# Patient Record
Sex: Female | Born: 1955
Health system: Southern US, Community
[De-identification: ages and names within clinical notes are randomized; demographics above are authoritative.]

## PROBLEM LIST (undated history)

## (undated) DIAGNOSIS — M549 Dorsalgia, unspecified: Secondary | ICD-10-CM

## (undated) DIAGNOSIS — F419 Anxiety disorder, unspecified: Secondary | ICD-10-CM

## (undated) DIAGNOSIS — Z98891 History of uterine scar from previous surgery: Secondary | ICD-10-CM

## (undated) DIAGNOSIS — R51 Headache: Secondary | ICD-10-CM

## (undated) DIAGNOSIS — E7800 Pure hypercholesterolemia, unspecified: Secondary | ICD-10-CM

## (undated) DIAGNOSIS — K219 Gastro-esophageal reflux disease without esophagitis: Secondary | ICD-10-CM

## (undated) DIAGNOSIS — G629 Polyneuropathy, unspecified: Secondary | ICD-10-CM

## (undated) DIAGNOSIS — M797 Fibromyalgia: Secondary | ICD-10-CM

## (undated) DIAGNOSIS — G4733 Obstructive sleep apnea (adult) (pediatric): Secondary | ICD-10-CM

## (undated) DIAGNOSIS — G4730 Sleep apnea, unspecified: Secondary | ICD-10-CM

## (undated) DIAGNOSIS — I10 Essential (primary) hypertension: Secondary | ICD-10-CM

## (undated) DIAGNOSIS — M5430 Sciatica, unspecified side: Secondary | ICD-10-CM

## (undated) DIAGNOSIS — M2550 Pain in unspecified joint: Secondary | ICD-10-CM

## (undated) HISTORY — DX: Sleep apnea, unspecified: G47.30

## (undated) HISTORY — DX: Pain in unspecified joint: M25.50

## (undated) HISTORY — PX: CHOLECYSTECTOMY: SHX55

## (undated) HISTORY — PX: OTHER SURGICAL HISTORY: SHX169

## (undated) HISTORY — DX: Sciatica, unspecified side: M54.30

## (undated) HISTORY — DX: Pure hypercholesterolemia, unspecified: E78.00

## (undated) HISTORY — DX: Dorsalgia, unspecified: M54.9

## (undated) HISTORY — DX: Polyneuropathy, unspecified: G62.9

---

## 1998-10-05 ENCOUNTER — Ambulatory Visit (HOSPITAL_COMMUNITY): Admission: RE | Admit: 1998-10-05 | Discharge: 1998-10-05 | Payer: Self-pay | Admitting: Cardiology

## 1998-11-03 ENCOUNTER — Other Ambulatory Visit: Admission: RE | Admit: 1998-11-03 | Discharge: 1998-11-03 | Payer: Self-pay | Admitting: Obstetrics and Gynecology

## 1998-11-16 ENCOUNTER — Encounter: Payer: Self-pay | Admitting: Obstetrics and Gynecology

## 1998-11-16 ENCOUNTER — Ambulatory Visit (HOSPITAL_COMMUNITY): Admission: RE | Admit: 1998-11-16 | Discharge: 1998-11-16 | Payer: Self-pay | Admitting: Obstetrics and Gynecology

## 1998-11-21 ENCOUNTER — Ambulatory Visit (HOSPITAL_COMMUNITY): Admission: RE | Admit: 1998-11-21 | Discharge: 1998-11-21 | Payer: Self-pay | Admitting: Obstetrics and Gynecology

## 1998-11-21 ENCOUNTER — Encounter: Payer: Self-pay | Admitting: Obstetrics and Gynecology

## 1998-11-25 ENCOUNTER — Ambulatory Visit (HOSPITAL_COMMUNITY): Admission: RE | Admit: 1998-11-25 | Discharge: 1998-11-25 | Payer: Self-pay | Admitting: Obstetrics and Gynecology

## 1999-03-14 ENCOUNTER — Emergency Department (HOSPITAL_COMMUNITY): Admission: EM | Admit: 1999-03-14 | Discharge: 1999-03-14 | Payer: Self-pay | Admitting: Emergency Medicine

## 1999-03-20 ENCOUNTER — Encounter: Payer: Self-pay | Admitting: Emergency Medicine

## 1999-03-20 ENCOUNTER — Emergency Department (HOSPITAL_COMMUNITY): Admission: EM | Admit: 1999-03-20 | Discharge: 1999-03-20 | Payer: Self-pay | Admitting: Emergency Medicine

## 2000-02-13 ENCOUNTER — Encounter: Payer: Self-pay | Admitting: Cardiology

## 2000-02-13 ENCOUNTER — Encounter: Admission: RE | Admit: 2000-02-13 | Discharge: 2000-02-13 | Payer: Self-pay | Admitting: Cardiology

## 2000-06-23 ENCOUNTER — Emergency Department (HOSPITAL_COMMUNITY): Admission: EM | Admit: 2000-06-23 | Discharge: 2000-06-23 | Payer: Self-pay | Admitting: Emergency Medicine

## 2000-07-18 ENCOUNTER — Encounter: Admission: RE | Admit: 2000-07-18 | Discharge: 2000-08-15 | Payer: Self-pay | Admitting: Internal Medicine

## 2000-08-21 ENCOUNTER — Encounter: Admission: RE | Admit: 2000-08-21 | Discharge: 2000-08-21 | Payer: Self-pay | Admitting: Internal Medicine

## 2000-08-21 ENCOUNTER — Encounter: Payer: Self-pay | Admitting: Internal Medicine

## 2000-10-11 ENCOUNTER — Encounter: Admission: RE | Admit: 2000-10-11 | Discharge: 2000-10-11 | Payer: Self-pay | Admitting: Internal Medicine

## 2000-10-11 ENCOUNTER — Encounter: Payer: Self-pay | Admitting: Internal Medicine

## 2002-09-15 ENCOUNTER — Encounter: Admission: RE | Admit: 2002-09-15 | Discharge: 2002-09-15 | Payer: Self-pay | Admitting: Cardiology

## 2002-09-15 ENCOUNTER — Encounter: Payer: Self-pay | Admitting: Cardiology

## 2002-11-25 ENCOUNTER — Encounter: Payer: Self-pay | Admitting: Cardiology

## 2002-11-25 ENCOUNTER — Ambulatory Visit (HOSPITAL_COMMUNITY): Admission: RE | Admit: 2002-11-25 | Discharge: 2002-11-25 | Payer: Self-pay | Admitting: Cardiology

## 2002-12-08 ENCOUNTER — Encounter: Payer: Self-pay | Admitting: Cardiology

## 2002-12-08 ENCOUNTER — Inpatient Hospital Stay (HOSPITAL_COMMUNITY): Admission: EM | Admit: 2002-12-08 | Discharge: 2002-12-11 | Payer: Self-pay

## 2002-12-09 ENCOUNTER — Encounter: Payer: Self-pay | Admitting: Cardiology

## 2002-12-10 ENCOUNTER — Encounter (INDEPENDENT_AMBULATORY_CARE_PROVIDER_SITE_OTHER): Payer: Self-pay | Admitting: Specialist

## 2002-12-23 ENCOUNTER — Emergency Department (HOSPITAL_COMMUNITY): Admission: EM | Admit: 2002-12-23 | Discharge: 2002-12-23 | Payer: Self-pay | Admitting: Emergency Medicine

## 2002-12-25 ENCOUNTER — Encounter: Admission: RE | Admit: 2002-12-25 | Discharge: 2002-12-25 | Payer: Self-pay | Admitting: Orthopedic Surgery

## 2002-12-25 ENCOUNTER — Encounter: Payer: Self-pay | Admitting: Orthopedic Surgery

## 2003-10-28 ENCOUNTER — Encounter (HOSPITAL_COMMUNITY): Admission: RE | Admit: 2003-10-28 | Discharge: 2003-10-28 | Payer: Self-pay | Admitting: Cardiology

## 2004-01-21 ENCOUNTER — Encounter: Admission: RE | Admit: 2004-01-21 | Discharge: 2004-01-21 | Payer: Self-pay | Admitting: Cardiology

## 2004-03-10 ENCOUNTER — Encounter: Admission: RE | Admit: 2004-03-10 | Discharge: 2004-03-10 | Payer: Self-pay | Admitting: Neurosurgery

## 2004-10-09 ENCOUNTER — Ambulatory Visit (HOSPITAL_COMMUNITY): Admission: RE | Admit: 2004-10-09 | Discharge: 2004-10-09 | Payer: Self-pay | Admitting: Cardiology

## 2004-10-11 ENCOUNTER — Ambulatory Visit: Payer: Self-pay | Admitting: Internal Medicine

## 2004-10-11 ENCOUNTER — Ambulatory Visit (HOSPITAL_BASED_OUTPATIENT_CLINIC_OR_DEPARTMENT_OTHER): Admission: RE | Admit: 2004-10-11 | Discharge: 2004-10-11 | Payer: Self-pay | Admitting: Cardiology

## 2004-10-24 ENCOUNTER — Encounter: Admission: RE | Admit: 2004-10-24 | Discharge: 2004-10-24 | Payer: Self-pay | Admitting: Neurosurgery

## 2004-11-08 ENCOUNTER — Encounter: Admission: RE | Admit: 2004-11-08 | Discharge: 2004-11-08 | Payer: Self-pay | Admitting: Neurosurgery

## 2004-11-09 ENCOUNTER — Encounter: Admission: RE | Admit: 2004-11-09 | Discharge: 2004-11-09 | Payer: Self-pay | Admitting: Cardiology

## 2004-11-27 ENCOUNTER — Encounter: Admission: RE | Admit: 2004-11-27 | Discharge: 2004-11-27 | Payer: Self-pay | Admitting: Gastroenterology

## 2004-12-07 ENCOUNTER — Encounter (INDEPENDENT_AMBULATORY_CARE_PROVIDER_SITE_OTHER): Payer: Self-pay | Admitting: Specialist

## 2004-12-07 ENCOUNTER — Ambulatory Visit (HOSPITAL_COMMUNITY): Admission: RE | Admit: 2004-12-07 | Discharge: 2004-12-07 | Payer: Self-pay | Admitting: Gastroenterology

## 2005-12-17 ENCOUNTER — Ambulatory Visit (HOSPITAL_COMMUNITY): Admission: RE | Admit: 2005-12-17 | Discharge: 2005-12-17 | Payer: Self-pay | Admitting: Cardiology

## 2006-06-07 ENCOUNTER — Emergency Department (HOSPITAL_COMMUNITY): Admission: EM | Admit: 2006-06-07 | Discharge: 2006-06-07 | Payer: Self-pay | Admitting: Emergency Medicine

## 2006-12-02 ENCOUNTER — Ambulatory Visit (HOSPITAL_COMMUNITY): Admission: RE | Admit: 2006-12-02 | Discharge: 2006-12-02 | Payer: Self-pay | Admitting: Cardiology

## 2008-04-14 ENCOUNTER — Ambulatory Visit (HOSPITAL_COMMUNITY): Admission: RE | Admit: 2008-04-14 | Discharge: 2008-04-14 | Payer: Self-pay | Admitting: Cardiology

## 2008-09-14 ENCOUNTER — Encounter: Admission: RE | Admit: 2008-09-14 | Discharge: 2008-09-14 | Payer: Self-pay | Admitting: Obstetrics and Gynecology

## 2008-10-05 ENCOUNTER — Encounter: Admission: RE | Admit: 2008-10-05 | Discharge: 2008-10-05 | Payer: Self-pay | Admitting: Obstetrics and Gynecology

## 2008-10-07 ENCOUNTER — Ambulatory Visit (HOSPITAL_COMMUNITY): Admission: RE | Admit: 2008-10-07 | Discharge: 2008-10-07 | Payer: Self-pay | Admitting: Cardiology

## 2009-12-09 ENCOUNTER — Emergency Department (HOSPITAL_BASED_OUTPATIENT_CLINIC_OR_DEPARTMENT_OTHER): Admission: EM | Admit: 2009-12-09 | Discharge: 2009-12-09 | Payer: Self-pay | Admitting: Emergency Medicine

## 2010-07-14 ENCOUNTER — Inpatient Hospital Stay (HOSPITAL_BASED_OUTPATIENT_CLINIC_OR_DEPARTMENT_OTHER): Admission: RE | Admit: 2010-07-14 | Discharge: 2010-07-14 | Payer: Self-pay | Admitting: Cardiology

## 2010-10-22 ENCOUNTER — Encounter: Payer: Self-pay | Admitting: Neurosurgery

## 2010-12-13 LAB — POCT I-STAT GLUCOSE
Glucose, Bld: 344 mg/dL — ABNORMAL HIGH (ref 70–99)
Operator id: 194801

## 2011-02-16 NOTE — Discharge Summary (Signed)
   NAME:  Holly, Daniels                           ACCOUNT NO.:  [id number]   MEDICAL RECORD NO.:  [id number]                   PATIENT TYPE:  INP   LOCATION:  4705                                 FACILITY:  [facility]   PHYSICIAN:  Osvaldo Shipper. Spruill, M.D.             DATE OF BIRTH:  1956-06-09   DATE OF ADMISSION:  12/08/2002  DATE OF DISCHARGE:  12/11/2002                                 DISCHARGE SUMMARY   DISCHARGE DIAGNOSES:  1. Cholelithiasis and cholecystitis.  2. Umbilical hernia.  3. Hypertension.  4. Obesity.  5. Esophageal reflux.   CONSULTANT:  Leonie Man, M.D.   PROCEDURE:  Laparoscopic cholecystectomy, umbilical hernia repair on December 10, 2002.   HISTORY OF PRESENT ILLNESS:  This is a 55 year old patient who presented  initially to the emergency department at [facility] with a  complaint of abdominal pain.  The patient was noted to have sudden onset of  left upper quadrant pain primarily in the chest area.  The patient awoke  with this left-sided chest pain.  It was associated with nausea.  The  patient described this pain as severe.  The patient was evaluated in the  emergency department and found to have normal cardiac enzymes.  The patient  underwent a chest CT, which was negative for pulmonary embolus.  The patient  was subsequently admitted for evaluation.   HOSPITAL COURSE:  The patient was admitted to the medical service.  After  chest evaluation and cardiac enzymes were found to be negative, plans were  made to evaluate the patient from a GI standpoint, in particular, to rule  out the possibility of gallbladder stones or outlet obstruction.  The  patient was seen in GI consultation by Dr. Laural Benes.  The patient underwent a  gallbladder ultrasound, which revealed gallstones and common bile duct  stones with normal liver enzymes.  Plans were made for an ERCP and common  bile duct stone removal.   The patient underwent laparoscopic cholecystectomy  and repair of hernia on  December 10, 2002.  Postoperatively, the patient did well and, on December 11, 2002, it was the opinion of the attending physician as well as the  consulting physician that the patient could be discharged home.   DISCHARGE MEDICATIONS:  Demerol 1-2 q.4-6 h. for pain.   DISCHARGE PLAN:  1. The patient was allowed to resume a normal diet and to resume current     medications.  2. The patient is to see Dr. Lurene Shadow in one week.  3. To see Dr. Shana Chute by appointment.  4. Notify the physician immediately if any change in problems or concerns.     Kathie Dike, P.A.-C                 Osvaldo Shipper Spruill, M.D.   HMB/MEDQ  D:  02/10/2003  T:  02/11/2003  Job:  [id number]

## 2011-02-16 NOTE — Consult Note (Signed)
   NAME:  Holly Daniels, Holly Daniels NO.:  [id number]   MEDICAL RECORD NO.:  [id number]                   PATIENT TYPE:  INP   LOCATION:                                       FACILITY:  [facility]   PHYSICIAN:  Danise Edge, M.D.                DATE OF BIRTH:  10-08-1955   DATE OF CONSULTATION:  12/09/2002  DATE OF DISCHARGE:                                   CONSULTATION   HISTORY OF PRESENT ILLNESS:  The patient is a 55 year old female born  1956/03/19. The patient was awakened at 6 a.m. December 08, 2002, with very  severe, steady epigastric, left upper quadrant abdominal pain and left-sided  chest pain. The pain lasted approximately 3 hours. After receiving  morphine  her pain completely resolved to the point that she feels fine. She  has had  no similar attacks of pain. Her pain was associated with nausea and vomiting  and urinary incontinence. She reports on bowel urgency or gastrointestinal  bleeding. Her heart burn is well controlled on Nexium.   ALLERGIES:  No medication allergies.   CHRONIC MEDICATIONS:  1. Lopressor.  2. Protonix.  3. Tylenol.  4. Ativan.  5. Morphine in the hospital.  6. Phenergan.  7. Restoril.  8. Aspirin.   PAST MEDICAL HISTORY:  1. Hypertension.  2. Remote cesarean section.  3. Fibromyalgia syndrome.  4. Gastroesophageal reflux disease.   HABITS:  The patient does smoke cigarettes but does not consume alcohol.   PHYSICAL EXAMINATION:  GENERAL:  The patient feels fine. She is alert and  comfortable.  HEENT:  Sclerae anicteric. Oropharynx normal.  LUNGS:  Clear to auscultation.  CARDIAC:  Regular rhythm without murmurs.  ABDOMEN:  Soft, flat and nontender.    LABORATORY DATA:  Admission CBC was normal except for a mildly elevated  white blood cell count. Admission complete metabolic profile was normal  except for a slightly elevated random glucose 165. Amylase and lipase were  normal. Urinalysis was normal.   An  abdominal ultrasound reveals gallstones and a possible common bile duct  stone.                                                 Danise Edge, M.D.    MJ/MEDQ  D:  12/09/2002  T:  12/09/2002  Job:  [id number]

## 2011-02-16 NOTE — Op Note (Signed)
   NAME:  Holly Daniels, Holly Daniels                           ACCOUNT NO.:  [id number]   MEDICAL RECORD NO.:  [id number]                   PATIENT TYPE:  INP   LOCATION:  4705                                 FACILITY:  [facility]   PHYSICIAN:  Danise Edge, M.D.                DATE OF BIRTH:  January 24, 1956   DATE OF PROCEDURE:  DATE OF DISCHARGE:                                 OPERATIVE REPORT   PROCEDURE:  Endoscopic retrograde cholangiogram.   ENDOSCOPIST:  Danise Edge, M.D.   PREMEDICATION:  Versed 10 mg, Demerol 50 mg.   DESCRIPTION OF PROCEDURE:  After informed consent was obtained and  discussing the risks associated with ERCP with endoscopic sphincterotomy and  stone removal, the patient was placed in the prone position in the  fluoroscopy table.  I administered intravenous Demerol and intravenous  Versed to achieve conscious sedation for the procedure. The patient's blood  pressure, O2 saturation and cardiac rhythm were monitored throughout the  procedure and documented in the medical record.   The Olympus duodenoscope was passed through the posterior hypopharynx down  the esophagus and into the stomach without examining the esophagus. The  endoscope was then advanced through a normal appearing pylorus without  examining the stomach. The endoscope was advanced to the second portion of  the duodenum without examining the duodenal bulb.   Major papilla:  The major papilla was easily identified, draining straw-  colored bile. The major papilla had a normal endoscopic appearance.   Pancreatogram:  The pancreatic duct was not cannulated or injected with  contrast.   Cholangiogram:  The common bile duct was selectively cannulated with the  wire-loaded sphincterotome. The biliary system was filled with contrast.  Examination of the intrahepatic and extrahepatic bile ducts revealed no  filling defects, tumors or strictures. An endoscopic sphincterotomy was not  performed.    ASSESSMENT:   Normal endoscopic retrograde cholangiogram.   RECOMMENDATIONS:  Proceed with laparoscopic cholecystectomy.                                               Danise Edge, M.D.    MJ/MEDQ  D:  12/09/2002  T:  12/09/2002  Job:  [id number]   cc:   Osvaldo Shipper. Spruill, M.D.  [address] [id number]  [place]  [place] [address]  Fax: [phone]

## 2011-02-16 NOTE — Op Note (Signed)
NAME:  Holly Daniels, Holly Daniels                 ACCOUNT NO.:  [id number]   MEDICAL RECORD NO.:  [id number]          PATIENT TYPE:  AMB   LOCATION:  ENDO                         [facility]AC[facility]  [facility]   PHYSICIAN:  Graylin Shiver, M.D.   DATE OF BIRTH:  19-Nov-1955   DATE OF PROCEDURE:  12/07/2004  DATE OF DISCHARGE:                                 OPERATIVE REPORT   PROCEDURE:  Esophagogastroduodenoscopy with biopsy.   INDICATIONS FOR PROCEDURE:  Right-sided abdominal pain, etiology unclear.  History of reflux.   INFORMED CONSENT:  Informed consent was obtained after explanation of the  risks of bleeding, infection, and perforation.   PREMEDICATIONS:  Fentanyl 75 mcg IV, versed 6 mg IV.   PROCEDURE:  With the patient in a left lateral decubitus position, the  Olympus gastroscope was inserted into the oropharynx and passed into the  esophagus.  It was advanced down the esophagus and then into the stomach and  into the duodenum.  The second portion and bulb of the duodenum looked  normal.  The stomach showed a diffuse gastritis.  Biopsies were obtained.  The fundus and cardia looked unremarkable on retroflexion.  The esophagus  looked normal.  She tolerated the procedure well without complications.   IMPRESSION:  Diffuse gastritis.   PLAN:  The biopsies will be checked to look for any evidence of Helicobacter  pylori.  The patient will remain on Nexium.       ___________________________________________  Graylin Shiver, M.D.    SFG/MEDQ  D:  12/07/2004  T:  12/07/2004  Job:  [id number]   cc:   Osvaldo Shipper. Spruill, M.D.  [address] [address]  [place]  [place] [address]  Fax: [phone]

## 2011-02-16 NOTE — Op Note (Signed)
NAME:  Holly Daniels, Holly Daniels                           ACCOUNT NO.:  [id number]   MEDICAL RECORD NO.:  [id number]                   PATIENT TYPE:  INP   LOCATION:  4705                                 FACILITY:  [facility]   PHYSICIAN:  Leonie Man, M.D.                DATE OF BIRTH:  20-May-1956   DATE OF PROCEDURE:  12/10/2002  DATE OF DISCHARGE:                                 OPERATIVE REPORT   PREOPERATIVE DIAGNOSIS:  1. Acute cholecystitis.  2. Umbilical hernia.   POSTOPERATIVE DIAGNOSIS:  1. Acute cholecystitis.  2. Umbilical hernia.   OPERATION PERFORMED:  1. Laparoscopic cholecystectomy.  2. Repair of umbilical hernia.   SURGEON:  Leonie Man, M.D.   ASSISTANT:  Joanne Gavel, M.D.   ANESTHESIA:  General.   INDICATIONS FOR PROCEDURE:  The patient is a 55 year old woman presenting  with severe epigastric and left upper quadrant pain associated nausea and  vomiting.  No fever or leukocytosis, no jaundice or elevated function  studies.  Ultrasound showed a gallbladder with multiple stones, also showed  what was felt to be a common duct stone.  She underwent ERCP and EGD which  showed no evidence of stones within the common bile duct.  She comes to the  operating room now after the risks and potential benefits of surgery have  been fully discussed, all questions answered and consent obtained.   DESCRIPTION OF PROCEDURE:  Following induction of satisfactory general  anesthesia, with the patient positioned supinely, the abdomen was prepped  and draped to be included in a sterile operative field.  We created open  laparoscopy.  Upon opening the skin, there was a very large  umbilical/ventral hernia just below the umbilicus.  This defect was used to  insert the Hasson cannula and the peritoneum was insufflated to 14 mmHg  pressure using carbon dioxide.  The camera was inserted.  Visual exploration  of the abdomen carried out.  The gallbladder was extremely tense and  hydroped.   The liver edge was sharp and the liver surface was smooth.  A lot  of scarring around the ampulla of the gallbladder.  The anterior gastric  wall and small intestines appeared normal.  No pelvic organs were  visualized.   Under direct vision, epigastric and lateral ports were placed.  The  gallbladder was then aspirated of its contents using a laparoscopic  aspirator aspirating very thick black bile from the gallbladder.  The  gallbladder then grasped and retracted cephalad.  Dissection carried down to  the region of the ampulla with identification and isolation of the cystic  artery which was traced up to the gallbladder wall and the cystic duct was  traced up to the gallbladder cystic duct junction and down to the cystic  duct common duct junction.  The cystic duct and cystic arteries were doubly  clipped and transected.  The gallbladder was then dissected free  from the  liver bed using electrocautery and maintaining hemostasis throughout the  course of the dissection.  At the end of the dissection, the liver bed was  again treated with electrocautery for bleeding control and then packed with  a Surgicel gauze.  The gallbladder was then retrieved into an Endo pouch and  removed from the abdomen through the umbilical port.  Pneumoperitoneum was  allowed to deflate.  Sponge, instrument and sharp counts were then verified.  Attention was then turned to the umbilical hernia which was repaired  primarily with interrupted sutures of 0 Novofil, placed in inverted mattress  fashion.  The skin of the wounds was then closed with running 4-0 Monocryl  sutures.  All wounds were reinforced with Steri-Strips and a sterile  dressing was applied.                                               Leonie Man, M.D.    PB/MEDQ  D:  12/10/2002  T:  12/10/2002  Job:  [id number]

## 2011-02-16 NOTE — Procedures (Signed)
NAME:  Holly Daniels, Holly Daniels                 ACCOUNT NO.:  [id number]   MEDICAL RECORD NO.:  [id number]          PATIENT TYPE:  OUT   LOCATION:  SLEEP CENTER                 FACILITY:  [facility]   PHYSICIAN:  Clinton D. Maple Hudson, M.D. DATE OF BIRTH:  Apr 07, 1956   DATE OF STUDY:  10/11/2004                              NOCTURNAL POLYSOMNOGRAM   STUDY DATE:  October 11, 2004   REFERRING PHYSICIAN:  Dr. Kevin Fenton Spruill   INDICATIONS FOR STUDY:  Hypersomnia with sleep apnea.  Epworth sleepiness  score 13/24.  BMI 38.  Weight 223 pounds.   SLEEP ARCHITECTURE:  Total sleep time 325 minutes with sleep efficiency 73%.  Stage I 4%, stage II 56%, stages III and IV were absent, REM was 14% of  total sleep time.  Sleep latency 72 minutes,  REM latency 192 minutes, awake  after sleep onset 48 minutes, arousal index 46.  She indicated anxiety  related to sleeping, requested light be left on initially but later turned  it off.  She kept television on throughout the night.   RESPIRATORY DATA:  RDI 46.5/hr indicating severe obstructive sleep  apnea/hypopnea syndrome.  This included 5 obstructive apneas, 1 mixed apnea,  and 246 hypopneas.  Events were not positional.  REM RDI 65/hr.  Sleep was  very fragmented and she could not maintain sleep sufficiently to trigger the  CPAP titration protocol.   OXYGEN DATA:  Moderate snoring with oxygen desaturation to a nadir of 78% on  room air.  Mean oxygen saturation was 89-91% on room air through the study.   CARDIAC DATA:  Normal sinus rhythm with rare PVC.   MOVEMENT/PARASOMNIA:  Insignificant.   IMPRESSION/RECOMMENDATION:  Severe obstructive sleep apnea/hypopnea  syndrome, RDI of 46.5/hr with oxygen desaturation to 78% and moderate  snoring.  She was unable to maintain sleep sufficiently to permit use of the  continuous positive airway pressure titration protocol.  Consider return for  CPAP titration bringing a sleep medication if appropriate.                                          Clinton D. Maple Hudson, M.D.  Diplomate, [employer] Board   CDY/MEDQ  D:  10/15/2004 11:16:10  T:  10/15/2004 18:20:43  Job:  [id number]

## 2011-08-20 ENCOUNTER — Other Ambulatory Visit: Payer: Self-pay | Admitting: Cardiology

## 2011-09-05 ENCOUNTER — Other Ambulatory Visit: Payer: Self-pay | Admitting: Cardiology

## 2011-11-30 ENCOUNTER — Other Ambulatory Visit (INDEPENDENT_AMBULATORY_CARE_PROVIDER_SITE_OTHER): Payer: 59

## 2011-11-30 ENCOUNTER — Encounter: Payer: 59 | Admitting: Obstetrics and Gynecology

## 2011-11-30 DIAGNOSIS — N950 Postmenopausal bleeding: Secondary | ICD-10-CM

## 2011-11-30 DIAGNOSIS — E569 Vitamin deficiency, unspecified: Secondary | ICD-10-CM

## 2011-12-19 ENCOUNTER — Encounter (INDEPENDENT_AMBULATORY_CARE_PROVIDER_SITE_OTHER): Payer: 59 | Admitting: Obstetrics and Gynecology

## 2011-12-19 DIAGNOSIS — N950 Postmenopausal bleeding: Secondary | ICD-10-CM

## 2011-12-19 DIAGNOSIS — A599 Trichomoniasis, unspecified: Secondary | ICD-10-CM

## 2011-12-24 ENCOUNTER — Other Ambulatory Visit: Payer: Self-pay | Admitting: Cardiology

## 2012-01-02 ENCOUNTER — Encounter (HOSPITAL_COMMUNITY): Payer: Self-pay | Admitting: Pharmacist

## 2012-01-02 ENCOUNTER — Other Ambulatory Visit: Payer: Self-pay | Admitting: Obstetrics and Gynecology

## 2012-01-04 ENCOUNTER — Encounter (HOSPITAL_COMMUNITY): Payer: Self-pay

## 2012-01-04 ENCOUNTER — Encounter (HOSPITAL_COMMUNITY)
Admission: RE | Admit: 2012-01-04 | Discharge: 2012-01-04 | Disposition: A | Discharge status: Home / Self Care | Payer: 59 | Source: Ambulatory Visit | Attending: Obstetrics and Gynecology | Admitting: Obstetrics and Gynecology

## 2012-01-04 ENCOUNTER — Other Ambulatory Visit: Payer: Self-pay

## 2012-01-04 HISTORY — DX: Headache: R51

## 2012-01-04 HISTORY — DX: Anxiety disorder, unspecified: F41.9

## 2012-01-04 HISTORY — DX: Gastro-esophageal reflux disease without esophagitis: K21.9

## 2012-01-04 HISTORY — DX: History of uterine scar from previous surgery: Z98.891

## 2012-01-04 HISTORY — DX: Fibromyalgia: M79.7

## 2012-01-04 HISTORY — DX: Essential (primary) hypertension: I10

## 2012-01-04 LAB — CBC
Hemoglobin: 15.2 g/dL — ABNORMAL HIGH (ref 12.0–15.0)
MCH: 30.2 pg (ref 26.0–34.0)
MCHC: 34.5 g/dL (ref 30.0–36.0)
RDW: 13.9 % (ref 11.5–15.5)

## 2012-01-04 LAB — BASIC METABOLIC PANEL
BUN: 16 mg/dL (ref 6–23)
Calcium: 10.3 mg/dL (ref 8.4–10.5)
Creatinine, Ser: 0.73 mg/dL (ref 0.50–1.10)
GFR calc Af Amer: >90 mL/min (ref >90)
GFR calc non Af Amer: >90 mL/min (ref >90)
Glucose, Bld: 214 mg/dL — ABNORMAL HIGH (ref 70–99)

## 2012-01-04 NOTE — Pre-Procedure Instructions (Signed)
Printed history and EKG reviewed by Brayton Caves, MD. Pt instructed to see primary/cardiologist for surgical clearance for diabetic control and cardiac clearance.  Patton Salles RN

## 2012-01-04 NOTE — Patient Instructions (Addendum)
[address]  01/04/2012   Your procedure is scheduled on:  01/14/12  Enter through the Main Entrance of [facility] at 1 PM  Pick up the phone at the desk and dial [phone].   Call this number if you have problems the morning of surgery: [phone]   Remember:   Do not eat food:After Midnight.  Do not drink clear liquids: 4 Hours before arrival.  Take these medicines the morning of surgery with A SIP OF WATER: Take all blood pressure medications, may take Xanax if needed. Hold diabetic medications for 24hrs prior to surgery, take Protonix    Do not wear jewelry, make-up or nail polish.  Do not wear lotions, powders, or perfumes. You may wear deodorant.  Do not shave 48 hours prior to surgery.  Do not bring valuables to the hospital.  Contacts, dentures or bridgework may not be worn into surgery.  Leave suitcase in the car. After surgery it may be brought to your room.  For patients admitted to the hospital, checkout time is 11:00 AM the day of discharge.   Patients discharged the day of surgery will not be allowed to drive home.  Name and phone number of your driver: NA  Special Instructions: CHG Shower Use Special Wash: 1/2 bottle night before surgery and 1/2 bottle morning of surgery.   Please read over the following fact sheets that you were given: Surgical Site Infection Prevention

## 2012-01-08 ENCOUNTER — Encounter: Payer: Self-pay | Admitting: Obstetrics and Gynecology

## 2012-01-08 ENCOUNTER — Ambulatory Visit (INDEPENDENT_AMBULATORY_CARE_PROVIDER_SITE_OTHER): Payer: 59 | Admitting: Obstetrics and Gynecology

## 2012-01-08 VITALS — BP 106/70 | HR 76 | Temp 98.6°F | Resp 16 | Ht 63.5 in | Wt 195.0 lb

## 2012-01-08 DIAGNOSIS — IMO0001 Reserved for inherently not codable concepts without codable children: Secondary | ICD-10-CM

## 2012-01-08 DIAGNOSIS — F419 Anxiety disorder, unspecified: Secondary | ICD-10-CM | POA: Insufficient documentation

## 2012-01-08 DIAGNOSIS — N950 Postmenopausal bleeding: Secondary | ICD-10-CM

## 2012-01-08 DIAGNOSIS — K589 Irritable bowel syndrome without diarrhea: Secondary | ICD-10-CM | POA: Insufficient documentation

## 2012-01-08 DIAGNOSIS — K219 Gastro-esophageal reflux disease without esophagitis: Secondary | ICD-10-CM

## 2012-01-08 DIAGNOSIS — Z780 Asymptomatic menopausal state: Secondary | ICD-10-CM | POA: Insufficient documentation

## 2012-01-08 DIAGNOSIS — I10 Essential (primary) hypertension: Secondary | ICD-10-CM

## 2012-01-08 DIAGNOSIS — M797 Fibromyalgia: Secondary | ICD-10-CM

## 2012-01-08 DIAGNOSIS — F411 Generalized anxiety disorder: Secondary | ICD-10-CM

## 2012-01-08 DIAGNOSIS — G4730 Sleep apnea, unspecified: Secondary | ICD-10-CM

## 2012-01-08 DIAGNOSIS — E119 Type 2 diabetes mellitus without complications: Secondary | ICD-10-CM | POA: Insufficient documentation

## 2012-01-08 NOTE — Progress Notes (Signed)
Subjective:    Holly Daniels is a 56 y.o. female para 2-0-0-2 scheduled for a  hysterscopy D& C for post menopausal bleeding. Patient has been menopausal since her  mid-forties but began bleeding 1 month ago, daily with mild cramping. During that time she changed a pad 3 times a day but finally her bleeding stopped two week ago.  She admits to lower back pain but thinks this may be due to her fibromyalgia. She denies vaginitis symptoms, urinary tract symptoms or changes in her bowel movements.  A pelvic ultrasound 11/2011 showed a uterus measuring 6.01 x 3.42 x 2.70 cm, endometrium 0.477cm with normal appearing ovaries.  An endometrial biopsy returned:sparse inflamed fragments of benign endocervical mucosa in a background of mucus. No diagnostic endometrial tissue was identified.  A review of medical and surgical management options were given, including observation but patient wishes to proceed with hysteroscopy with D & C. .   Last Pap: March  2013- normal except for trichomoniasis   Menstrual History: OB History    Grav Para Term Preterm Abortions TAB SAB Ect Mult Living   2 0 0    0   2     C-sections 1975 & 64 Menarche age: 56 yo No LMP recorded. Patient is postmenopausal. no h/o abnormal PAP; h/o trichomoniasis  Medical Hx. 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   Objective:     BP 106/70  Pulse 76  Temp(Src) 98.6 F (37 C) (Oral)  Resp 16  Ht 5' 3.5" (1.613 m)  Wt 195 lb (88.451 kg)  BMI 34.00 kg/m2 Weight:  Wt Readings from Last 1 Encounters:  01/08/12 195 lb (88.451 kg)   BMI: Body mass index is 34.00 kg/(m^2). General Appearance: Alert, appropriate appearance for age. No acute distress  Neck / Thyroid: Supple, no masses, nodes or enlargement Lungs: clear to auscultation bilaterally Back: No CVA tenderness Cardiovascular: Regular rate and rhythm. S1, S2, no murmur Gastrointestinal: Soft, non-tender, no masses or organomegaly Pelvic Exam: Vulva and vagina appear normal. Bimanual exam reveals normal uterus and adnexa.    Assessment:    Post-menopausal vaginal bleeding    Plan:    Hysteroscopy D & C  [facility], 01/14/2012 @ 2:30 pm with Dr. Su Hilt.  Reviewed risks of surgery to include, but not limited to reaction to anesthesia, damage to adjacent organs, infection and excessive bleeding.  Follow-up:  3 weeks   Dictation # [id number]

## 2012-01-09 NOTE — H&P (Signed)
Holly Daniels, Holly Daniels NO.:  [id number]  MEDICAL RECORD NO.:  [id number]  LOCATION:  [facility]                           FACILITY:  [facility]  PHYSICIAN:  Osborn Coho, M.D.   DATE OF BIRTH:  07-14-1956  DATE OF ADMISSION:  01/04/2012 DATE OF DISCHARGE:  01/04/2012                             HISTORY & PHYSICAL   HISTORY OF PRESENT ILLNESS:  Holly Daniels is a 56 year old married African-American female, para 2-0-0-2, presenting for hysteroscopy and D and C because of postmenopausal bleeding.  The patient has been menopausal since her mid- 69s but began bleeding approximately 7 weeks ago.  The patient reports that this bleeding occurred daily, with mild cramping and required her to change a pad 3 times daily.  Finally, 2 weeks ago, patient's bleeding stopped.  She admits to lower back pain, but thinks this may be due to her fibromyalgia.  She denies, any vaginitis symptoms, urinary tract symptoms or changes in her bowel movements.  A pelvic ultrasound in March, 2013 showed a uterus measuring 6.01 x 3.42 x 2.70 cm with endometrium measuring 0.477 cm and normal-appearing ovaries.  An endometrial biopsy done at that same time returned sparse inflamed fragments of benign endocervical mucosa in a background of mucous.  There was no diagnostic endometrial tissue identified.  A review of both medical and surgical management options were given to the patient to include observation, but she wishes to proceed with hysteroscopy and D and C.  OB HISTORY: Gravida 2, para 2-0-0-2.  The patient underwent a cesarean section in 1975 and 1981.  GYN HISTORY:  Menarche 56 years old.  The patient is post menopausal. She denies a history of abnormal Pap smears.  She does have a history of trichomoniasis. Her most recent Pap smear was March, 2013, was normal with the exception of trichomoniasis being found on that smear.  PAST MEDICAL HISTORY:  Diabetes mellitus, hypertension, sleep  apnea (patient has A C-Pap machine), fibromyalgia, anxiety, vitamin D deficiency, gastroesophageal reflux disease, and irritable bowel syndrome.  SOCIAL HISTORY:  In 2005 cholecystectomy, 2000 vulvar excision of a cyst, 2011 vulvar excision of a polyp, 2011 cardiac catheterization with no coronary artery disease found. She denies any history of blood transfusions or problems with anesthesia.  FAMILY HISTORY:  Colon cancer-father, breast cancer-2 sisters, brain Cancer-sister, and hypertension.  SOCIAL HISTORY:  The patient is employed as a Theatre manager. She is married and caring for a seriously ill husband.  HABITS:  She smokes 1 pack of cigarettes per week.  Denies any alcohol or illicit drug use.  CURRENT MEDICATIONS:  Xanax 1 mg daily, vitamin D 3000 units daily, Norvasc 5 mg daily, aspirin 81 mg daily, Vytorin 10/40 daily, Amaryl 4 mg daily, ibuprofen 200 mg as needed daily,  Zestoretic 20/12.5 daily, metformin 1000 mg daily, Prilosec 20 mg daily, Afrin nasal spray as needed.  ALLERGIES:  The patient has no known drug allergies.  however, she does have a sensitivity to CODEINE that causes nausea and vomiting.  She is also LACTOSE intolerant.  She denies any sensitivities to SHELLFISH, SOY, PEANUTS or LATEX.  REVIEW OF SYSTEMS:  The patient wears glasses, has  removable partial dental plates of her upper and lower jaws, has a history of chest pain With shortness of breath whenever she smokes a lot, has chronic loose bowel movements since her gallbladder surgery,and has myalgias attributed to her fibromyalgia; she denies any headache, vision changes, joint swelling, skin rashes, fever or night sweats.  PHYSICAL EXAMINATION:  VITAL SIGNS:  Blood pressure is 106/70, pulse is 76, respirations 16, temperature was 98.6 degrees Fahrenheit orally, weight 195 pounds, height 5 feet, 3-1/2 inches tall, body mass index 34. NECK:  Supple without masses.  There is no  thyromegaly or cervical adenopathy. HEART:  Regular rate and rhythm. LUNGS:  Clear. BACK: No CVA tenderness. ABDOMEN:  There is no tenderness, guarding, rebound, palpable masses, or organomegaly. PELVIC: EGBUS is normal.  Vagina is normal.  Cervix is nontender without lesions.  Uterus appears normal size, shape and consistency.  There is no adnexal tenderness or masses.  IMPRESSION:  Postmenopausal bleeding.  DISPOSITION:  A discussion was held with the patient regarding indications for her procedures along with their risks, which include, but are not limited to, reaction to anesthesia, damage to adjacent organs, infection, and excessive bleeding.  The patient verbalized understanding of these risks and has consented to proceed with hysteroscopy and D and C at [facility] on January 14, 2012 at 2:30 p.m.     Holly Daniels, P.A.-C  ______________________________ Osborn Coho, M.D.    EJP/MEDQ  D:  01/08/2012  T:  01/09/2012  Job:  [id number]  No change in plan of care. H&P Reviewed.

## 2012-01-13 ENCOUNTER — Encounter: Payer: Self-pay | Admitting: Obstetrics and Gynecology

## 2012-01-14 ENCOUNTER — Ambulatory Visit (HOSPITAL_COMMUNITY)
Admission: RE | Admit: 2012-01-14 | Discharge: 2012-01-14 | Disposition: A | Discharge status: Home / Self Care | Payer: 59 | Source: Ambulatory Visit | Attending: Obstetrics and Gynecology | Admitting: Obstetrics and Gynecology

## 2012-01-14 ENCOUNTER — Encounter (HOSPITAL_COMMUNITY)
Admission: RE | Disposition: A | Discharge status: Home / Self Care | Payer: Self-pay | Source: Ambulatory Visit | Attending: Obstetrics and Gynecology

## 2012-01-14 ENCOUNTER — Ambulatory Visit (HOSPITAL_COMMUNITY): Payer: 59 | Admitting: Anesthesiology

## 2012-01-14 ENCOUNTER — Encounter (HOSPITAL_COMMUNITY): Payer: Self-pay | Admitting: Anesthesiology

## 2012-01-14 ENCOUNTER — Encounter (HOSPITAL_COMMUNITY): Payer: Self-pay | Admitting: *Deleted

## 2012-01-14 DIAGNOSIS — N950 Postmenopausal bleeding: Secondary | ICD-10-CM | POA: Insufficient documentation

## 2012-01-14 DIAGNOSIS — Z01812 Encounter for preprocedural laboratory examination: Secondary | ICD-10-CM | POA: Insufficient documentation

## 2012-01-14 DIAGNOSIS — D250 Submucous leiomyoma of uterus: Secondary | ICD-10-CM | POA: Insufficient documentation

## 2012-01-14 DIAGNOSIS — Z01818 Encounter for other preprocedural examination: Secondary | ICD-10-CM | POA: Insufficient documentation

## 2012-01-14 HISTORY — PX: HYSTEROSCOPY WITH D & C: SHX1775

## 2012-01-14 LAB — GLUCOSE, CAPILLARY: Glucose-Capillary: 152 mg/dL — ABNORMAL HIGH (ref 70–99)

## 2012-01-14 SURGERY — DILATATION AND CURETTAGE /HYSTEROSCOPY
Anesthesia: Monitor Anesthesia Care | Site: Vagina | Wound class: Clean Contaminated

## 2012-01-14 MED ORDER — MIDAZOLAM HCL 5 MG/5ML IJ SOLN
INTRAMUSCULAR | Status: DC | PRN
  Administered 2012-01-14: 2 mg via INTRAVENOUS

## 2012-01-14 MED ORDER — LIDOCAINE HCL 1 % IJ SOLN
INTRAMUSCULAR | Status: DC | PRN
  Administered 2012-01-14: 20 mL

## 2012-01-14 MED ORDER — MIDAZOLAM HCL 2 MG/2ML IJ SOLN
INTRAMUSCULAR | Status: AC
  Filled 2012-01-14: qty 2

## 2012-01-14 MED ORDER — PANTOPRAZOLE SODIUM 40 MG PO TBEC
40.0000 mg | DELAYED_RELEASE_TABLET | Freq: Once | ORAL | Status: AC
  Administered 2012-01-14: 40 mg via ORAL

## 2012-01-14 MED ORDER — KETOROLAC TROMETHAMINE 30 MG/ML IJ SOLN
INTRAMUSCULAR | Status: AC
  Filled 2012-01-14: qty 1

## 2012-01-14 MED ORDER — LACTATED RINGERS IV SOLN
INTRAVENOUS | Status: DC
  Administered 2012-01-14 (×2): via INTRAVENOUS

## 2012-01-14 MED ORDER — PROPOFOL 10 MG/ML IV EMUL
INTRAVENOUS | Status: AC
  Filled 2012-01-14: qty 20

## 2012-01-14 MED ORDER — PROPOFOL 10 MG/ML IV EMUL
INTRAVENOUS | Status: DC | PRN
  Administered 2012-01-14 (×2): 10 mg via INTRAVENOUS
  Administered 2012-01-14: 20 mg via INTRAVENOUS
  Administered 2012-01-14: 30 mg via INTRAVENOUS

## 2012-01-14 MED ORDER — FENTANYL CITRATE 0.05 MG/ML IJ SOLN
INTRAMUSCULAR | Status: AC
  Filled 2012-01-14: qty 2

## 2012-01-14 MED ORDER — IBUPROFEN 600 MG PO TABS
600.0000 mg | ORAL_TABLET | Freq: Four times a day (QID) | ORAL | Status: AC | PRN
Start: 2012-01-14 — End: 2012-01-24

## 2012-01-14 MED ORDER — PANTOPRAZOLE SODIUM 40 MG PO TBEC
DELAYED_RELEASE_TABLET | ORAL | Status: AC
  Administered 2012-01-14: 40 mg via ORAL
  Filled 2012-01-14: qty 1

## 2012-01-14 MED ORDER — HYDROMORPHONE HCL 2 MG PO TABS: 2.0000 mg | ORAL_TABLET | Freq: Four times a day (QID) | ORAL | Status: AC | PRN

## 2012-01-14 MED ORDER — FENTANYL CITRATE 0.05 MG/ML IJ SOLN
INTRAMUSCULAR | Status: AC
  Administered 2012-01-14: 50 ug via INTRAVENOUS
  Filled 2012-01-14: qty 2

## 2012-01-14 MED ORDER — FENTANYL CITRATE 0.05 MG/ML IJ SOLN
25.0000 ug | INTRAMUSCULAR | Status: DC | PRN
  Administered 2012-01-14: 50 ug via INTRAVENOUS

## 2012-01-14 MED ORDER — KETOROLAC TROMETHAMINE 30 MG/ML IJ SOLN
INTRAMUSCULAR | Status: DC | PRN
  Administered 2012-01-14: 15 mg via INTRAVENOUS

## 2012-01-14 MED ORDER — KETOROLAC TROMETHAMINE 30 MG/ML IJ SOLN: 15.0000 mg | Freq: Once | INTRAMUSCULAR | Status: DC | PRN

## 2012-01-14 MED ORDER — GLYCINE 1.5 % IR SOLN
Status: DC | PRN
  Administered 2012-01-14: 3000 mL

## 2012-01-14 MED ORDER — FENTANYL CITRATE 0.05 MG/ML IJ SOLN
INTRAMUSCULAR | Status: DC | PRN
  Administered 2012-01-14: 50 ug via INTRAVENOUS

## 2012-01-14 SURGICAL SUPPLY — 15 items
CANISTER SUCTION 2500CC (MISCELLANEOUS) ×2 IMPLANT
CATH ROBINSON RED A/P 16FR (CATHETERS) ×2 IMPLANT
CLOTH BEACON ORANGE TIMEOUT ST (SAFETY) ×2 IMPLANT
CONTAINER PREFILL 10% NBF 60ML (FORM) ×4 IMPLANT
ELECT REM PT RETURN 9FT ADLT (ELECTROSURGICAL)
ELECTRODE REM PT RTRN 9FT ADLT (ELECTROSURGICAL) IMPLANT
GLOVE BIO SURGEON STRL SZ7.5 (GLOVE) ×4 IMPLANT
GLOVE BIOGEL PI IND STRL 7.5 (GLOVE) ×1 IMPLANT
GLOVE BIOGEL PI INDICATOR 7.5 (GLOVE) ×1
GOWN PREVENTION PLUS LG XLONG (DISPOSABLE) ×2 IMPLANT
GOWN STRL REIN XL XLG (GOWN DISPOSABLE) ×2 IMPLANT
LOOP ANGLED CUTTING 22FR (CUTTING LOOP) IMPLANT
PACK HYSTEROSCOPY LF (CUSTOM PROCEDURE TRAY) ×2 IMPLANT
TOWEL OR 17X24 6PK STRL BLUE (TOWEL DISPOSABLE) ×4 IMPLANT
WATER STERILE IRR 1000ML POUR (IV SOLUTION) ×2 IMPLANT

## 2012-01-14 NOTE — Op Note (Signed)
Preop Diagnosis: Post Menopausal Bleeding   Postop Diagnosis: post menopausal bleeding   Procedure: DILATATION AND CURETTAGE /HYSTEROSCOPY   Anesthesia: MAC   Anesthesiologist: Dana Allan, MD   Attending: Purcell Nails, MD   Assistant: n/a  Findings: Questionable submucosal fibroid on posterior wall of uterus.  Pathology: Endometrial Curettings  Fluids: 800 cc  UOP: QS via straight cath prior to procedure  EBL: Minimal  Complications: None  Procedure:The patient was taken to the operating room after the risks, benefits and alternatives were discussed with the patient. The patient verbalized understanding and consent signed and witnessed. The patient was placed under general anesthesia with an LMA per anesthesiologist and prepped and draped in the normal sterile fashion.  Time Out was performed per protocol.  A bivalve speculum was placed in the patient's vagina and the anterior lip of the cervix was grasped with a single tooth tenaculum. A paracervical block was administered using a total of 20 cc of 1% lidocaine. The uterus sounded to 9 cm. The cervix was dilated for passage of the hysteroscope.  The hysteroscope was introduced into the uterine cavity and findings as noted above. Sharp curettage was performed until a gritty texture was noted and curettings were sent to pathology. The hysteroscope was reintroduced and no obvious remaining intracavitary lesions were noted.  All instruments were removed. Sponge lap and needle count was correct. The patient tolerated the procedure well and was returned to the recovery room in good condition.

## 2012-01-14 NOTE — Anesthesia Preprocedure Evaluation (Signed)
Anesthesia Evaluation  Patient identified by MRN, date of birth, ID band Patient awake    Reviewed: Allergy & Precautions, H&P , NPO status , Patient's Chart, lab work & pertinent test results, reviewed documented beta blocker date and time   History of Anesthesia Complications Negative for: history of anesthetic complications  Airway Mallampati: II TM Distance: >3 FB Neck ROM: full    Dental  (+) Edentulous Upper   Pulmonary sleep apnea (doesn't use her CPAP machine) , Current Smoker,  breath sounds clear to auscultation  Pulmonary exam normal       Cardiovascular hypertension, + CAD (mild, cath from 2011 showed EF 55-60%, mild diffuse disease) Rhythm:regular Rate:Normal  Note from cardiologist 01/07/12 says EKG is consistent with prior   Neuro/Psych PSYCHIATRIC DISORDERS (anxiety and fibromyalgia)  Neuromuscular disease (fibromyalgia)    GI/Hepatic negative GI ROS, Neg liver ROS, GERD-  ,  Endo/Other  Diabetes mellitus-, Type 2, Oral Hypoglycemic AgentsMorbid obesity  Renal/GU      Musculoskeletal   Abdominal   Peds  Hematology negative hematology ROS (+)   Anesthesia Other Findings   Reproductive/Obstetrics negative OB ROS                           Anesthesia Physical Anesthesia Plan  ASA: III  Anesthesia Plan: MAC   Post-op Pain Management:    Induction:   Airway Management Planned:   Additional Equipment:   Intra-op Plan:   Post-operative Plan:   Informed Consent: I have reviewed the patients History and Physical, chart, labs and discussed the procedure including the risks, benefits and alternatives for the proposed anesthesia with the patient or authorized representative who has indicated his/her understanding and acceptance.   Dental Advisory Given  Plan Discussed with: CRNA and Surgeon  Anesthesia Plan Comments: (Patient requests local with minimal sedation)         Anesthesia Quick Evaluation

## 2012-01-14 NOTE — Discharge Instructions (Signed)
DISCHARGE INSTRUCTIONS: HYSTEROSCOPY  The following instructions have been prepared to help you care for yourself upon your return home.  NO IBUPROFEN containing medications until after 9:00 pm tonight  Personal hygiene: Marland Kitchen Use sanitary pads for vaginal drainage, not tampons. . Shower the day after your procedure. . NO tub baths, pools or Jacuzzis for 2-3 weeks. . Wipe front to back after using the bathroom.  Activity and limitations: . Do NOT drive or operate any equipment for 24 hours. The effects of anesthesia are still present and drowsiness may result. . Do NOT rest in bed all day. . Walking is encouraged. . Walk up and down stairs slowly. . You may resume your normal activity in one to two days or as indicated by your physician. Sexual activity: NO intercourse for at least 2 weeks after the procedure, or as indicated by your Doctor.  Diet: Eat a light meal as desired this evening. You may resume your usual diet tomorrow.  Return to Work: You may resume your work activities in one to two days or as indicated by Therapist, sports.  What to expect after your surgery: Expect to have vaginal bleeding/discharge for 2-3 days and spotting for up to 10 days. It is not unusual to have soreness for up to 1-2 weeks. You may have a slight burning sensation when you urinate for the first day. Mild cramps may continue for a couple of days. You may have a regular period in 2-6 weeks.  Call your doctor for any of the following: . Excessive vaginal bleeding or clotting, saturating and changing one pad every hour. . Inability to urinate 6 hours after discharge from hospital. . Pain not relieved by pain medication. . Fever of 100.4 F or greater. . Unusual vaginal discharge or odor.  Return to office _________________Call for an appointment ___________________ Patient's signature: ______________________ Nurse's signature ________________________

## 2012-01-14 NOTE — Anesthesia Postprocedure Evaluation (Signed)
Anesthesia Post Note  Patient: Holly Daniels  Procedure(s) Performed: Procedure(s) (LRB): DILATATION AND CURETTAGE /HYSTEROSCOPY (N/A)  Anesthesia type: MAC  Patient location: PACU  Post pain: Pain level controlled  Post assessment: Post-op Vital signs reviewed  Last Vitals:  Filed Vitals:   01/14/12 1545  BP:   Pulse: 86  Temp:   Resp: 23    Post vital signs: Reviewed  Level of consciousness: sedated  Complications: No apparent anesthesia complications

## 2012-01-14 NOTE — Transfer of Care (Signed)
Immediate Anesthesia Transfer of Care Note  Patient: Holly Daniels  Procedure(s) Performed: Procedure(s) (LRB): DILATATION AND CURETTAGE /HYSTEROSCOPY (N/A)  Patient Location: PACU  Anesthesia Type: MAC  Level of Consciousness: awake  Airway & Oxygen Therapy: Patient Spontanous Breathing  Post-op Assessment: Report given to PACU RN and Post -op Vital signs reviewed and stable  Post vital signs: Reviewed and stable  Complications: No apparent anesthesia complications

## 2012-01-15 ENCOUNTER — Encounter (HOSPITAL_COMMUNITY): Payer: Self-pay | Admitting: Obstetrics and Gynecology

## 2012-01-15 ENCOUNTER — Telehealth: Payer: Self-pay | Admitting: Obstetrics and Gynecology

## 2012-01-15 NOTE — Telephone Encounter (Signed)
Spoke to pt after talking to pharmacist. Pt was prescribed Dilaudid 2 mg q 6 hr prn for pain #20 with 0 RF's. Pt is allergic to CDN and pharmacist is refusing to fill. I spoke to Dr. Su Hilt who says the pt does not have a true allergy to CDN, it is just N/V. After verifying this w pt I called the pharmacy to tell them her allergy is not really an allergy, it's an intolerance. Dr. Su Hilt says go ahead and fill it. The pharmacy is out of this med, so I checked with another [facility]) and found that they do have it in stock, but pt will need to go pick up original pharmacy [facility]) and present it to the store who will actually fill it. I notified pt and she is agreeable.  JO, CMA

## 2012-01-15 NOTE — Telephone Encounter (Signed)
Routed to jackie  

## 2012-01-29 ENCOUNTER — Ambulatory Visit (INDEPENDENT_AMBULATORY_CARE_PROVIDER_SITE_OTHER): Payer: 59 | Admitting: Obstetrics and Gynecology

## 2012-01-29 ENCOUNTER — Encounter: Payer: Self-pay | Admitting: Obstetrics and Gynecology

## 2012-01-29 VITALS — BP 122/72 | Temp 98.9°F | Resp 16 | Ht 65.0 in | Wt 192.0 lb

## 2012-01-29 DIAGNOSIS — Z09 Encounter for follow-up examination after completed treatment for conditions other than malignant neoplasm: Secondary | ICD-10-CM

## 2012-01-29 NOTE — Progress Notes (Signed)
DATE OR SURGERY: 01/14/2012 PAIN:No VAG BLEEDING: no VAG DISCHARGE: no NORMAL GI FUNCTN: yes NORMAL GU FUNCTN: yes  No complaints  Filed Vitals:   01/29/12 1626  BP: 122/72  Temp: 98.9 F (37.2 C)  Resp: 16   Pelvic exam: normal external genitalia, vulva, vagina, cervix, uterus and adnexa.  Doing well s/p Hyst/D&C Benign Path RTO for AEX due 11/2012

## 2012-03-01 ENCOUNTER — Other Ambulatory Visit: Payer: Self-pay | Admitting: Cardiology

## 2012-03-03 ENCOUNTER — Other Ambulatory Visit: Payer: Self-pay | Admitting: Cardiology

## 2012-05-30 ENCOUNTER — Other Ambulatory Visit: Payer: Self-pay | Admitting: Cardiology

## 2013-07-01 ENCOUNTER — Other Ambulatory Visit: Payer: Self-pay | Admitting: Cardiology

## 2013-07-01 ENCOUNTER — Ambulatory Visit
Admission: RE | Admit: 2013-07-01 | Discharge: 2013-07-01 | Disposition: A | Discharge status: Home / Self Care | Payer: 59 | Source: Ambulatory Visit | Attending: Cardiology | Admitting: Cardiology

## 2013-07-01 DIAGNOSIS — R52 Pain, unspecified: Secondary | ICD-10-CM

## 2013-07-14 ENCOUNTER — Ambulatory Visit (INDEPENDENT_AMBULATORY_CARE_PROVIDER_SITE_OTHER): Payer: 59 | Admitting: General Surgery

## 2013-07-20 ENCOUNTER — Encounter (INDEPENDENT_AMBULATORY_CARE_PROVIDER_SITE_OTHER): Payer: Self-pay | Admitting: General Surgery

## 2013-07-20 ENCOUNTER — Ambulatory Visit (INDEPENDENT_AMBULATORY_CARE_PROVIDER_SITE_OTHER): Payer: 59 | Admitting: General Surgery

## 2013-07-20 ENCOUNTER — Telehealth (INDEPENDENT_AMBULATORY_CARE_PROVIDER_SITE_OTHER): Payer: Self-pay | Admitting: *Deleted

## 2013-07-20 VITALS — BP 118/78 | HR 84 | Temp 97.0°F | Resp 16 | Ht 63.5 in | Wt 206.6 lb

## 2013-07-20 DIAGNOSIS — K429 Umbilical hernia without obstruction or gangrene: Secondary | ICD-10-CM

## 2013-07-20 NOTE — Telephone Encounter (Signed)
LMOM for pt to return my call.  I was calling to inform pt of her appt at [facility] for CT scan on 07/22/13 with an arrival time of 10:15.  No special instructions needed.

## 2013-07-20 NOTE — Progress Notes (Signed)
Subjective:     Patient ID: Holly Daniels, female   DOB: 1956/02/20, 57 y.o.   MRN: [id number]  HPI The patient is a 57 year old female who is referred by Dr Shana Chute for evaluation of a recurrent umbilical hernia. The patient states that she's had a previous hernia repair at the time of that gallbladder surgery. She is unaware if any mesh was placed versus primary repair. She states that the surgery took place approximately 15-20 years ago.  She also had a previous C-section with a lower midline incision.  Her chief complaint is of periumbilical pain upon direct pressure. She states the pain is on and off. She has not noticed a bulge in the area.  Of note the patient is a current smoker smokes approximately 1 pack every 2 weeks and her last hemoglobin A1c was 12.5.  Review of Systems  Constitutional: Negative.   HENT: Negative.   Respiratory: Negative.   Cardiovascular: Negative.   Gastrointestinal: Negative.   Neurological: Negative.        Objective:   Physical Exam  Constitutional: She is oriented to person, place, and time. She appears well-developed and well-nourished.  HENT:  Head: Normocephalic and atraumatic.  Eyes: Conjunctivae and EOM are normal. Pupils are equal, round, and reactive to light.  Neck: Normal range of motion.  Cardiovascular: Normal rate, regular rhythm and normal heart sounds.   Pulmonary/Chest: Effort normal and breath sounds normal.  Abdominal: Soft. Bowel sounds are normal. There is no tenderness. There is no rebound.    Musculoskeletal: Normal range of motion.  Neurological: She is alert and oriented to person, place, and time.       Assessment:     57 year old female with questionable recurrent umbilical hernia.     Plan:     1. I will order CT scan of her abdomen and pelvis to evaluate for possible recurrent umbilical hernia, as  one cannot be felt on physical exam. 2. Long discussion in regards to her smoking and diabetes and likelihood of  recurrence of the hernia after repair secondary to his comorbidities. She understood and states that she would stop smoking and is working on getting her blood sugars down.  3. After these results reviewed I will call the patient and discuss the results.

## 2013-07-22 ENCOUNTER — Ambulatory Visit
Admission: RE | Admit: 2013-07-22 | Discharge: 2013-07-22 | Disposition: A | Discharge status: Home / Self Care | Payer: 59 | Source: Ambulatory Visit | Attending: General Surgery | Admitting: General Surgery

## 2013-07-22 DIAGNOSIS — K429 Umbilical hernia without obstruction or gangrene: Secondary | ICD-10-CM

## 2013-07-23 ENCOUNTER — Telehealth (INDEPENDENT_AMBULATORY_CARE_PROVIDER_SITE_OTHER): Payer: Self-pay

## 2013-07-23 NOTE — Telephone Encounter (Signed)
Called patient with CT results (No Hernia) made patient aware that the pain she's having could likely be related to her previous operations or just scar tissue.  Patient verbalized understanding.

## 2013-07-24 ENCOUNTER — Encounter (INDEPENDENT_AMBULATORY_CARE_PROVIDER_SITE_OTHER): Payer: Self-pay

## 2013-09-08 ENCOUNTER — Emergency Department (HOSPITAL_BASED_OUTPATIENT_CLINIC_OR_DEPARTMENT_OTHER)
Admission: EM | Admit: 2013-09-08 | Discharge: 2013-09-08 | Disposition: A | Discharge status: Home / Self Care | Payer: 59 | Attending: Emergency Medicine | Admitting: Emergency Medicine

## 2013-09-08 ENCOUNTER — Encounter (HOSPITAL_BASED_OUTPATIENT_CLINIC_OR_DEPARTMENT_OTHER): Payer: Self-pay | Admitting: Emergency Medicine

## 2013-09-08 DIAGNOSIS — E119 Type 2 diabetes mellitus without complications: Secondary | ICD-10-CM | POA: Insufficient documentation

## 2013-09-08 DIAGNOSIS — Z7982 Long term (current) use of aspirin: Secondary | ICD-10-CM | POA: Insufficient documentation

## 2013-09-08 DIAGNOSIS — Z87891 Personal history of nicotine dependence: Secondary | ICD-10-CM | POA: Insufficient documentation

## 2013-09-08 DIAGNOSIS — R42 Dizziness and giddiness: Secondary | ICD-10-CM

## 2013-09-08 DIAGNOSIS — Z79899 Other long term (current) drug therapy: Secondary | ICD-10-CM | POA: Insufficient documentation

## 2013-09-08 DIAGNOSIS — Z9889 Other specified postprocedural states: Secondary | ICD-10-CM | POA: Insufficient documentation

## 2013-09-08 DIAGNOSIS — IMO0001 Reserved for inherently not codable concepts without codable children: Secondary | ICD-10-CM | POA: Insufficient documentation

## 2013-09-08 DIAGNOSIS — F411 Generalized anxiety disorder: Secondary | ICD-10-CM | POA: Insufficient documentation

## 2013-09-08 DIAGNOSIS — R110 Nausea: Secondary | ICD-10-CM | POA: Insufficient documentation

## 2013-09-08 DIAGNOSIS — K219 Gastro-esophageal reflux disease without esophagitis: Secondary | ICD-10-CM | POA: Insufficient documentation

## 2013-09-08 DIAGNOSIS — I10 Essential (primary) hypertension: Secondary | ICD-10-CM | POA: Insufficient documentation

## 2013-09-08 MED ORDER — MECLIZINE HCL 25 MG PO TABS: 25.0000 mg | ORAL_TABLET | Freq: Three times a day (TID) | ORAL | Status: DC | PRN

## 2013-09-08 MED ORDER — DIAZEPAM 5 MG PO TABS: 5.0000 mg | ORAL_TABLET | Freq: Three times a day (TID) | ORAL | Status: DC | PRN

## 2013-09-08 MED ORDER — ONDANSETRON 4 MG PO TBDP
4.0000 mg | ORAL_TABLET | Freq: Once | ORAL | Status: AC
  Administered 2013-09-08: 4 mg via ORAL
  Filled 2013-09-08: qty 1

## 2013-09-08 MED ORDER — MECLIZINE HCL 25 MG PO TABS
25.0000 mg | ORAL_TABLET | Freq: Once | ORAL | Status: AC
  Administered 2013-09-08: 25 mg via ORAL
  Filled 2013-09-08: qty 1

## 2013-09-08 MED ORDER — DIAZEPAM 5 MG PO TABS
5.0000 mg | ORAL_TABLET | Freq: Once | ORAL | Status: AC
  Administered 2013-09-08: 5 mg via ORAL
  Filled 2013-09-08: qty 1

## 2013-09-08 NOTE — ED Notes (Signed)
MD at bedside. 

## 2013-09-08 NOTE — ED Notes (Signed)
Pt reports onset of dizziness last night that is worse today.  Recent sinus symptoms. Dizziness worsens with positional changes

## 2013-09-08 NOTE — ED Notes (Signed)
Dizziness since last night. Sinus problems x 2-3 weeks. She has been using nasal spray. Nausea.

## 2013-09-08 NOTE — ED Provider Notes (Signed)
CSN: [id number]     Arrival date & time 09/08/13  1343 History   First MD Initiated Contact with Patient 09/08/13 1350     Chief Complaint  Patient presents with  . Dizziness   (Consider location/radiation/quality/duration/timing/severity/associated sxs/prior Treatment) HPI Comments: Dizziness described as spinning sensation since last night.  Patient is a 57 y.o. female presenting with neurologic complaint. The history is provided by the patient.  Neurologic Problem This is a new problem. The current episode started 12 to 24 hours ago. The problem occurs constantly. The problem has not changed since onset.Pertinent negatives include no chest pain, no abdominal pain and no shortness of breath. Exacerbated by: changing positions. Nothing relieves the symptoms. She has tried nothing for the symptoms.    Past Medical History  Diagnosis Date  . History of C-section     x2  . Diabetes mellitus     oral meds  . Hypertension   . Anxiety   . GERD (gastroesophageal reflux disease)   . Headache(784.0)     sinus  . Fibromyalgia    Past Surgical History  Procedure Laterality Date  . Cesarean section      x2  . Cholecystectomy    . Vulvar cyst    . Hysteroscopy w/d&c  01/14/2012    Procedure: DILATATION AND CURETTAGE /HYSTEROSCOPY;  Surgeon: Purcell Nails, MD;  Location: [facility];  Service: Gynecology;  Laterality: N/A;   Family History  Problem Relation Age of Onset  . Cancer Father     colon cancer  . Colon cancer Father   . Cancer Sister     brain and breast cancer  . Breast cancer Sister   . Cancer Sister     breast cancer  . Breast cancer Sister    History  Substance Use Topics  . Smoking status: Former Smoker -- 1.00 packs/day for 15 years  . Smokeless tobacco: Not on file  . Alcohol Use: No   OB History   Grav Para Term Preterm Abortions TAB SAB Ect Mult Living   2 0 0    0   2     Review of Systems  Constitutional: Negative for fever.  Respiratory: Negative  for shortness of breath.   Cardiovascular: Negative for chest pain.  Gastrointestinal: Positive for nausea. Negative for vomiting and abdominal pain.  All other systems reviewed and are negative.    Allergies  Codeine and Lactose intolerance (gi)  Home Medications   Current Outpatient Rx  Name  Route  Sig  Dispense  Refill  . ALPRAZolam (XANAX) 1 MG tablet   Oral   Take 1 mg by mouth at bedtime as needed. For anxiety         . amLODipine (NORVASC) 5 MG tablet   Oral   Take 5 mg by mouth every morning.         Marland Kitchen aspirin 81 MG chewable tablet   Oral   Chew 81 mg by mouth every morning.         . Cholecalciferol (VITAMIN D3) 3000 UNITS TABS   Oral   Take 1 tablet by mouth every morning.         . ezetimibe-simvastatin (VYTORIN) 10-40 MG per tablet   Oral   Take 1 tablet by mouth at bedtime.         Marland Kitchen FARXIGA 10 MG TABS               . glimepiride (AMARYL) 4 MG tablet  Oral   Take 4 mg by mouth daily before breakfast.         . hydrochlorothiazide (HYDRODIURIL) 25 MG tablet               . ibuprofen (ADVIL,MOTRIN) 200 MG tablet   Oral   Take 800 mg by mouth every 6 (six) hours as needed. For headache         . lisinopril (PRINIVIL,ZESTRIL) 40 MG tablet               . lisinopril-hydrochlorothiazide (PRINZIDE,ZESTORETIC) 20-12.5 MG per tablet   Oral   Take 1 tablet by mouth every morning.         . metFORMIN (GLUCOPHAGE) 1000 MG tablet   Oral   Take 1,000 mg by mouth 2 (two) times daily with a meal.         . nitroGLYCERIN (NITROSTAT) 0.4 MG SL tablet   Sublingual   Place 0.4 mg under the tongue every 5 (five) minutes as needed. For chest pains, never needed but has for emergency purpose         . omeprazole (PRILOSEC) 20 MG capsule   Oral   Take 40 mg by mouth every morning.         Marland Kitchen oxymetazoline (AFRIN) 0.05 % nasal spray   Nasal   Place 2 sprays into the nose 2 (two) times daily as needed. For congestion          . pioglitazone (ACTOS) 30 MG tablet                BP 137/69  Pulse 66  Temp(Src) 98.5 F (36.9 C) (Oral)  Resp 16  Ht 5\' 3"  (1.6 m)  Wt 216 lb (97.977 kg)  BMI 38.27 kg/m2  SpO2 97% Physical Exam  Nursing note and vitals reviewed. Constitutional: She is oriented to person, place, and time. She appears well-developed and well-nourished. No distress.  HENT:  Head: Normocephalic and atraumatic.  Eyes: EOM are normal. Pupils are equal, round, and reactive to light.  EOM testing evokes dizziness  Neck: Normal range of motion. Neck supple.  Cardiovascular: Normal rate and regular rhythm.  Exam reveals no friction rub.   No murmur heard. Pulmonary/Chest: Effort normal and breath sounds normal. No respiratory distress. She has no wheezes. She has no rales.  Abdominal: Soft. She exhibits no distension. There is no tenderness. There is no rebound.  Musculoskeletal: Normal range of motion. She exhibits no edema.  Neurological: She is alert and oriented to person, place, and time. No cranial nerve deficit. She exhibits normal muscle tone. Coordination normal.  Skin: No rash noted. She is not diaphoretic.    ED Course  Procedures (including critical care time) Labs Review Labs Reviewed - No data to display Imaging Review No results found.  EKG Interpretation   None       MDM   1. Vertigo    3F presents with dizziness. Began last night, described as spinning. Intermittent, worse with movements. Associated nausea. Associated URI symptoms of nasal congestion, stuffy ears, sore throat recently. On exam, EOM testing evokes dizziness. CN intact otherwise with some mild nystagmus on EOMs. Strength and coordination intact. Clinical picture c/w dizziness secondary to congestion from URI. Meclizine, zofran, valium given.  Improving after meds. Stable for discharge.  Dagmar Hait, MD 09/08/13 [id number]

## 2014-08-02 ENCOUNTER — Encounter (HOSPITAL_BASED_OUTPATIENT_CLINIC_OR_DEPARTMENT_OTHER): Payer: Self-pay | Admitting: Emergency Medicine

## 2015-07-30 ENCOUNTER — Emergency Department (HOSPITAL_BASED_OUTPATIENT_CLINIC_OR_DEPARTMENT_OTHER)
Admission: EM | Admit: 2015-07-30 | Discharge: 2015-07-30 | Disposition: A | Discharge status: Home / Self Care | Payer: Self-pay | Attending: Emergency Medicine | Admitting: Emergency Medicine

## 2015-07-30 ENCOUNTER — Encounter (HOSPITAL_BASED_OUTPATIENT_CLINIC_OR_DEPARTMENT_OTHER): Payer: Self-pay

## 2015-07-30 ENCOUNTER — Emergency Department (HOSPITAL_BASED_OUTPATIENT_CLINIC_OR_DEPARTMENT_OTHER): Payer: Self-pay

## 2015-07-30 DIAGNOSIS — Z98891 History of uterine scar from previous surgery: Secondary | ICD-10-CM | POA: Insufficient documentation

## 2015-07-30 DIAGNOSIS — S79912A Unspecified injury of left hip, initial encounter: Secondary | ICD-10-CM | POA: Insufficient documentation

## 2015-07-30 DIAGNOSIS — Y9389 Activity, other specified: Secondary | ICD-10-CM | POA: Insufficient documentation

## 2015-07-30 DIAGNOSIS — S3992XA Unspecified injury of lower back, initial encounter: Secondary | ICD-10-CM | POA: Insufficient documentation

## 2015-07-30 DIAGNOSIS — Z7982 Long term (current) use of aspirin: Secondary | ICD-10-CM | POA: Insufficient documentation

## 2015-07-30 DIAGNOSIS — I10 Essential (primary) hypertension: Secondary | ICD-10-CM | POA: Insufficient documentation

## 2015-07-30 DIAGNOSIS — E119 Type 2 diabetes mellitus without complications: Secondary | ICD-10-CM | POA: Insufficient documentation

## 2015-07-30 DIAGNOSIS — M797 Fibromyalgia: Secondary | ICD-10-CM | POA: Insufficient documentation

## 2015-07-30 DIAGNOSIS — W1840XA Slipping, tripping and stumbling without falling, unspecified, initial encounter: Secondary | ICD-10-CM | POA: Insufficient documentation

## 2015-07-30 DIAGNOSIS — S86912A Strain of unspecified muscle(s) and tendon(s) at lower leg level, left leg, initial encounter: Secondary | ICD-10-CM | POA: Insufficient documentation

## 2015-07-30 DIAGNOSIS — Y92481 Parking lot as the place of occurrence of the external cause: Secondary | ICD-10-CM | POA: Insufficient documentation

## 2015-07-30 DIAGNOSIS — Z87891 Personal history of nicotine dependence: Secondary | ICD-10-CM | POA: Insufficient documentation

## 2015-07-30 DIAGNOSIS — Y998 Other external cause status: Secondary | ICD-10-CM | POA: Insufficient documentation

## 2015-07-30 DIAGNOSIS — K219 Gastro-esophageal reflux disease without esophagitis: Secondary | ICD-10-CM | POA: Insufficient documentation

## 2015-07-30 DIAGNOSIS — S99912A Unspecified injury of left ankle, initial encounter: Secondary | ICD-10-CM | POA: Insufficient documentation

## 2015-07-30 DIAGNOSIS — F419 Anxiety disorder, unspecified: Secondary | ICD-10-CM | POA: Insufficient documentation

## 2015-07-30 DIAGNOSIS — Z79899 Other long term (current) drug therapy: Secondary | ICD-10-CM | POA: Insufficient documentation

## 2015-07-30 DIAGNOSIS — M25572 Pain in left ankle and joints of left foot: Secondary | ICD-10-CM

## 2015-07-30 NOTE — ED Provider Notes (Signed)
CSN: [id number]     Arrival date & time 07/30/15  1818 History  By signing my name below, I, Helane Gunther, attest that this documentation has been prepared under the direction and in the presence of Pattricia Boss, MD. Electronically Signed: Helane Gunther, ED Scribe. 07/30/2015. 7:29 PM.    Chief Complaint  Patient presents with  . Ankle Injury   The history is provided by the patient. No language interpreter was used.   HPI Comments: Holly Daniels is a 59 y.o. female with a PMHx of HTN, Dm, and fibromyalgia who presents to the Emergency Department complaining of an injury to the left ankle sustained 2 hours ago. Pt states she twisted the left ankle and left knee when she got out of the car and stepped into a dip in the pavement at a [employer] parking lot. She notes she did not fall, as she was able to grab a hold of the car to prevent herself from falling. She reports associated left ankle pain, left knee pain, and left hip pain. She denies taking anything for pain.    Past Medical History  Diagnosis Date  . History of C-section     x2  . Diabetes mellitus     oral meds  . Hypertension   . Anxiety   . GERD (gastroesophageal reflux disease)   . Headache(784.0)     sinus  . Fibromyalgia    Past Surgical History  Procedure Laterality Date  . Cesarean section      x2  . Cholecystectomy    . Vulvar cyst    . Hysteroscopy w/d&c  01/14/2012    Procedure: DILATATION AND CURETTAGE /HYSTEROSCOPY;  Surgeon: Delice Lesch, MD;  Location: [facility];  Service: Gynecology;  Laterality: N/A;   Family History  Problem Relation Age of Onset  . Cancer Father     colon cancer  . Colon cancer Father   . Cancer Sister     brain and breast cancer  . Breast cancer Sister   . Cancer Sister     breast cancer  . Breast cancer Sister    Social History  Substance Use Topics  . Smoking status: Former Smoker -- 1.00 packs/day for 15 years  . Smokeless tobacco: None  . Alcohol Use: No   OB History     Gravida Para Term Preterm AB TAB SAB Ectopic Multiple Living   2 0 0    0   2     Review of Systems  Musculoskeletal: Positive for back pain, joint swelling and arthralgias.  All other systems reviewed and are negative.   Allergies  Codeine and Lactose intolerance (gi)  Home Medications   Prior to Admission medications   Medication Sig Start Date End Date Taking? Authorizing Provider  ALPRAZolam Duanne Moron) 1 MG tablet Take 1 mg by mouth at bedtime as needed. For anxiety   Yes Historical Provider, MD  aspirin 81 MG chewable tablet Chew 81 mg by mouth every morning.   Yes Historical Provider, MD  Cholecalciferol (VITAMIN D3) 3000 UNITS TABS Take 1 tablet by mouth every morning.   Yes Historical Provider, MD  ezetimibe-simvastatin (VYTORIN) 10-40 MG per tablet Take 1 tablet by mouth at bedtime.   Yes Historical Provider, MD  hydrochlorothiazide (HYDRODIURIL) 25 MG tablet  05/11/13  Yes Historical Provider, MD  ibuprofen (ADVIL,MOTRIN) 200 MG tablet Take 800 mg by mouth every 6 (six) hours as needed. For headache   Yes Historical Provider, MD  lisinopril (PRINIVIL,ZESTRIL) 40 MG  tablet  05/11/13  Yes Historical Provider, MD  meclizine (ANTIVERT) 25 MG tablet Take 1 tablet (25 mg total) by mouth 3 (three) times daily as needed for dizziness. 09/08/13  Yes Evelina Bucy, MD  nitroGLYCERIN (NITROSTAT) 0.4 MG SL tablet Place 0.4 mg under the tongue every 5 (five) minutes as needed. For chest pains, never needed but has for emergency purpose   Yes Historical Provider, MD  omeprazole (PRILOSEC) 20 MG capsule Take 40 mg by mouth every morning.   Yes Historical Provider, MD  amLODipine (NORVASC) 5 MG tablet Take 5 mg by mouth every morning.    Historical Provider, MD  diazepam (VALIUM) 5 MG tablet Take 1 tablet (5 mg total) by mouth every 8 (eight) hours as needed for anxiety. 09/08/13   Evelina Bucy, MD  FARXIGA 10 MG TABS  07/02/13   Historical Provider, MD  glimepiride (AMARYL) 4 MG tablet Take 4 mg  by mouth daily before breakfast.    Historical Provider, MD  lisinopril-hydrochlorothiazide (PRINZIDE,ZESTORETIC) 20-12.5 MG per tablet Take 1 tablet by mouth every morning.    Historical Provider, MD  metFORMIN (GLUCOPHAGE) 1000 MG tablet Take 1,000 mg by mouth 2 (two) times daily with a meal.    Historical Provider, MD  oxymetazoline (AFRIN) 0.05 % nasal spray Place 2 sprays into the nose 2 (two) times daily as needed. For congestion    Historical Provider, MD  pioglitazone (ACTOS) 30 MG tablet  05/13/13   Historical Provider, MD   BP 128/74 mmHg  Pulse 74  Temp(Src) 98.3 F (36.8 C) (Oral)  Resp 20  Ht 5' 3.5" (1.613 m)  Wt 201 lb (91.173 kg)  BMI 35.04 kg/m2  SpO2 97% Physical Exam  Constitutional: She is oriented to person, place, and time. She appears well-developed and well-nourished.  HENT:  Head: Normocephalic and atraumatic.  Right Ear: External ear normal.  Left Ear: External ear normal.  Nose: Nose normal.  Mouth/Throat: Oropharynx is clear and moist.  Eyes: Conjunctivae and EOM are normal. Pupils are equal, round, and reactive to light.  Neck: Normal range of motion. Neck supple.  Cardiovascular: Normal rate, regular rhythm, normal heart sounds and intact distal pulses.   Pulmonary/Chest: Effort normal and breath sounds normal.  Abdominal: Soft. Bowel sounds are normal.  Musculoskeletal: Normal range of motion.       Left knee: She exhibits effusion and MCL laxity. She exhibits normal range of motion, no swelling, no ecchymosis, no deformity, no LCL laxity and normal patellar mobility. Tenderness found. Lateral joint line tenderness noted.       Legs: Full arom left knee and ankle without ligamentous laxity noted.  ? Small effusion of left knee, no external signs of trauma noted.  Neurological: She is alert and oriented to person, place, and time. She has normal reflexes.  Skin: Skin is warm and dry.  Psychiatric: She has a normal mood and affect. Her behavior is normal.  Judgment and thought content normal.  Nursing note and vitals reviewed.   ED Course  Procedures  DIAGNOSTIC STUDIES: Oxygen Saturation is 97% on RA, normal by my interpretation.    COORDINATION OF CARE: 7:18 PM - Discussed normal XR results. Discussed plans to order a pair of crutches. Advised to take tylenol or ibuprofen for pain, and to f/u with PCP next week. Pt advised of plan for treatment and pt agrees.  Labs Review Labs Reviewed - No data to display  Imaging Review Dg Ankle Complete Left  07/30/2015  CLINICAL DATA:  Left ankle injury, left ankle and left foot pain. EXAM: LEFT ANKLE COMPLETE - 3+ VIEW COMPARISON:  None. FINDINGS: Osseous alignment is normal. Ankle mortise is grossly symmetric. No fracture line or displaced fracture fragment seen. No acute - appearing cortical irregularity or osseous lesion. Perhaps mild soft tissue swelling but not convincing. Incidental note made of mild enthesopathy along the plantar margin of the calcaneus. IMPRESSION: No osseous fracture or dislocation. Probable mild soft tissue swelling. Electronically Signed   By: Franki Cabot M.D.   On: 07/30/2015 18:59   Dg Knee Complete 4 Views Left  07/30/2015  CLINICAL DATA:  Left knee injury, left knee pain. EXAM: LEFT KNEE - COMPLETE 4+ VIEW COMPARISON:  None. FINDINGS: There is mild degenerative narrowing of the medial compartment, with associated mild osseous spurring. Lateral and patellofemoral compartments appear relatively well preserved. Osseous alignment otherwise normal. No fracture line or displaced fracture fragment identified. No convincing joint effusion, possible small effusion within the suprapatellar bursa. Superficial soft tissues are unremarkable. IMPRESSION: 1. Mild degenerative narrowing of the medial knee compartment, suggesting underlying cartilage loss and/or chronic internal derangement. 2. Questionable joint effusion within the suprapatellar bursa, but not convincing. 3. No osseous  fracture or dislocation. Electronically Signed   By: Franki Cabot M.D.   On: 07/30/2015 19:01   Dg Foot Complete Left  07/30/2015  CLINICAL DATA:  59 year old female with a history of ankle injury EXAM: LEFT FOOT - COMPLETE 3+ VIEW COMPARISON:  None. FINDINGS: There is no evidence of fracture or dislocation. No significant soft tissue swelling. No radiopaque foreign body. Degenerative changes of the midfoot and hindfoot. IMPRESSION: Negative for acute bony abnormality. Signed, Dulcy Fanny. Earleen Newport, DO [facility] Electronically Signed   By: Corrie Mckusick D.O.   On: 07/30/2015 18:59   I have personally reviewed and evaluated these images and lab results as part of my medical decision-making.   EKG Interpretation None      MDM   Final diagnoses:  Ankle pain, left  Knee strain, left, initial encounter    I personally performed the services described in this documentation, which was scribed in my presence. The recorded information has been reviewed and considered.   Pattricia Boss, MD 08/04/15 [phone]

## 2015-07-30 NOTE — ED Notes (Signed)
Pt reports she was at a local [employer] when she stepped down wrong and twisted her left ankle and left knee. Complaining of left lower back pain, left hip pain, left knee pain, left ankle and left foot pain. Pt reports there was a dip in the pavement and caused her to step down wrong and twist her LLE.

## 2015-07-30 NOTE — Discharge Instructions (Signed)
Please use crutches as needed.  Recheck with your doctor this week.

## 2015-08-29 ENCOUNTER — Other Ambulatory Visit: Payer: Self-pay | Admitting: Family Medicine

## 2015-08-29 DIAGNOSIS — M25562 Pain in left knee: Secondary | ICD-10-CM

## 2015-09-04 ENCOUNTER — Ambulatory Visit
Admission: RE | Admit: 2015-09-04 | Discharge: 2015-09-04 | Disposition: A | Discharge status: Home / Self Care | Payer: 59 | Source: Ambulatory Visit | Attending: Family Medicine | Admitting: Family Medicine

## 2015-09-04 DIAGNOSIS — M25562 Pain in left knee: Secondary | ICD-10-CM

## 2015-09-19 ENCOUNTER — Encounter (HOSPITAL_BASED_OUTPATIENT_CLINIC_OR_DEPARTMENT_OTHER): Payer: Self-pay | Admitting: *Deleted

## 2015-09-19 ENCOUNTER — Other Ambulatory Visit: Payer: Self-pay | Admitting: Orthopedic Surgery

## 2015-09-27 ENCOUNTER — Other Ambulatory Visit: Payer: Self-pay

## 2015-09-27 ENCOUNTER — Encounter (HOSPITAL_BASED_OUTPATIENT_CLINIC_OR_DEPARTMENT_OTHER)
Admission: RE | Admit: 2015-09-27 | Discharge: 2015-09-27 | Disposition: A | Discharge status: Home / Self Care | Payer: 59 | Source: Ambulatory Visit | Attending: Orthopedic Surgery | Admitting: Orthopedic Surgery

## 2015-09-27 DIAGNOSIS — E739 Lactose intolerance, unspecified: Secondary | ICD-10-CM | POA: Diagnosis not present

## 2015-09-27 DIAGNOSIS — K219 Gastro-esophageal reflux disease without esophagitis: Secondary | ICD-10-CM | POA: Diagnosis not present

## 2015-09-27 DIAGNOSIS — Y929 Unspecified place or not applicable: Secondary | ICD-10-CM | POA: Diagnosis not present

## 2015-09-27 DIAGNOSIS — Z79899 Other long term (current) drug therapy: Secondary | ICD-10-CM | POA: Diagnosis not present

## 2015-09-27 DIAGNOSIS — M797 Fibromyalgia: Secondary | ICD-10-CM | POA: Diagnosis not present

## 2015-09-27 DIAGNOSIS — G4733 Obstructive sleep apnea (adult) (pediatric): Secondary | ICD-10-CM | POA: Diagnosis not present

## 2015-09-27 DIAGNOSIS — Z794 Long term (current) use of insulin: Secondary | ICD-10-CM | POA: Diagnosis not present

## 2015-09-27 DIAGNOSIS — F419 Anxiety disorder, unspecified: Secondary | ICD-10-CM | POA: Diagnosis not present

## 2015-09-27 DIAGNOSIS — S83232A Complex tear of medial meniscus, current injury, left knee, initial encounter: Secondary | ICD-10-CM | POA: Diagnosis present

## 2015-09-27 DIAGNOSIS — I10 Essential (primary) hypertension: Secondary | ICD-10-CM | POA: Diagnosis not present

## 2015-09-27 DIAGNOSIS — Z7982 Long term (current) use of aspirin: Secondary | ICD-10-CM | POA: Diagnosis not present

## 2015-09-27 DIAGNOSIS — M94262 Chondromalacia, left knee: Secondary | ICD-10-CM | POA: Diagnosis not present

## 2015-09-27 DIAGNOSIS — X501XXA Overexertion from prolonged static or awkward postures, initial encounter: Secondary | ICD-10-CM | POA: Diagnosis not present

## 2015-09-27 DIAGNOSIS — E119 Type 2 diabetes mellitus without complications: Secondary | ICD-10-CM | POA: Diagnosis not present

## 2015-09-27 DIAGNOSIS — Z87891 Personal history of nicotine dependence: Secondary | ICD-10-CM | POA: Diagnosis not present

## 2015-09-27 LAB — BASIC METABOLIC PANEL
ANION GAP: 8 (ref 5–15)
BUN: 22 mg/dL — ABNORMAL HIGH (ref 6–20)
CALCIUM: 9.2 mg/dL (ref 8.9–10.3)
CHLORIDE: 104 mmol/L (ref 101–111)
CO2: 29 mmol/L (ref 22–32)
Creatinine, Ser: 1.19 mg/dL — ABNORMAL HIGH (ref 0.44–1.00)
GFR calc non Af Amer: 49 mL/min — ABNORMAL LOW (ref >60)
GFR, EST AFRICAN AMERICAN: 56 mL/min — AB (ref >60)
Glucose, Bld: 222 mg/dL — ABNORMAL HIGH (ref 65–99)
POTASSIUM: 4.2 mmol/L (ref 3.5–5.1)
Sodium: 141 mmol/L (ref 135–145)

## 2015-09-28 NOTE — H&P (Signed)
Holly Daniels is an 59 y.o. female.    Chief Complaint:  Left Knee Pain  HPI: Holly Daniels is seen in consultation from Dr. Rip Harbour for a complex large medial meniscal tear that she sustained stepping out of a car 6 weeks ago.  She also hurt her ankle and is in a [facility] being treated conservatively by Dr. Rip Harbour.  An MRI scan has been accomplished showing a large complex tear. The pain is constant, wakes her up at night and interferes with activities.  She has to use a cane for ambulation.  She is a type II diabetic and reports that she thinks her most recent A1c was 14 a few months ago, but she was started on insulin at that time and it is probably better.  She thinks her sugars are normally running in the 180 range.  She also is treated for high blood pressure and irritable bowel syndrome.  Past Medical History  Diagnosis Date  . History of C-section     x2  . Diabetes mellitus     oral meds  . Hypertension   . Anxiety   . GERD (gastroesophageal reflux disease)   . Headache(784.0)     sinus  . Fibromyalgia   . OSA (obstructive sleep apnea)     Past Surgical History  Procedure Laterality Date  . Cesarean section      x2  . Cholecystectomy    . Vulvar cyst    . Hysteroscopy w/d&c  01/14/2012    Procedure: DILATATION AND CURETTAGE /HYSTEROSCOPY;  Surgeon: Delice Lesch, MD;  Location: [facility];  Service: Gynecology;  Laterality: N/A;    Family History  Problem Relation Age of Onset  . Cancer Father     colon cancer  . Colon cancer Father   . Cancer Sister     brain and breast cancer  . Breast cancer Sister   . Cancer Sister     breast cancer  . Breast cancer Sister    Social History:  reports that she has quit smoking. She does not have any smokeless tobacco history on file. She reports that she does not drink alcohol or use illicit drugs.  Allergies:  Allergies  Allergen Reactions  . Codeine Nausea And Vomiting  . Lactose Intolerance (Gi) Other (See Comments)     Cause GI upset    No prescriptions prior to admission    Results for orders placed or performed during the hospital encounter of 09/29/15 (from the past 48 hour(s))  Basic metabolic panel     Status: Abnormal   Collection Time: 09/27/15  2:45 PM  Result Value Ref Range   Sodium 141 135 - 145 mmol/L   Potassium 4.2 3.5 - 5.1 mmol/L   Chloride 104 101 - 111 mmol/L   CO2 29 22 - 32 mmol/L   Glucose, Bld 222 (H) 65 - 99 mg/dL   BUN 22 (H) 6 - 20 mg/dL   Creatinine, Ser 1.19 (H) 0.44 - 1.00 mg/dL   Calcium 9.2 8.9 - 10.3 mg/dL   GFR calc non Af Amer 49 (L) >60 mL/min   GFR calc Af Amer 56 (L) >60 mL/min    Comment: (NOTE) The eGFR has been calculated using the CKD EPI equation. This calculation has not been validated in all clinical situations. eGFR's persistently <60 mL/min signify possible Chronic Kidney Disease.    Anion gap 8 5 - 15   No results found.  Review of Systems  Constitutional: Negative.  HENT: Negative.   Eyes: Negative.   Respiratory: Negative.   Cardiovascular: Negative.   Gastrointestinal: Negative.   Genitourinary: Negative.   Musculoskeletal: Positive for myalgias and joint pain.  Skin: Negative.   Neurological: Negative.   Endo/Heme/Allergies:       Uncontrolled diabetes  Psychiatric/Behavioral: The patient is nervous/anxious.     Height 5' 3.5" (1.613 m), weight 91.173 kg (201 lb). Physical Exam  Constitutional: She is oriented to person, place, and time. She appears well-developed and well-nourished.  HENT:  Head: Normocephalic and atraumatic.  Eyes: Pupils are equal, round, and reactive to light.  Neck: Normal range of motion. Neck supple.  Cardiovascular: Intact distal pulses.   Respiratory: Effort normal.  Musculoskeletal: She exhibits tenderness.  Tender along the medial joint line of the left knee.  One plus effusion, collateral ligaments are stable she has significant pain if you attempt to flex her past 120 or go to full extension.     Neurological: She is alert and oriented to person, place, and time.  Skin: Skin is warm and dry.  Psychiatric: She has a normal mood and affect. Her behavior is normal. Judgment and thought content normal.     Assessment/Plan Assess: A [age]-year-old woman with a complex large symptomatic medial meniscal tear of the left knee, type II diabetic who thinks her A1c most recently was 14.  Plan: We will repeat her A1c. She was given a prescription for that today.  We'll try to get her set up for surgical intervention if her A1c has come down. We can probably get it done at the one of the day surgery centers.  If not, we may have to do it at the main hospital for safety issues.  The risks and benefits of surgery discussed at length and I will see her back at the time of surgical intervention.  PHILLIPS, ERIC R 09/28/2015, 8:32 AM

## 2015-09-29 ENCOUNTER — Encounter (HOSPITAL_BASED_OUTPATIENT_CLINIC_OR_DEPARTMENT_OTHER)
Admission: RE | Disposition: A | Discharge status: Home / Self Care | Payer: Self-pay | Source: Ambulatory Visit | Attending: Orthopedic Surgery

## 2015-09-29 ENCOUNTER — Ambulatory Visit (HOSPITAL_BASED_OUTPATIENT_CLINIC_OR_DEPARTMENT_OTHER): Payer: 59 | Admitting: Anesthesiology

## 2015-09-29 ENCOUNTER — Encounter (HOSPITAL_BASED_OUTPATIENT_CLINIC_OR_DEPARTMENT_OTHER): Payer: Self-pay | Admitting: Anesthesiology

## 2015-09-29 ENCOUNTER — Ambulatory Visit (HOSPITAL_BASED_OUTPATIENT_CLINIC_OR_DEPARTMENT_OTHER)
Admission: RE | Admit: 2015-09-29 | Discharge: 2015-09-29 | Disposition: A | Discharge status: Home / Self Care | Payer: 59 | Source: Ambulatory Visit | Attending: Orthopedic Surgery | Admitting: Orthopedic Surgery

## 2015-09-29 DIAGNOSIS — E119 Type 2 diabetes mellitus without complications: Secondary | ICD-10-CM | POA: Insufficient documentation

## 2015-09-29 DIAGNOSIS — Z794 Long term (current) use of insulin: Secondary | ICD-10-CM | POA: Insufficient documentation

## 2015-09-29 DIAGNOSIS — X501XXA Overexertion from prolonged static or awkward postures, initial encounter: Secondary | ICD-10-CM | POA: Insufficient documentation

## 2015-09-29 DIAGNOSIS — Y929 Unspecified place or not applicable: Secondary | ICD-10-CM | POA: Insufficient documentation

## 2015-09-29 DIAGNOSIS — M94262 Chondromalacia, left knee: Secondary | ICD-10-CM | POA: Insufficient documentation

## 2015-09-29 DIAGNOSIS — Z79899 Other long term (current) drug therapy: Secondary | ICD-10-CM | POA: Insufficient documentation

## 2015-09-29 DIAGNOSIS — M797 Fibromyalgia: Secondary | ICD-10-CM | POA: Insufficient documentation

## 2015-09-29 DIAGNOSIS — I10 Essential (primary) hypertension: Secondary | ICD-10-CM | POA: Insufficient documentation

## 2015-09-29 DIAGNOSIS — S83242A Other tear of medial meniscus, current injury, left knee, initial encounter: Secondary | ICD-10-CM

## 2015-09-29 DIAGNOSIS — K219 Gastro-esophageal reflux disease without esophagitis: Secondary | ICD-10-CM | POA: Insufficient documentation

## 2015-09-29 DIAGNOSIS — Z87891 Personal history of nicotine dependence: Secondary | ICD-10-CM | POA: Insufficient documentation

## 2015-09-29 DIAGNOSIS — S83232A Complex tear of medial meniscus, current injury, left knee, initial encounter: Secondary | ICD-10-CM | POA: Insufficient documentation

## 2015-09-29 DIAGNOSIS — G4733 Obstructive sleep apnea (adult) (pediatric): Secondary | ICD-10-CM | POA: Insufficient documentation

## 2015-09-29 DIAGNOSIS — E739 Lactose intolerance, unspecified: Secondary | ICD-10-CM | POA: Insufficient documentation

## 2015-09-29 DIAGNOSIS — Z7982 Long term (current) use of aspirin: Secondary | ICD-10-CM | POA: Insufficient documentation

## 2015-09-29 DIAGNOSIS — F419 Anxiety disorder, unspecified: Secondary | ICD-10-CM | POA: Insufficient documentation

## 2015-09-29 HISTORY — PX: KNEE ARTHROSCOPY WITH MEDIAL MENISECTOMY: SHX5651

## 2015-09-29 HISTORY — DX: Obstructive sleep apnea (adult) (pediatric): G47.33

## 2015-09-29 HISTORY — PX: CHONDROPLASTY: SHX5177

## 2015-09-29 LAB — GLUCOSE, CAPILLARY: Glucose-Capillary: 160 mg/dL — ABNORMAL HIGH (ref 65–99)

## 2015-09-29 SURGERY — ARTHROSCOPY, KNEE, WITH MEDIAL MENISCECTOMY
Anesthesia: General | Site: Knee | Laterality: Left

## 2015-09-29 MED ORDER — PROPOFOL 10 MG/ML IV BOLUS
INTRAVENOUS | Status: DC | PRN
  Administered 2015-09-29: 200 mg via INTRAVENOUS

## 2015-09-29 MED ORDER — PROPOFOL 500 MG/50ML IV EMUL
INTRAVENOUS | Status: AC
  Filled 2015-09-29: qty 50

## 2015-09-29 MED ORDER — BUPIVACAINE-EPINEPHRINE 0.5% -1:200000 IJ SOLN
INTRAMUSCULAR | Status: DC | PRN
  Administered 2015-09-29: 20 mL
  Administered 2015-09-29: 10 mL

## 2015-09-29 MED ORDER — HYDROMORPHONE HCL 2 MG PO TABS
ORAL_TABLET | ORAL | Status: AC
  Filled 2015-09-29: qty 1

## 2015-09-29 MED ORDER — EPINEPHRINE HCL 1 MG/ML IJ SOLN
INTRAMUSCULAR | Status: AC
  Filled 2015-09-29: qty 1

## 2015-09-29 MED ORDER — OXYCODONE HCL 5 MG PO TABS: 5.0000 mg | ORAL_TABLET | Freq: Once | ORAL | Status: DC | PRN

## 2015-09-29 MED ORDER — FENTANYL CITRATE (PF) 100 MCG/2ML IJ SOLN
INTRAMUSCULAR | Status: AC
  Filled 2015-09-29: qty 2

## 2015-09-29 MED ORDER — BUPIVACAINE HCL (PF) 0.5 % IJ SOLN
INTRAMUSCULAR | Status: AC
  Filled 2015-09-29: qty 90

## 2015-09-29 MED ORDER — HYDROMORPHONE HCL 1 MG/ML IJ SOLN: 0.2500 mg | INTRAMUSCULAR | Status: DC | PRN

## 2015-09-29 MED ORDER — HYDROMORPHONE HCL 2 MG PO TABS: 1.0000 mg | ORAL_TABLET | Freq: Four times a day (QID) | ORAL | Status: DC | PRN

## 2015-09-29 MED ORDER — BUPIVACAINE-EPINEPHRINE (PF) 0.5% -1:200000 IJ SOLN
INTRAMUSCULAR | Status: AC
  Filled 2015-09-29: qty 90

## 2015-09-29 MED ORDER — OXYCODONE HCL 5 MG/5ML PO SOLN: 5.0000 mg | Freq: Once | ORAL | Status: DC | PRN

## 2015-09-29 MED ORDER — ONDANSETRON HCL 4 MG/2ML IJ SOLN: 4.0000 mg | Freq: Four times a day (QID) | INTRAMUSCULAR | Status: DC | PRN

## 2015-09-29 MED ORDER — SCOPOLAMINE 1 MG/3DAYS TD PT72: 1.0000 | MEDICATED_PATCH | Freq: Once | TRANSDERMAL | Status: DC | PRN

## 2015-09-29 MED ORDER — SODIUM CHLORIDE 0.9 % IR SOLN
Status: DC | PRN
  Administered 2015-09-29: 2000 mL

## 2015-09-29 MED ORDER — DEXAMETHASONE SODIUM PHOSPHATE 10 MG/ML IJ SOLN
INTRAMUSCULAR | Status: AC
  Filled 2015-09-29: qty 1

## 2015-09-29 MED ORDER — LIDOCAINE HCL (CARDIAC) 20 MG/ML IV SOLN
INTRAVENOUS | Status: AC
  Filled 2015-09-29: qty 5

## 2015-09-29 MED ORDER — DEXTROSE-NACL 5-0.45 % IV SOLN: INTRAVENOUS | Status: DC

## 2015-09-29 MED ORDER — DEXTROSE 5 % IV SOLN
3.0000 g | INTRAVENOUS | Status: AC
  Administered 2015-09-29: 2 g via INTRAVENOUS

## 2015-09-29 MED ORDER — ONDANSETRON HCL 4 MG/2ML IJ SOLN
INTRAMUSCULAR | Status: DC | PRN
  Administered 2015-09-29: 4 mg via INTRAVENOUS

## 2015-09-29 MED ORDER — CEFAZOLIN SODIUM-DEXTROSE 2-3 GM-% IV SOLR
INTRAVENOUS | Status: AC
  Filled 2015-09-29: qty 50

## 2015-09-29 MED ORDER — MIDAZOLAM HCL 2 MG/2ML IJ SOLN
1.0000 mg | INTRAMUSCULAR | Status: DC | PRN
  Administered 2015-09-29: 2 mg via INTRAVENOUS

## 2015-09-29 MED ORDER — LACTATED RINGERS IV SOLN
INTRAVENOUS | Status: DC
  Administered 2015-09-29 (×2): via INTRAVENOUS

## 2015-09-29 MED ORDER — CHLORHEXIDINE GLUCONATE 4 % EX LIQD: 60.0000 mL | Freq: Once | CUTANEOUS | Status: DC

## 2015-09-29 MED ORDER — FENTANYL CITRATE (PF) 100 MCG/2ML IJ SOLN
50.0000 ug | INTRAMUSCULAR | Status: DC | PRN
  Administered 2015-09-29: 50 ug via INTRAVENOUS
  Administered 2015-09-29: 100 ug via INTRAVENOUS

## 2015-09-29 MED ORDER — GLYCOPYRROLATE 0.2 MG/ML IJ SOLN: 0.2000 mg | Freq: Once | INTRAMUSCULAR | Status: DC | PRN

## 2015-09-29 MED ORDER — HYDROMORPHONE HCL 2 MG PO TABS
2.0000 mg | ORAL_TABLET | Freq: Once | ORAL | Status: AC | PRN
  Administered 2015-09-29: 2 mg via ORAL

## 2015-09-29 MED ORDER — MIDAZOLAM HCL 2 MG/2ML IJ SOLN
INTRAMUSCULAR | Status: AC
  Filled 2015-09-29: qty 2

## 2015-09-29 MED ORDER — DEXAMETHASONE SODIUM PHOSPHATE 10 MG/ML IJ SOLN
INTRAMUSCULAR | Status: DC | PRN
  Administered 2015-09-29: 4 mg via INTRAVENOUS

## 2015-09-29 MED ORDER — ONDANSETRON HCL 4 MG/2ML IJ SOLN
INTRAMUSCULAR | Status: AC
  Filled 2015-09-29: qty 2

## 2015-09-29 MED ORDER — LIDOCAINE HCL (CARDIAC) 20 MG/ML IV SOLN
INTRAVENOUS | Status: DC | PRN
  Administered 2015-09-29: 50 mg via INTRAVENOUS

## 2015-09-29 SURGICAL SUPPLY — 44 items
BANDAGE ACE 6X5 VEL STRL LF (GAUZE/BANDAGES/DRESSINGS) ×4 IMPLANT
BLADE 4.2CUDA (BLADE) IMPLANT
BLADE CUTTER GATOR 3.5 (BLADE) ×3 IMPLANT
BLADE GREAT WHITE 4.2 (BLADE) IMPLANT
BLADE GREAT WHITE 4.2MM (BLADE)
BNDG COHESIVE 6X5 TAN STRL LF (GAUZE/BANDAGES/DRESSINGS) ×4 IMPLANT
DRAPE ARTHROSCOPY W/POUCH 114 (DRAPES) ×4 IMPLANT
DURAPREP 26ML APPLICATOR (WOUND CARE) ×4 IMPLANT
ELECT MENISCUS 165MM 90D (ELECTRODE) IMPLANT
ELECT REM PT RETURN 9FT ADLT (ELECTROSURGICAL)
ELECTRODE REM PT RTRN 9FT ADLT (ELECTROSURGICAL) IMPLANT
GAUZE SPONGE 4X4 12PLY STRL (GAUZE/BANDAGES/DRESSINGS) ×4 IMPLANT
GAUZE XEROFORM 1X8 LF (GAUZE/BANDAGES/DRESSINGS) ×4 IMPLANT
GLOVE BIO SURGEON STRL SZ7.5 (GLOVE) ×4 IMPLANT
GLOVE BIO SURGEON STRL SZ8.5 (GLOVE) ×4 IMPLANT
GLOVE BIOGEL PI IND STRL 7.0 (GLOVE) ×2 IMPLANT
GLOVE BIOGEL PI IND STRL 8 (GLOVE) ×2 IMPLANT
GLOVE BIOGEL PI IND STRL 9 (GLOVE) ×2 IMPLANT
GLOVE BIOGEL PI INDICATOR 7.0 (GLOVE) ×4
GLOVE BIOGEL PI INDICATOR 8 (GLOVE) ×2
GLOVE BIOGEL PI INDICATOR 9 (GLOVE) ×2
GLOVE ECLIPSE 6.5 STRL STRAW (GLOVE) ×3 IMPLANT
GOWN STRL REUS W/ TWL LRG LVL3 (GOWN DISPOSABLE) ×4 IMPLANT
GOWN STRL REUS W/TWL LRG LVL3 (GOWN DISPOSABLE) ×8
GOWN STRL REUS W/TWL XL LVL3 (GOWN DISPOSABLE) ×4 IMPLANT
IV NS IRRIG 3000ML ARTHROMATIC (IV SOLUTION) ×4 IMPLANT
KNEE WRAP E Z 3 GEL PACK (MISCELLANEOUS) ×4 IMPLANT
MANIFOLD NEPTUNE II (INSTRUMENTS) IMPLANT
NDL SAFETY ECLIPSE 18X1.5 (NEEDLE) ×2 IMPLANT
NEEDLE HYPO 18GX1.5 SHARP (NEEDLE) ×4
PACK ARTHROSCOPY DSU (CUSTOM PROCEDURE TRAY) ×4 IMPLANT
PACK BASIN DAY SURGERY FS (CUSTOM PROCEDURE TRAY) ×4 IMPLANT
PAD ALCOHOL SWAB (MISCELLANEOUS) ×4 IMPLANT
PAD CAST 4YDX4 CTTN HI CHSV (CAST SUPPLIES) ×1 IMPLANT
PADDING CAST COTTON 4X4 STRL (CAST SUPPLIES) ×4
PENCIL BUTTON HOLSTER BLD 10FT (ELECTRODE) IMPLANT
SET ARTHROSCOPY TUBING (MISCELLANEOUS) ×4
SET ARTHROSCOPY TUBING LN (MISCELLANEOUS) ×2 IMPLANT
SLEEVE SCD COMPRESS KNEE MED (MISCELLANEOUS) IMPLANT
SYR 3ML 18GX1 1/2 (SYRINGE) IMPLANT
SYR 5ML LL (SYRINGE) ×4 IMPLANT
TOWEL OR 17X24 6PK STRL BLUE (TOWEL DISPOSABLE) ×4 IMPLANT
WAND STAR VAC 90 (SURGICAL WAND) IMPLANT
WATER STERILE IRR 1000ML POUR (IV SOLUTION) ×4 IMPLANT

## 2015-09-29 NOTE — Discharge Instructions (Addendum)
Knee Arthroscopy Knee arthroscopy is a surgical procedure that is used to examine the inside of your knee joint and repair any damage. The surgeon puts a small, lighted instrument with a camera on the tip (arthroscope) through a small incision in your knee. The camera sends pictures to a monitor in the operating room. Your surgeon uses those pictures to guide the surgical instruments through other incisions to the area of damage. Knee arthroscopy can be used to treat many types of knee problems. It may be used:  To repair a torn ligament.  To repair or remove damaged tissue.  To remove a fluid-filled sac (cyst) from your knee. LET [facility] CARE PROVIDER KNOW ABOUT:  Any allergies you have.  All medicines you are taking, including vitamins, herbs, eye drops, creams, and over-the-counter medicines.  Previous problems you or members of your family have had with the use of anesthetics.  Any blood disorders you have.  Previous surgeries you have had.  Any medical conditions you may have. RISKS AND COMPLICATIONS Generally, this is a safe procedure. However, problems may occur, including:  Infection.  Bleeding.  Damage to blood vessels, nerves, or structures of your knee.  A blood clot that forms in your leg and travels to your lung.  Failure to relieve symptoms. BEFORE THE PROCEDURE  Ask your health care provider about:  Changing or stopping your regular medicines. This is especially important if you are taking diabetes medicines or blood thinners.  Taking medicines such as aspirin and ibuprofen. These medicines can thin your blood. Do not take these medicines before your procedure if your health care provider instructs you not to.  Follow your health care provider's instructions about eating or drinking restrictions.  Plan to have someone take you home after the procedure.  If you go home right after the procedure, plan to have someone with you for 24 hours.  Do not  drink alcohol unless your health care provider says that you can.  Do not use any tobacco products, including cigarettes, chewing tobacco, or electronic cigarettes unless your health care provider says that you can. If you need help quitting, ask your health care provider.  You may have a physical exam. PROCEDURE  An IV tube will be inserted into one of your veins.  You will be given one or more of the following:  A medicine that helps you relax (sedative).  A medicine that numbs the area (local anesthetic).  A medicine that makes you fall asleep (general anesthetic).  A medicine that is injected into your spine that numbs the area below and slightly above the injection site (spinal anesthetic).  A medicine that is injected into an area of your body that numbs everything below the injection site (regional anesthetic).  A cuff may be placed around your upper leg to slow bleeding during the procedure.  The surgeon will make a small number of incisions around your knee.  Your knee joint will be flushed and filled with a germ-free (sterile) solution.  The arthroscope will be passed through an incision into your knee joint.  More instruments will be passed through other incisions to repair your knee as needed.  The fluid will be removed from your knee.  The incisions will be closed with adhesive strips or stitches (sutures).  A bandage (dressing) will be placed over your knee. The procedure may vary among health care providers and hospitals. AFTER THE PROCEDURE  Your blood pressure, heart rate, breathing rate and blood oxygen level  will be monitored often until the medicines you were given have worn off. °· You may be given medicine for pain. °· You may get crutches to help you walk without using your knee to support your body weight. °· You may have to wear compression stockings. These stocking help to prevent blood clots and reduce swelling in your legs. °  °This information is  not intended to replace advice given to you by your health care provider. Make sure you discuss any questions you have with your health care provider. °  °Document Released: 09/14/2000 Document Revised: 02/01/2015 Document Reviewed: 09/13/2014 °Elsevier Interactive Patient Education ©2016 [employer] ° ° ° °Post Anesthesia Home Care Instructions ° °Activity: °Get plenty of rest for the remainder of the day. A responsible adult should stay with you for 24 hours following the procedure.  °For the next 24 hours, DO NOT: °-Drive a car °-Operate machinery °-Drink alcoholic beverages °-Take any medication unless instructed by your physician °-Make any legal decisions or sign important papers. ° °Meals: °Start with liquid foods such as gelatin or soup. Progress to regular foods as tolerated. Avoid greasy, spicy, heavy foods. If nausea and/or vomiting occur, drink only clear liquids until the nausea and/or vomiting subsides. Call your physician if vomiting continues. ° °Special Instructions/Symptoms: °Your throat may feel dry or sore from the anesthesia or the breathing tube placed in your throat during surgery. If this causes discomfort, gargle with warm salt water. The discomfort should disappear within 24 hours. ° °If you had a scopolamine patch placed behind your ear for the management of post- operative nausea and/or vomiting: ° °1. The medication in the patch is effective for 72 hours, after which it should be removed.  Wrap patch in a tissue and discard in the trash. Wash hands thoroughly with soap and water. °2. You may remove the patch earlier than 72 hours if you experience unpleasant side effects which may include dry mouth, dizziness or visual disturbances. °3. Avoid touching the patch. Wash your hands with soap and water after contact with the patch. °  ° ° °

## 2015-09-29 NOTE — Op Note (Signed)
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 Mayer Camel M.D.  Assist: Kerry Hough. Barton Dubois  (present throughout entire procedure and necessary for timely completion of the procedure) Anes: General LMA  EBL: Minimal  Fluids: 800 cc   Indications: 59 year old woman with severe diabetes A1c a few months ago was 14 she is gotten a down to 9 preoperatively. History of catching popping and pain in the left knee has failed conservative treatment with anti-inflammatory medicines cortisone injection and physical therapy MRI scan shows large medial and posterior horn complex meniscal tear as well as chondromalacia.. Pt has failed conservative treatment with anti-inflammatory medicines, physical therapy, and modified activites but did get good temporarily from an intra-articular cortisone injection. Pain has recurred and patient desires elective arthroscopic evaluation and treatment of knee. Risks and benefits of surgery have been discussed and questions answered.  Procedure: Patient identified by arm band and taken to the operating room at the day surgery Center. The appropriate anesthetic monitors were attached, and General LMA anesthesia was induced without difficulty. Lateral post was applied to the table and the lower extremity was prepped and draped in usual sterile fashion from the ankle to the midthigh. Time out procedure was performed. We began the operation by making standard inferior lateral and inferior medial peripatellar portals with a #11 blade allowing introduction of the arthroscope through the inferior lateral portal and the out flow to the inferior medial portal. Pump pressure was set at 100 mmHg and diagnostic  arthroscopy  revealed articular cartilage to the patella was normal grade 2 chondromalacia the trochlea lightly debrided moving into the medial compartment the articular cartilage of the medial tibial plateau had grade 3 chondromalacia near the meniscus medial edge over 1 cm area this is debrider back to a stable margin. The medial meniscus did have complex tearing along the tract of the medial collateral ligament going posteriorly and this was likewise debrided with a 3.5 mm Gator sucker shaver and thoroughly probed. There were some small fragments in the gutter the role likewise removed. Anterior cruciate ligament and PCL are intact. Lateral compartment was in excellent condition.. The knee was irrigated out normal saline solution. A dressing of xerofoam 4 x 4 dressing sponges, web roll and an Ace wrap was applied. The patient was awakened extubated and taken to the recovery without difficulty.    Signed: Kerin Salen, MD

## 2015-09-29 NOTE — Interval H&P Note (Signed)
History and Physical Interval Note:  09/29/2015 7:15 AM  Holly Daniels  has presented today for surgery, with the diagnosis of [place]  The various methods of treatment have been discussed with the patient and family. After consideration of risks, benefits and other options for treatment, the patient has consented to  Procedure(s): ARTHROSCOPY KNEE (Left) as a surgical intervention .  The patient's history has been reviewed, patient examined, no change in status, stable for surgery.  I have reviewed the patient's chart and labs.  Questions were answered to the patient's satisfaction.     Kerin Salen

## 2015-09-29 NOTE — Anesthesia Procedure Notes (Signed)
Procedure Name: LMA Insertion Date/Time: 09/29/2015 7:30 AM Performed by: Lieutenant Diego Pre-anesthesia Checklist: Patient identified, Emergency Drugs available, Suction available and Patient being monitored Patient Re-evaluated:Patient Re-evaluated prior to inductionOxygen Delivery Method: Circle System Utilized Preoxygenation: Pre-oxygenation with 100% oxygen Intubation Type: IV induction Ventilation: Mask ventilation without difficulty LMA: LMA inserted LMA Size: 4.0 Number of attempts: 1 Airway Equipment and Method: Bite block Placement Confirmation: positive ETCO2 and breath sounds checked- equal and bilateral Tube secured with: Tape Dental Injury: Teeth and Oropharynx as per pre-operative assessment

## 2015-09-29 NOTE — Anesthesia Postprocedure Evaluation (Signed)
Anesthesia Post Note  Patient: Holly Daniels  Procedure(s) Performed: Procedure(s) (LRB): KNEE ARTHROSCOPY WITH MEDIAL MENISECTOMY AND CHONDROPLASTY (Left) CHONDROPLASTY (Left)  Patient location during evaluation: PACU Anesthesia Type: General Level of consciousness: awake and alert and patient cooperative Pain management: pain level controlled Vital Signs Assessment: post-procedure vital signs reviewed and stable Respiratory status: spontaneous breathing and respiratory function stable Cardiovascular status: stable Anesthetic complications: no    Last Vitals:  Filed Vitals:   09/29/15 0830 09/29/15 0845  BP: 121/59 126/62  Pulse: 76 71  Temp:    Resp: 17 22    Last Pain:  Filed Vitals:   09/29/15 0849  PainSc: [place]

## 2015-09-29 NOTE — Anesthesia Preprocedure Evaluation (Signed)
Anesthesia Evaluation  Patient identified by MRN, date of birth, ID band Patient awake    Reviewed: Allergy & Precautions, NPO status , Patient's Chart, lab work & pertinent test results  Airway Mallampati: II   Neck ROM: full    Dental   Pulmonary sleep apnea , former smoker,    breath sounds clear to auscultation       Cardiovascular hypertension,  Rhythm:regular Rate:Normal     Neuro/Psych  Headaches, Anxiety  Neuromuscular disease    GI/Hepatic GERD  ,  Endo/Other  diabetes, Type 2obese  Renal/GU      Musculoskeletal  (+) Fibromyalgia -  Abdominal   Peds  Hematology   Anesthesia Other Findings   Reproductive/Obstetrics                             Anesthesia Physical Anesthesia Plan  ASA: II  Anesthesia Plan: General   Post-op Pain Management:    Induction: Intravenous  Airway Management Planned: LMA  Additional Equipment:   Intra-op Plan:   Post-operative Plan:   Informed Consent: I have reviewed the patients History and Physical, chart, labs and discussed the procedure including the risks, benefits and alternatives for the proposed anesthesia with the patient or authorized representative who has indicated his/her understanding and acceptance.     Plan Discussed with: CRNA, Anesthesiologist and Surgeon  Anesthesia Plan Comments:         Anesthesia Quick Evaluation

## 2015-09-29 NOTE — Transfer of Care (Signed)
Immediate Anesthesia Transfer of Care Note  Patient: Holly Daniels  Procedure(s) Performed: Procedure(s): KNEE ARTHROSCOPY WITH MEDIAL MENISECTOMY AND CHONDROPLASTY (Left) CHONDROPLASTY (Left)  Patient Location: PACU  Anesthesia Type:General  Level of Consciousness: awake  Airway & Oxygen Therapy: Patient Spontanous Breathing and Patient connected to face mask oxygen  Post-op Assessment: Report given to RN and Post -op Vital signs reviewed and stable  Post vital signs: Reviewed and stable  Last Vitals:  Filed Vitals:   09/29/15 0648  BP: 127/67  Pulse: 78  Temp: 36.7 C  Resp: 20    Complications: No apparent anesthesia complications

## 2015-09-30 ENCOUNTER — Encounter (HOSPITAL_BASED_OUTPATIENT_CLINIC_OR_DEPARTMENT_OTHER): Payer: Self-pay | Admitting: Orthopedic Surgery

## 2015-12-01 ENCOUNTER — Other Ambulatory Visit: Payer: Self-pay | Admitting: Cardiology

## 2015-12-01 DIAGNOSIS — R079 Chest pain, unspecified: Secondary | ICD-10-CM

## 2015-12-05 ENCOUNTER — Ambulatory Visit (HOSPITAL_COMMUNITY)
Admission: RE | Admit: 2015-12-05 | Discharge: 2015-12-05 | Disposition: A | Discharge status: Home / Self Care | Payer: 59 | Source: Ambulatory Visit | Attending: Cardiology | Admitting: Cardiology

## 2015-12-05 ENCOUNTER — Encounter (HOSPITAL_COMMUNITY)
Admission: RE | Admit: 2015-12-05 | Discharge: 2015-12-05 | Disposition: A | Discharge status: Home / Self Care | Payer: 59 | Source: Ambulatory Visit | Attending: Cardiology | Admitting: Cardiology

## 2015-12-05 DIAGNOSIS — R079 Chest pain, unspecified: Secondary | ICD-10-CM | POA: Diagnosis not present

## 2015-12-05 LAB — HEPATIC FUNCTION PANEL
ALBUMIN: 3.6 g/dL (ref 3.5–5.0)
ALT: 17 U/L (ref 14–54)
AST: 20 U/L (ref 15–41)
Alkaline Phosphatase: 69 U/L (ref 38–126)
Bilirubin, Direct: <0.1 mg/dL — ABNORMAL LOW (ref 0.1–0.5)
Total Bilirubin: 0.6 mg/dL (ref 0.3–1.2)
Total Protein: 6.6 g/dL (ref 6.5–8.1)

## 2015-12-05 LAB — BASIC METABOLIC PANEL
ANION GAP: 9 (ref 5–15)
BUN: 17 mg/dL (ref 6–20)
CALCIUM: 9.4 mg/dL (ref 8.9–10.3)
CO2: 26 mmol/L (ref 22–32)
Chloride: 105 mmol/L (ref 101–111)
Creatinine, Ser: 0.92 mg/dL (ref 0.44–1.00)
Glucose, Bld: 244 mg/dL — ABNORMAL HIGH (ref 65–99)
POTASSIUM: 3.9 mmol/L (ref 3.5–5.1)
Sodium: 140 mmol/L (ref 135–145)

## 2015-12-05 LAB — LIPID PANEL
CHOL/HDL RATIO: 5.5 ratio
Cholesterol: 252 mg/dL — ABNORMAL HIGH (ref 0–200)
HDL: 46 mg/dL (ref >40)
LDL CALC: 171 mg/dL — AB (ref 0–99)
TRIGLYCERIDES: 175 mg/dL — AB (ref <150)
VLDL: 35 mg/dL (ref 0–40)

## 2015-12-05 LAB — TSH: TSH: 0.568 u[IU]/mL (ref 0.350–4.500)

## 2015-12-05 MED ORDER — REGADENOSON 0.4 MG/5ML IV SOLN
0.4000 mg | Freq: Once | INTRAVENOUS | Status: AC
  Administered 2015-12-05: 0.4 mg via INTRAVENOUS

## 2015-12-05 MED ORDER — TECHNETIUM TC 99M SESTAMIBI GENERIC - CARDIOLITE
10.0000 | Freq: Once | INTRAVENOUS | Status: AC | PRN
  Administered 2015-12-05: 10 via INTRAVENOUS

## 2015-12-05 MED ORDER — REGADENOSON 0.4 MG/5ML IV SOLN
INTRAVENOUS | Status: AC
  Filled 2015-12-05: qty 5

## 2015-12-05 MED ORDER — TECHNETIUM TC 99M SESTAMIBI GENERIC - CARDIOLITE
30.0000 | Freq: Once | INTRAVENOUS | Status: AC | PRN
  Administered 2015-12-05: 30 via INTRAVENOUS

## 2015-12-06 LAB — HEMOGLOBIN A1C
Hgb A1c MFr Bld: 9.1 % — ABNORMAL HIGH (ref 4.8–5.6)
Mean Plasma Glucose: 214 mg/dL

## 2016-11-06 ENCOUNTER — Ambulatory Visit: Payer: 59 | Admitting: Registered"

## 2017-10-16 DIAGNOSIS — E1142 Type 2 diabetes mellitus with diabetic polyneuropathy: Secondary | ICD-10-CM | POA: Diagnosis not present

## 2017-10-16 DIAGNOSIS — E1165 Type 2 diabetes mellitus with hyperglycemia: Secondary | ICD-10-CM | POA: Diagnosis not present

## 2017-10-16 DIAGNOSIS — M545 Low back pain: Secondary | ICD-10-CM | POA: Diagnosis not present

## 2017-10-16 DIAGNOSIS — I10 Essential (primary) hypertension: Secondary | ICD-10-CM | POA: Diagnosis not present

## 2017-12-09 DIAGNOSIS — E782 Mixed hyperlipidemia: Secondary | ICD-10-CM | POA: Diagnosis not present

## 2017-12-09 DIAGNOSIS — I10 Essential (primary) hypertension: Secondary | ICD-10-CM | POA: Diagnosis not present

## 2017-12-09 DIAGNOSIS — R5383 Other fatigue: Secondary | ICD-10-CM | POA: Diagnosis not present

## 2017-12-09 DIAGNOSIS — E1165 Type 2 diabetes mellitus with hyperglycemia: Secondary | ICD-10-CM | POA: Diagnosis not present

## 2017-12-09 DIAGNOSIS — G4733 Obstructive sleep apnea (adult) (pediatric): Secondary | ICD-10-CM | POA: Diagnosis not present

## 2017-12-09 DIAGNOSIS — F17290 Nicotine dependence, other tobacco product, uncomplicated: Secondary | ICD-10-CM | POA: Diagnosis not present

## 2017-12-10 ENCOUNTER — Other Ambulatory Visit: Payer: Self-pay | Admitting: Internal Medicine

## 2017-12-10 ENCOUNTER — Ambulatory Visit
Admission: RE | Admit: 2017-12-10 | Discharge: 2017-12-10 | Disposition: A | Discharge status: Home / Self Care | Payer: BLUE CROSS/BLUE SHIELD | Source: Ambulatory Visit | Attending: Internal Medicine | Admitting: Internal Medicine

## 2017-12-10 DIAGNOSIS — M25551 Pain in right hip: Secondary | ICD-10-CM

## 2018-03-11 DIAGNOSIS — E782 Mixed hyperlipidemia: Secondary | ICD-10-CM | POA: Diagnosis not present

## 2018-03-11 DIAGNOSIS — G4733 Obstructive sleep apnea (adult) (pediatric): Secondary | ICD-10-CM | POA: Diagnosis not present

## 2018-03-11 DIAGNOSIS — M545 Low back pain: Secondary | ICD-10-CM | POA: Diagnosis not present

## 2018-03-11 DIAGNOSIS — E1165 Type 2 diabetes mellitus with hyperglycemia: Secondary | ICD-10-CM | POA: Diagnosis not present

## 2018-03-24 DIAGNOSIS — R5383 Other fatigue: Secondary | ICD-10-CM | POA: Diagnosis not present

## 2018-03-24 DIAGNOSIS — J019 Acute sinusitis, unspecified: Secondary | ICD-10-CM | POA: Diagnosis not present

## 2018-03-24 DIAGNOSIS — J00 Acute nasopharyngitis [common cold]: Secondary | ICD-10-CM | POA: Diagnosis not present

## 2018-05-08 DIAGNOSIS — J302 Other seasonal allergic rhinitis: Secondary | ICD-10-CM | POA: Diagnosis not present

## 2018-07-02 DIAGNOSIS — I10 Essential (primary) hypertension: Secondary | ICD-10-CM | POA: Diagnosis not present

## 2018-07-02 DIAGNOSIS — E1165 Type 2 diabetes mellitus with hyperglycemia: Secondary | ICD-10-CM | POA: Diagnosis not present

## 2018-07-02 DIAGNOSIS — E669 Obesity, unspecified: Secondary | ICD-10-CM | POA: Diagnosis not present

## 2018-07-02 DIAGNOSIS — E782 Mixed hyperlipidemia: Secondary | ICD-10-CM | POA: Diagnosis not present

## 2018-07-02 DIAGNOSIS — Z23 Encounter for immunization: Secondary | ICD-10-CM | POA: Diagnosis not present

## 2018-07-02 DIAGNOSIS — M545 Low back pain: Secondary | ICD-10-CM | POA: Diagnosis not present

## 2018-07-18 ENCOUNTER — Other Ambulatory Visit: Payer: Self-pay

## 2018-07-18 ENCOUNTER — Ambulatory Visit: Payer: BLUE CROSS/BLUE SHIELD | Attending: Cardiology | Admitting: Rehabilitation

## 2018-07-18 ENCOUNTER — Telehealth: Payer: Self-pay | Admitting: Rehabilitation

## 2018-07-18 ENCOUNTER — Encounter: Payer: Self-pay | Admitting: Rehabilitation

## 2018-07-18 DIAGNOSIS — R2689 Other abnormalities of gait and mobility: Secondary | ICD-10-CM

## 2018-07-18 DIAGNOSIS — G8929 Other chronic pain: Secondary | ICD-10-CM | POA: Diagnosis not present

## 2018-07-18 DIAGNOSIS — M6281 Muscle weakness (generalized): Secondary | ICD-10-CM | POA: Diagnosis not present

## 2018-07-18 DIAGNOSIS — M545 Low back pain, unspecified: Secondary | ICD-10-CM

## 2018-07-18 DIAGNOSIS — R293 Abnormal posture: Secondary | ICD-10-CM

## 2018-07-18 NOTE — Therapy (Signed)
[facility] [address] [place], [place], [address] Phone: [phone]   Fax:  [phone]  Physical Therapy Evaluation  Patient Details  Name: Holly Daniels MRN: [id number] Date of Birth: Sep 26, 1956 Referring Provider (PT): Latanya Presser, MD   Encounter Date: 07/18/2018  PT End of Session - 07/18/18 1515    Visit Number  1    Number of Visits  17    Date for PT Re-Evaluation  46/65/99   cert written for 74, plan to see for 60 days   Authorization Type  [employer]    Authorization - Visit Number  1    Authorization - Number of Visits  90    PT Start Time  1400    PT Stop Time  1455    PT Time Calculation (min)  55 min    Activity Tolerance  Patient limited by pain    Behavior During Therapy  [facility] for tasks assessed/performed       Past Medical History:  Diagnosis Date  . Anxiety   . Diabetes mellitus    oral meds  . Fibromyalgia   . GERD (gastroesophageal reflux disease)   . Headache(784.0)    sinus  . History of C-section    x2  . Hypertension   . OSA (obstructive sleep apnea)     Past Surgical History:  Procedure Laterality Date  . CESAREAN SECTION     x2  . CHOLECYSTECTOMY    . CHONDROPLASTY Left 09/29/2015   Procedure: CHONDROPLASTY;  Surgeon: Frederik Pear, MD;  Location: [facility];  Service: Orthopedics;  Laterality: Left;  . HYSTEROSCOPY W/D&C  01/14/2012   Procedure: DILATATION AND CURETTAGE /HYSTEROSCOPY;  Surgeon: Delice Lesch, MD;  Location: [facility];  Service: Gynecology;  Laterality: N/A;  . KNEE ARTHROSCOPY WITH MEDIAL MENISECTOMY Left 09/29/2015   Procedure: KNEE ARTHROSCOPY WITH MEDIAL MENISECTOMY AND CHONDROPLASTY;  Surgeon: Frederik Pear, MD;  Location: [facility];  Service: Orthopedics;  Laterality: Left;  . vulvar cyst      There were no vitals filed for this visit.   Subjective Assessment - 07/18/18 1407    Subjective  "My back hurts really bad.  It  hurts every day.  This has been going on for a long time.  Its been going on for over 20 years, gotten worse over the last 10 years."    Patient is accompained by:  Family member   [employer] activities;Walking    How long can you sit comfortably?  as long as needed    How long can you stand comfortably?  8 mins    How long can you walk comfortably?  approx 100' before having sharp pain and feels like back "locks up."     Patient Stated Goals  "Get rid of pain, get back to doing things you want to do."     Currently in Pain?  Yes    Pain Score  5    worse at night, wakes her if doesn't take meds   Pain Location  Back    Pain Orientation  Lower;Right;Left   R>L   Pain Descriptors / Indicators  Sharp;Burning;Aching;Pressure    Pain Type  Chronic pain    Pain Radiating Towards  into hips and legs down to feet    Pain Onset  More than a month ago    Pain Frequency  Constant    Aggravating Factors   standing, walking, lying  flat, driving    Pain Relieving Factors  pain medication, husband rubs back, heat          [facility] PT Assessment - 07/18/18 1415      Assessment   Medical Diagnosis  Low back pain    Referring Provider (PT)  Latanya Presser, MD    Onset Date/Surgical Date  --   20 years, worse over the last 10 years   Prior Therapy  previously for knee injury      Precautions   Precautions  Back    Required Braces or Orthoses  Other Brace/Splint    Other Brace/Splint  Wears velcro back support brace       Restrictions   Weight Bearing Restrictions  No      Balance Screen   Has the patient fallen in the past 6 months  No      [place] residence    Living Arrangements  Spouse/significant other    Available Help at Discharge  Family;Available 24 hours/day    Type of Home  House    Home Access  Stairs to enter    Entrance Stairs-Number of Steps  3    Entrance Stairs-Rails  Right;Left;Can reach both    Home Layout   Two level;Bed/bath upstairs    Alternate Level Stairs-Number of Steps  13    Alternate Level Stairs-Rails  Right    Home Equipment  Walker - 2 wheels;Shower seat   hurry cane, tub/shower      Prior Function   Level of Independence  Independent with basic ADLs;Independent   uses sock aid, doesn't sweep/mob/vacuum   Vocation  Retired    Leisure  Wants to get back to going to football games, travel      Editor, commissioning  Impaired Detail    Light Touch Impaired Details  Impaired RLE;Impaired LLE   hx of DM II w/ neuropathy    Hot/Cold  Appears Intact    Proprioception  Appears Intact      Coordination   Gross Motor Movements are Fluid and Coordinated  Yes    Fine Motor Movements are Fluid and Coordinated  Yes    Heel Shin Test  R over L more difficult due to pain and weakness      ROM / Strength   AROM / PROM / Strength  Strength      Strength   Overall Strength  Deficits    Overall Strength Comments  Seated MMT:  R hip flex 3+/5 (painful), L hip flex 4/5, B knee ext 4/5, B knee flex 5/5, B ankle DF 4/5, B ankle PF 5/5.  Pt with likely hip abd weakness based on gait pattern.       Flexibility   Soft Tissue Assessment /Muscle Length  yes    Hamstrings  B hamstrings very tight, esp LLE    ITB  Likely very tight, rubs R ITB often to relieve pain    Piriformis  L>R tightness (extreme tenderness)      Palpation   Palpation comment  In prone, Pt extremely point tender over B L2 down to sacrum as well as over SI joints B       Ambulation/Gait   Ambulation/Gait  Yes    Ambulation/Gait Assistance  6: Modified independent (Device/Increase time);5: Supervision    Ambulation/Gait Assistance Details  Did not formally assess gait, however do note Trendelenburg gait pattern along with antalgic gait and wide BOS.  Ambulation Distance (Feet)  100 Feet    Assistive device  None    Gait Pattern  Step-through pattern;Decreased stride length;Trendelenburg;Lateral hip instability;Wide  base of support    Ambulation Surface  Level;Indoor         Access Code: [email or web address]  URL: [email or web address]  Date: 07/18/2018  Prepared by: Cameron Sprang   Exercises  Single Knee to Chest Stretch - 3 reps - 1 sets - 20 hold - 3x daily - 7x weekly  Supine Piriformis Stretch with Leg Straight - 3 reps - 1 sets - 20 hold - 3x daily - 7x weekly  Seated Hamstring Stretch (BKA) - 3 reps - 1 sets - 20 hold - 3x daily - 7x weekly         Objective measurements completed on examination: See above findings.              PT Education - 07/18/18 1515    Education Details  POC, goals, evaluation findings, initial HEP for BLE flexibility    Person(s) Educated  Patient;Spouse    Methods  Explanation;Demonstration;Handout    Comprehension  Verbalized understanding;Returned demonstration       PT Short Term Goals - 07/18/18 1523      PT SHORT TERM GOAL #1   Title  Pt/husband will be independent with initial HEP in order to indicate improved flexibility and functional mobility.  (Target Date: 08/17/18)    Time  4    Period  Weeks    Status  New    Target Date  08/17/18      PT SHORT TERM GOAL #2   Title  Will assess gait speed and write appropriate STG/LTG as needed.      Time  4    Period  Weeks    Status  New      PT SHORT TERM GOAL #3   Title  Will assess 5TSS and write appropriate goal to indicate improved functional strength.     Time  4    Period  Weeks    Status  New      PT SHORT TERM GOAL #4   Title  Will assess 6MWT with rollator and improve distance by 75' from baseline in order to indicate improved functional endurance.      Time  4    Period  Weeks    Status  New      PT SHORT TERM GOAL #5   Title  Pt will demonstrate improved sleeping position in order to indicate decreased pain at night.     Time  4    Period  Weeks    Status  New      Additional Short Term Goals   Additional Short Term Goals  Yes      PT SHORT TERM GOAL #6    Title  Pt will report no more than 7/10 pain with functional mobility in order to indicate improved function and flexibility.     Time  4    Period  Weeks    Status  New        PT Long Term Goals - 07/18/18 1528      PT LONG TERM GOAL #1   Title  Pt/husband will be independent with final HEP in order to indicate improved flexibility and improved functional mobility.  (Target Date: 09/16/18)    Time  8    Period  Weeks    Status  New    Target Date  09/16/18  PT LONG TERM GOAL #2   Title  Pt will improve 6MWT distance by 150' from baseline with use of rollator in order to indicate improved functional endurance.     Time  8    Period  Weeks    Status  New      PT LONG TERM GOAL #3   Title  Pt will report no more than 5/10 pain with functional mobility in order to indicate improved flexibility and function.     Time  8    Period  Weeks    Status  New             Plan - 07/18/18 1516    Clinical Impression Statement  Pt presents with chronic back pain that has been ongoing for 20 years, but has increased over the last 10 years.  Note that she has to use a scooter for distances longer than approx 100-200' and is unable to do many household ADLs including getting socks on without sock aide.  She is unable to participate in community/leisure function due to pain and decreased mobility.  Note history of HTN, DMII with peripheral neuropathy, OSA and obesity that could impact progress in therapy.  Also note that she has to sleep sitting up propped on headboard.  Upon PT evaluation, note generalized BLE weakness, esp in RLE.  Note numbness/tingling in BLEs (feet), tightness in B piriformis, hamstrings and ITB along with marked tenderness to palpation over most of lumbar spine, B SI joints, and B piriformis.  Initaited treatment during session for LE flexibility.  Pt will benefit from skilled OP neuro PT in order to address deficits.      History and Personal Factors relevant to  plan of care:  See above    Clinical Presentation  Evolving    Clinical Presentation due to:  see above    Clinical Decision Making  Moderate    Rehab Potential  Good    Clinical Impairments Affecting Rehab Potential  chronicity of deficits    PT Frequency  2x / week    PT Duration  8 weeks    PT Treatment/Interventions  ADLs/Self Care Home Management;Aquatic Therapy;Moist Heat;Ultrasound;DME Instruction;Gait training;Stair training;Functional mobility training;Therapeutic activities;Therapeutic exercise;Balance training;Neuromuscular re-education;Patient/family education;Manual techniques;Passive range of motion;Dry needling;Energy conservation    PT Next Visit Plan  5TSS, Look at gait speed-add goal, 6MWT (she may do better with rollator), check current stretches given at eval-add as needed, hip strengthening, pelvic stability, muscle energy for pelvis, DN for low back, piriformis and ITB as able.     PT Home Exercise Plan  [email or web address]        Patient will benefit from skilled therapeutic intervention in order to improve the following deficits and impairments:  Abnormal gait, Decreased activity tolerance, Decreased balance, Decreased endurance, Decreased knowledge of use of DME, Decreased mobility, Decreased range of motion, Decreased strength, Difficulty walking, Hypomobility, Impaired perceived functional ability, Impaired flexibility, Postural dysfunction, Pain, Impaired sensation  Visit Diagnosis: Chronic bilateral low back pain, unspecified whether sciatica present  Muscle weakness (generalized)  Other abnormalities of gait and mobility  Abnormal posture     Problem List Patient Active Problem List   Diagnosis Date Noted  . Post menopausal problems 01/08/2012    Class: Temporary  . Diabetes mellitus 01/08/2012    Class: Chronic  . Hypertension 01/08/2012    Class: Chronic  . Anxiety 01/08/2012    Class: Chronic  . Sleep apnea 01/08/2012    Class: Chronic  . Fibromyalgia  syndrome 01/08/2012    Class: Chronic  . IBS (irritable bowel syndrome) 01/08/2012    Class: Chronic  . GERD (gastroesophageal reflux disease) 01/08/2012    Class: Chronic    Cameron Sprang, PT, MPT [facility] [address] [place], [place], [address] Phone: [phone]   Fax:  [phone] 07/18/18, 3:31 PM  Name: Rashmi Tallent MRN: [id number] Date of Birth: [birth year]

## 2018-07-18 NOTE — Telephone Encounter (Deleted)
Dr. Maia Petties,   I just evaluated Mrs. Holly Daniels at [facility] OP neuro for PT.  Note chronic back pain over many years, numbness and tingling into legs (which may be from DMII) however she also reports pain that increases with urination or bowel movement.  Due to chronicity of pain and nature of pain with her age, I am wondering if you would be willing to order MRI of lumbar/sacral spine.  I can see that she has not had any imaging since 2006.  Please advise.    Thanks,  Cameron Sprang, PT, MPT [facility] [address] [place], [place], [address] Phone: [phone]   Fax:  [phone] 07/18/18, 3:36 PM

## 2018-07-18 NOTE — Patient Instructions (Signed)
Access Code: [email or web address]  URL: [email or web address]  Date: 07/18/2018  Prepared by: Cameron Sprang   Exercises  Single Knee to Chest Stretch - 3 reps - 1 sets - 20 hold - 3x daily - 7x weekly  Supine Piriformis Stretch with Leg Straight - 3 reps - 1 sets - 20 hold - 3x daily - 7x weekly  Seated Hamstring Stretch (BKA) - 3 reps - 1 sets - 20 hold - 3x daily - 7x weekly

## 2018-07-18 NOTE — Telephone Encounter (Signed)
Entered in error

## 2018-07-24 ENCOUNTER — Encounter: Payer: Self-pay | Admitting: Physical Therapy

## 2018-07-24 ENCOUNTER — Ambulatory Visit: Payer: BLUE CROSS/BLUE SHIELD | Admitting: Physical Therapy

## 2018-07-24 DIAGNOSIS — R293 Abnormal posture: Secondary | ICD-10-CM | POA: Diagnosis not present

## 2018-07-24 DIAGNOSIS — M545 Low back pain, unspecified: Secondary | ICD-10-CM

## 2018-07-24 DIAGNOSIS — M6281 Muscle weakness (generalized): Secondary | ICD-10-CM

## 2018-07-24 DIAGNOSIS — R2689 Other abnormalities of gait and mobility: Secondary | ICD-10-CM | POA: Diagnosis not present

## 2018-07-24 DIAGNOSIS — G8929 Other chronic pain: Secondary | ICD-10-CM

## 2018-07-24 NOTE — Patient Instructions (Signed)
Access Code: [email or web address]  URL: [email or web address]  Date: 07/24/2018  Prepared by: Misty Stanley   Exercises  Single Knee to Chest Stretch - 3 reps - 1 sets - 20 hold - 3x daily - 7x weekly  Supine Piriformis Stretch with Leg Straight - 3 reps - 1 sets - 20 hold - 3x daily - 7x weekly  Seated Hamstring Stretch (BKA) - 3 reps - 1 sets - 20 hold - 3x daily - 7x weekly  Seated Slump Neural Tensioner - 5 reps - 2 sets - 2-3 seconds hold - 1x daily - 7x weekly

## 2018-07-24 NOTE — Therapy (Signed)
[facility] [address] [place], [place], [address] Phone: [phone]   Fax:  [phone]  Physical Therapy Treatment  Patient Details  Name: Holly Daniels MRN: [id number] Date of Birth: 12-09-1955 Referring Provider (PT): Latanya Presser, MD   Encounter Date: 07/24/2018  PT End of Session - 07/24/18 1230    Visit Number  2    Number of Visits  17    Date for PT Re-Evaluation  97/67/34   cert written for 77, plan to see for 60 days   Authorization Type  BCBS    Authorization - Visit Number  2    Authorization - Number of Visits  90    PT Start Time  1104    PT Stop Time  1145    PT Time Calculation (min)  41 min    Activity Tolerance  Patient limited by pain    Behavior During Therapy  [facility] for tasks assessed/performed       Past Medical History:  Diagnosis Date  . Anxiety   . Diabetes mellitus    oral meds  . Fibromyalgia   . GERD (gastroesophageal reflux disease)   . Headache(784.0)    sinus  . History of C-section    x2  . Hypertension   . OSA (obstructive sleep apnea)     Past Surgical History:  Procedure Laterality Date  . CESAREAN SECTION     x2  . CHOLECYSTECTOMY    . CHONDROPLASTY Left 09/29/2015   Procedure: CHONDROPLASTY;  Surgeon: Frederik Pear, MD;  Location: [facility];  Service: Orthopedics;  Laterality: Left;  . HYSTEROSCOPY W/D&C  01/14/2012   Procedure: DILATATION AND CURETTAGE /HYSTEROSCOPY;  Surgeon: Delice Lesch, MD;  Location: [facility];  Service: Gynecology;  Laterality: N/A;  . KNEE ARTHROSCOPY WITH MEDIAL MENISECTOMY Left 09/29/2015   Procedure: KNEE ARTHROSCOPY WITH MEDIAL MENISECTOMY AND CHONDROPLASTY;  Surgeon: Frederik Pear, MD;  Location: [facility];  Service: Orthopedics;  Laterality: Left;  . vulvar cyst      There were no vitals filed for this visit.  Subjective Assessment - 07/24/18 1108    Subjective  Was really sore after evaluation,  it lasted for about a day and then returned to baseline.  Has been doing her stretches.    Patient is accompained by:  Family member   [employer] activities;Walking    How long can you sit comfortably?  as long as needed    How long can you stand comfortably?  8 mins    How long can you walk comfortably?  approx 100' before having sharp pain and feels like back "locks up."     Patient Stated Goals  "Get rid of pain, get back to doing things you want to do."     Currently in Pain?  Yes    Pain Score  5     Pain Location  Back    Pain Orientation  Left;Lower    Pain Onset  More than a month ago         [facility] PT Assessment - 07/24/18 1116      ROM / Strength   AROM / PROM / Strength  PROM      PROM   Overall PROM   Deficits    PROM Assessment Site  Hip    Right/Left Hip  Right;Left      Flexibility   Hamstrings  R: 90/90 lacks 10 deg to  full knee extension - pain radiates to heel.  LLE:90/90 position pt able to reach full knee extension    ITB  RLE: not + for TFL tightness but hip extension creates pain in low back.  LLE: unable to perform Ober's test due to pain in low back.      Palpation   Spinal mobility  With tape measure.  18" neutral.  Flexion increased to 21".  Extension decreased to 17.5"    SI assessment   significant pain with compression and gapping      Standardized Balance Assessment   Standardized Balance Assessment  Five Times Sit to Stand;10 meter walk test    Five times sit to stand comments   17.75 seconds from mat, no UE support but WIDE BOS    10 Meter Walk  15.09 seconds or 2.17 ft/sec                           PT Education - 07/24/18 1229    Education Details  clinical findings, added to neural stretch to HEP, TDN    Person(s) Educated  Patient    Methods  Explanation;Demonstration;Handout    Comprehension  Verbalized understanding;Returned demonstration      Access Code: [email or web address]  URL:  [email or web address]  Date: 07/24/2018  Prepared by: Misty Stanley   Exercises  Single Knee to Chest Stretch - 3 reps - 1 sets - 20 hold - 3x daily - 7x weekly  Supine Piriformis Stretch with Leg Straight - 3 reps - 1 sets - 20 hold - 3x daily - 7x weekly  Seated Hamstring Stretch (BKA) - 3 reps - 1 sets - 20 hold - 3x daily - 7x weekly  Seated Slump Neural Tensioner - 5 reps - 2 sets - 2-3 seconds hold - 1x daily - 7x weekly    PT Short Term Goals - 07/24/18 1241      PT SHORT TERM GOAL #1   Title  Pt/husband will be independent with initial HEP in order to indicate improved flexibility and functional mobility.  (Target Date: 08/17/18)    Time  4    Period  Weeks    Status  New    Target Date  08/17/18      PT SHORT TERM GOAL #2   Title  Pt will improve functional gait velocity to >/= 2.62 ft/sec without AD    Baseline  2.17 ft/sec    Time  4    Period  Weeks    Status  Revised    Target Date  08/17/18      PT SHORT TERM GOAL #3   Title  Pt will decrease five time sit to stand score to </=15 seconds with more normal BOS    Baseline  17.75 seconds with really wide BOS    Time  4    Period  Weeks    Status  Revised    Target Date  08/17/18      PT SHORT TERM GOAL #4   Title  Will assess 6MWT with rollator and improve distance by 75' from baseline in order to indicate improved functional endurance.      Time  4    Period  Weeks    Status  On-going    Target Date  08/17/18      PT SHORT TERM GOAL #5   Title  Pt will demonstrate improved sleeping position in order to indicate decreased pain at night.  Time  4    Period  Weeks    Status  On-going    Target Date  08/17/18      PT SHORT TERM GOAL #6   Title  Pt will report no more than 7/10 pain with functional mobility in order to indicate improved function and flexibility.     Time  4    Period  Weeks    Status  New        PT Long Term Goals - 07/24/18 1244      PT LONG TERM GOAL #1   Title   Pt/husband will be independent with final HEP in order to indicate improved flexibility and improved functional mobility.  (Target Date: 09/16/18)    Time  8    Period  Weeks    Status  New    Target Date  09/16/18      PT LONG TERM GOAL #2   Title  Pt will improve 6MWT distance by 150' from baseline with use of rollator in order to indicate improved functional endurance.     Time  8    Period  Weeks    Status  New    Target Date  09/16/18      PT LONG TERM GOAL #3   Title  Pt will report no more than 5/10 pain with functional mobility in order to indicate improved flexibility and function.     Time  8    Period  Weeks    Status  New    Target Date  09/16/18      PT LONG TERM GOAL #4   Title  Pt will improve gait velocity without AD to >/= 3.0 ft/sec    Time  8    Period  Weeks    Status  New    Target Date  09/16/18      PT LONG TERM GOAL #5   Title  Pt will decrease five time sit to stand without UE use and normal BOS to </= 13 seconds    Time  8    Period  Weeks    Status  New    Target Date  09/16/18      Additional Long Term Goals   Additional Long Term Goals  Yes      PT LONG TERM GOAL #6   Title  Pt will increase trunk flexion by 3-4" and trunk extension by 1-2"    Baseline  with tape measure: flexion 18" >21"; extension 18" > 17.5"    Time  8    Period  Weeks    Status  New    Target Date  09/16/18            Plan - 07/24/18 1230    Clinical Impression Statement  Performed further assessment of lumbar and functional ROM, LE ROM and mm tension, functional LE strength, gait velocity and dysfunctional patterns of movement.   With lumbar spine stabilized pt does have full hamstring ROM but does report radicular pain; pt unable to tolerate LE position for TFL/IT band tightness and demonstrates decreased functional ROM for lumbar flexion and extension reporting pain with both.  Pt also demonstrates decreased functional LE strength and dysfunctional pattern for  sit <> stand and decreased gait velocity due to pain and decreased ROM.  Educated pt on dry needling purpose and method and provided pt with LE neural stretches in sitting.  Pt reported slight increase in pain by end of session and  antalgic gait.  Will continue to progress as pt is able to tolerate.    Rehab Potential  Good    Clinical Impairments Affecting Rehab Potential  chronicity of deficits    PT Frequency  2x / week    PT Duration  8 weeks    PT Treatment/Interventions  ADLs/Self Care Home Management;Aquatic Therapy;Moist Heat;Ultrasound;DME Instruction;Gait training;Stair training;Functional mobility training;Therapeutic activities;Therapeutic exercise;Balance training;Neuromuscular re-education;Patient/family education;Manual techniques;Passive range of motion;Dry needling;Energy conservation    PT Next Visit Plan  TDN for: low back bilat, piriformis bilat, L quad.  RE-educate use of core and pelvic stability, use of glutes for sit <> stand.  6MWT (she may do better with rollator), hip strengthening    PT Home Exercise Plan  [email or web address]        Patient will benefit from skilled therapeutic intervention in order to improve the following deficits and impairments:  Abnormal gait, Decreased activity tolerance, Decreased balance, Decreased endurance, Decreased knowledge of use of DME, Decreased mobility, Decreased range of motion, Decreased strength, Difficulty walking, Hypomobility, Impaired perceived functional ability, Impaired flexibility, Postural dysfunction, Pain, Impaired sensation  Visit Diagnosis: Chronic bilateral low back pain, unspecified whether sciatica present  Muscle weakness (generalized)  Other abnormalities of gait and mobility  Abnormal posture     Problem List Patient Active Problem List   Diagnosis Date Noted  . Post menopausal problems 01/08/2012    Class: Temporary  . Diabetes mellitus 01/08/2012    Class: Chronic  . Hypertension 01/08/2012    Class: Chronic   . Anxiety 01/08/2012    Class: Chronic  . Sleep apnea 01/08/2012    Class: Chronic  . Fibromyalgia syndrome 01/08/2012    Class: Chronic  . IBS (irritable bowel syndrome) 01/08/2012    Class: Chronic  . GERD (gastroesophageal reflux disease) 01/08/2012    Class: Chronic    Rico Junker, PT, DPT 07/24/18    12:51 PM    [facility] [address] [place], [place], [address] Phone: [phone]   Fax:  [phone]  Name: Marylouise Mallet MRN: [id number] Date of Birth: [birth year]

## 2018-07-29 ENCOUNTER — Ambulatory Visit: Payer: BLUE CROSS/BLUE SHIELD | Admitting: Physical Therapy

## 2018-07-29 ENCOUNTER — Encounter: Payer: Self-pay | Admitting: Physical Therapy

## 2018-07-29 DIAGNOSIS — G8929 Other chronic pain: Secondary | ICD-10-CM | POA: Diagnosis not present

## 2018-07-29 DIAGNOSIS — R293 Abnormal posture: Secondary | ICD-10-CM | POA: Diagnosis not present

## 2018-07-29 DIAGNOSIS — R2689 Other abnormalities of gait and mobility: Secondary | ICD-10-CM | POA: Diagnosis not present

## 2018-07-29 DIAGNOSIS — M545 Low back pain, unspecified: Secondary | ICD-10-CM

## 2018-07-29 DIAGNOSIS — M6281 Muscle weakness (generalized): Secondary | ICD-10-CM | POA: Diagnosis not present

## 2018-07-29 NOTE — Patient Instructions (Signed)
Bridge    Lie back, legs bent. Inhale, pressing hips up. Keeping ribs in, lengthen lower back. Exhale, rolling down along spine from top. Repeat _10-15___ times. Do __2__ sessions per day.  Hip Abduction / Adduction: with Knee Flexion (Supine)    With right knee bent, gently lower knee to side and return. Repeat __15__ times per set. Do __2__ sets per session.

## 2018-07-29 NOTE — Therapy (Signed)
[facility] [address] [place], [place], [address] Phone: [phone]   Fax:  [phone]  Physical Therapy Treatment  Patient Details  Name: Holly Daniels MRN: [id number] Date of Birth: [birth year] Referring Provider (PT): Latanya Presser, MD   Encounter Date: 07/29/2018  PT End of Session - 07/29/18 1622    Visit Number  3    Number of Visits  17    Date for PT Re-Evaluation  10/16/18    Authorization Type  BCBS    Authorization - Visit Number  3    Authorization - Number of Visits  3    PT Start Time  1532    PT Stop Time  1615    PT Time Calculation (min)  43 min    Activity Tolerance  Patient tolerated treatment well    Behavior During Therapy  [facility] for tasks assessed/performed       Past Medical History:  Diagnosis Date  . Anxiety   . Diabetes mellitus    oral meds  . Fibromyalgia   . GERD (gastroesophageal reflux disease)   . Headache(784.0)    sinus  . History of C-section    x2  . Hypertension   . OSA (obstructive sleep apnea)     Past Surgical History:  Procedure Laterality Date  . CESAREAN SECTION     x2  . CHOLECYSTECTOMY    . CHONDROPLASTY Left 09/29/2015   Procedure: CHONDROPLASTY;  Surgeon: Frederik Pear, MD;  Location: [facility];  Service: Orthopedics;  Laterality: Left;  . HYSTEROSCOPY W/D&C  01/14/2012   Procedure: DILATATION AND CURETTAGE /HYSTEROSCOPY;  Surgeon: Delice Lesch, MD;  Location: [facility];  Service: Gynecology;  Laterality: N/A;  . KNEE ARTHROSCOPY WITH MEDIAL MENISECTOMY Left 09/29/2015   Procedure: KNEE ARTHROSCOPY WITH MEDIAL MENISECTOMY AND CHONDROPLASTY;  Surgeon: Frederik Pear, MD;  Location: [facility];  Service: Orthopedics;  Laterality: Left;  . vulvar cyst      There were no vitals filed for this visit.  Subjective Assessment - 07/29/18 1616    Subjective  "Today is bad day" - has most pain with walking, standing, supine    Patient Stated Goals  "Get rid of pain, get back to doing things you want to do."     Currently in Pain?  Yes    Pain Score  4     Pain Location  Back    Pain Orientation  Right;Left;Lower    Pain Descriptors / Indicators  Aching;Burning;Shooting    Pain Type  Chronic pain                       [facility] Adult PT Treatment/Exercise - 07/29/18 0001      Exercises   Exercises  Knee/Hip      Knee/Hip Exercises: Supine   Bridges  Strengthening;Both;15 reps    Other Supine Knee/Hip Exercises  supine R hip fallout - x 15 reps focusing on control      Manual Therapy   Manual Therapy  Soft tissue mobilization;Myofascial release    Manual therapy comments  patient prone on elbows; education on self massage with ball against wall    Soft tissue mobilization  STM to R buttock with deep pressure to target trigger points    Myofascial Release  manual trigger point release to R piriformis and glute musculature       Trigger Point Dry Needling - 07/29/18 1605    Consent Given?  Yes    Education Handout Provided  Yes    Muscles Treated Lower Body  Piriformis    Piriformis Response  Twitch response elicited;Palpable increased muscle length   R side only          PT Education - 07/29/18 1622    Education Details  education on justification of DN treatment, HEP progression, need for increased mobility    Person(s) Educated  Patient    Methods  Explanation;Demonstration;Handout    Comprehension  Verbalized understanding       PT Short Term Goals - 07/24/18 1241      PT SHORT TERM GOAL #1   Title  Pt/husband will be independent with initial HEP in order to indicate improved flexibility and functional mobility.  (Target Date: 08/17/18)    Time  4    Period  Weeks    Status  New    Target Date  08/17/18      PT SHORT TERM GOAL #2   Title  Pt will improve functional gait velocity to >/= 2.62 ft/sec without AD    Baseline  2.17 ft/sec    Time  4    Period  Weeks     Status  Revised    Target Date  08/17/18      PT SHORT TERM GOAL #3   Title  Pt will decrease five time sit to stand score to </=15 seconds with more normal BOS    Baseline  17.75 seconds with really wide BOS    Time  4    Period  Weeks    Status  Revised    Target Date  08/17/18      PT SHORT TERM GOAL #4   Title  Will assess 6MWT with rollator and improve distance by 75' from baseline in order to indicate improved functional endurance.      Time  4    Period  Weeks    Status  On-going    Target Date  08/17/18      PT SHORT TERM GOAL #5   Title  Pt will demonstrate improved sleeping position in order to indicate decreased pain at night.     Time  4    Period  Weeks    Status  On-going    Target Date  08/17/18      PT SHORT TERM GOAL #6   Title  Pt will report no more than 7/10 pain with functional mobility in order to indicate improved function and flexibility.     Time  4    Period  Weeks    Status  New        PT Long Term Goals - 07/24/18 1244      PT LONG TERM GOAL #1   Title  Pt/husband will be independent with final HEP in order to indicate improved flexibility and improved functional mobility.  (Target Date: 09/16/18)    Time  8    Period  Weeks    Status  New    Target Date  09/16/18      PT LONG TERM GOAL #2   Title  Pt will improve 6MWT distance by 150' from baseline with use of rollator in order to indicate improved functional endurance.     Time  8    Period  Weeks    Status  New    Target Date  09/16/18      PT LONG TERM GOAL #3   Title  Pt will  report no more than 5/10 pain with functional mobility in order to indicate improved flexibility and function.     Time  8    Period  Weeks    Status  New    Target Date  09/16/18      PT LONG TERM GOAL #4   Title  Pt will improve gait velocity without AD to >/= 3.0 ft/sec    Time  8    Period  Weeks    Status  New    Target Date  09/16/18      PT LONG TERM GOAL #5   Title  Pt will decrease five  time sit to stand without UE use and normal BOS to </= 13 seconds    Time  8    Period  Weeks    Status  New    Target Date  09/16/18      Additional Long Term Goals   Additional Long Term Goals  Yes      PT LONG TERM GOAL #6   Title  Pt will increase trunk flexion by 3-4" and trunk extension by 1-2"    Baseline  with tape measure: flexion 18" >21"; extension 18" > 17.5"    Time  8    Period  Weeks    Status  New    Target Date  09/16/18            Plan - 07/29/18 1623    Clinical Impression Statement  Session largely focusing on manual therapy and patient education on TDN treatment and expectations. Patient reporitng limited mobility at home due to pain. Lengthy discussion on need to increase activity level with PT advising to start at 5 min daily walking program and gradually increasing as she tolerates. Manual to R buttock today along with education on self massage with ball against wall. PRogresison of HEP today with good tolerance and carryover.     Rehab Potential  Good    Clinical Impairments Affecting Rehab Potential  chronicity of deficits    PT Frequency  2x / week    PT Duration  8 weeks    PT Treatment/Interventions  ADLs/Self Care Home Management;Aquatic Therapy;Moist Heat;Ultrasound;DME Instruction;Gait training;Stair training;Functional mobility training;Therapeutic activities;Therapeutic exercise;Balance training;Neuromuscular re-education;Patient/family education;Manual techniques;Passive range of motion;Dry needling;Energy conservation    PT Next Visit Plan  TDN for: low back bilat, piriformis bilat, L quad.  RE-educate use of core and pelvic stability, use of glutes for sit <> stand.  6MWT (she may do better with rollator), hip strengthening    PT Home Exercise Plan  [email or web address]        Patient will benefit from skilled therapeutic intervention in order to improve the following deficits and impairments:  Abnormal gait, Decreased activity tolerance, Decreased  balance, Decreased endurance, Decreased knowledge of use of DME, Decreased mobility, Decreased range of motion, Decreased strength, Difficulty walking, Hypomobility, Impaired perceived functional ability, Impaired flexibility, Postural dysfunction, Pain, Impaired sensation  Visit Diagnosis: Chronic bilateral low back pain, unspecified whether sciatica present  Muscle weakness (generalized)  Other abnormalities of gait and mobility  Abnormal posture     Problem List Patient Active Problem List   Diagnosis Date Noted  . Post menopausal problems 01/08/2012    Class: Temporary  . Diabetes mellitus 01/08/2012    Class: Chronic  . Hypertension 01/08/2012    Class: Chronic  . Anxiety 01/08/2012    Class: Chronic  . Sleep apnea 01/08/2012    Class: Chronic  . Fibromyalgia syndrome  01/08/2012    Class: Chronic  . IBS (irritable bowel syndrome) 01/08/2012    Class: Chronic  . GERD (gastroesophageal reflux disease) 01/08/2012    Class: Chronic     Lanney Gins, PT, DPT Supplemental Physical Therapist 07/29/18 4:29 PM Pager: [phone] Office: [facility] [address] [place], [place], [address] Phone: [phone]   Fax:  [phone]  Name: Holly Daniels MRN: [id number] Date of Birth: August 06, 1956

## 2018-08-01 ENCOUNTER — Ambulatory Visit: Payer: BLUE CROSS/BLUE SHIELD | Attending: Cardiology | Admitting: Rehabilitation

## 2018-08-01 ENCOUNTER — Encounter: Payer: Self-pay | Admitting: Rehabilitation

## 2018-08-01 DIAGNOSIS — R2689 Other abnormalities of gait and mobility: Secondary | ICD-10-CM | POA: Insufficient documentation

## 2018-08-01 DIAGNOSIS — R42 Dizziness and giddiness: Secondary | ICD-10-CM | POA: Diagnosis not present

## 2018-08-01 DIAGNOSIS — R293 Abnormal posture: Secondary | ICD-10-CM | POA: Diagnosis not present

## 2018-08-01 DIAGNOSIS — M545 Low back pain: Secondary | ICD-10-CM | POA: Insufficient documentation

## 2018-08-01 DIAGNOSIS — G8929 Other chronic pain: Secondary | ICD-10-CM | POA: Diagnosis not present

## 2018-08-01 DIAGNOSIS — M6281 Muscle weakness (generalized): Secondary | ICD-10-CM

## 2018-08-01 NOTE — Therapy (Signed)
[facility] [address] [place], [place], [address] Phone: [phone]   Fax:  [phone]  Physical Therapy Treatment  Patient Details  Name: Holly Daniels MRN: [id number] Date of Birth: 1956/01/20 Referring Provider (PT): Latanya Presser, MD   Encounter Date: 08/01/2018  PT End of Session - 08/01/18 1027    Visit Number  4    Number of Visits  17    Date for PT Re-Evaluation  10/16/18    Authorization Type  BCBS    Authorization - Visit Number  4    Authorization - Number of Visits  90    PT Start Time  1017    PT Stop Time  1100    PT Time Calculation (min)  43 min    Activity Tolerance  Patient tolerated treatment well    Behavior During Therapy  [facility] for tasks assessed/performed       Past Medical History:  Diagnosis Date  . Anxiety   . Diabetes mellitus    oral meds  . Fibromyalgia   . GERD (gastroesophageal reflux disease)   . Headache(784.0)    sinus  . History of C-section    x2  . Hypertension   . OSA (obstructive sleep apnea)     Past Surgical History:  Procedure Laterality Date  . CESAREAN SECTION     x2  . CHOLECYSTECTOMY    . CHONDROPLASTY Left 09/29/2015   Procedure: CHONDROPLASTY;  Surgeon: Frederik Pear, MD;  Location: [place];  Service: Orthopedics;  Laterality: Left;  . HYSTEROSCOPY W/D&C  01/14/2012   Procedure: DILATATION AND CURETTAGE /HYSTEROSCOPY;  Surgeon: Delice Lesch, MD;  Location: [facility];  Service: Gynecology;  Laterality: N/A;  . KNEE ARTHROSCOPY WITH MEDIAL MENISECTOMY Left 09/29/2015   Procedure: KNEE ARTHROSCOPY WITH MEDIAL MENISECTOMY AND CHONDROPLASTY;  Surgeon: Frederik Pear, MD;  Location: [place];  Service: Orthopedics;  Laterality: Left;  . vulvar cyst      There were no vitals filed for this visit.  Subjective Assessment - 08/01/18 1023    Subjective  Today is a good day.  I was sore the day after the needling, but then felt  better."     Limitations  House hold activities;Walking    How long can you sit comfortably?  as long as needed    How long can you stand comfortably?  8 mins    How long can you walk comfortably?  approx 100' before having sharp pain and feels like back "locks up."     Patient Stated Goals  "Get rid of pain, get back to doing things you want to do."     Currently in Pain?  Yes    Pain Score  5     Pain Location  Back    Pain Orientation  Lower    Pain Descriptors / Indicators  Aching;Burning;Shooting    Pain Type  Chronic pain    Pain Onset  More than a month ago    Aggravating Factors   standing, walking, driving    Pain Relieving Factors  pain medication, husband rubs back, stretching         [facility] PT Assessment - 08/01/18 1033      Ambulation/Gait   Ambulation/Gait  Yes    Ambulation/Gait Assistance  6: Modified independent (Device/Increase time);5: Supervision    Ambulation/Gait Assistance Details  Initiated gait training with use of rollator during session.  Education on how to utilize brakes to  turn and sit when needed and also safety when sitting/standing.  Pt did very well and felt like this would help her at home.  PT to seek order from MD.     Ambulation Distance (Feet)  115 Feet    Assistive device  4-wheeled walker    Gait Pattern  Step-through pattern;Decreased stride length;Trendelenburg;Lateral hip instability;Wide base of support    Ambulation Surface  Level;Indoor      6 Minute Walk- Baseline   6 Minute Walk- Baseline  yes    BP (mmHg)  134/86    HR (bpm)  90    02 Sat (%RA)  96 %    Modified Borg Scale for Dyspnea  3- Moderate shortness of breath or breathing difficulty    Perceived Rate of Exertion (Borg)  9- very light      6 Minute walk- Post Test   6 Minute Walk Post Test  yes    BP (mmHg)  142/88    HR (bpm)  101    02 Sat (%RA)  98 %    Modified Borg Scale for Dyspnea  6-    Perceived Rate of Exertion (Borg)  13- Somewhat hard      6 minute walk  test results    Aerobic Endurance Distance Walked  400    Endurance additional comments  with rollator and 2 seated rest breaks (one for 45 secs, one for 1 min 15 secs)                   [facility] Adult PT Treatment/Exercise - 08/01/18 1033      Exercises   Exercises  Other Exercises    Other Exercises   SL clam exercise x 10 reps on each side with cues for proper technique and only elevating in pain free range.  Hooklying isometric hip abd with seat belt x 10 reps, hooklying posterior pelvic tilt x 10 reps.  Added to HEP, see pt instruction.              PT Education - 08/01/18 1254    Education Details  Continue to provide max education on importance of moving more at home, getting order for rollator to improve ability to ambulate at home and in community.     Person(s) Educated  Patient    Methods  Explanation;Demonstration    Comprehension  Verbalized understanding;Returned demonstration       PT Short Term Goals - 07/24/18 1241      PT SHORT TERM GOAL #1   Title  Pt/husband will be independent with initial HEP in order to indicate improved flexibility and functional mobility.  (Target Date: 08/17/18)    Time  4    Period  Weeks    Status  New    Target Date  08/17/18      PT SHORT TERM GOAL #2   Title  Pt will improve functional gait velocity to >/= 2.62 ft/sec without AD    Baseline  2.17 ft/sec    Time  4    Period  Weeks    Status  Revised    Target Date  08/17/18      PT SHORT TERM GOAL #3   Title  Pt will decrease five time sit to stand score to </=15 seconds with more normal BOS    Baseline  17.75 seconds with really wide BOS    Time  4    Period  Weeks    Status  Revised  Target Date  08/17/18      PT SHORT TERM GOAL #4   Title  Will assess 6MWT with rollator and improve distance by 75' from baseline in order to indicate improved functional endurance.      Time  4    Period  Weeks    Status  On-going    Target Date  08/17/18      PT  SHORT TERM GOAL #5   Title  Pt will demonstrate improved sleeping position in order to indicate decreased pain at night.     Time  4    Period  Weeks    Status  On-going    Target Date  08/17/18      PT SHORT TERM GOAL #6   Title  Pt will report no more than 7/10 pain with functional mobility in order to indicate improved function and flexibility.     Time  4    Period  Weeks    Status  New        PT Long Term Goals - 07/24/18 1244      PT LONG TERM GOAL #1   Title  Pt/husband will be independent with final HEP in order to indicate improved flexibility and improved functional mobility.  (Target Date: 09/16/18)    Time  8    Period  Weeks    Status  New    Target Date  09/16/18      PT LONG TERM GOAL #2   Title  Pt will improve 6MWT distance by 150' from baseline with use of rollator in order to indicate improved functional endurance.     Time  8    Period  Weeks    Status  New    Target Date  09/16/18      PT LONG TERM GOAL #3   Title  Pt will report no more than 5/10 pain with functional mobility in order to indicate improved flexibility and function.     Time  8    Period  Weeks    Status  New    Target Date  09/16/18      PT LONG TERM GOAL #4   Title  Pt will improve gait velocity without AD to >/= 3.0 ft/sec    Time  8    Period  Weeks    Status  New    Target Date  09/16/18      PT LONG TERM GOAL #5   Title  Pt will decrease five time sit to stand without UE use and normal BOS to </= 13 seconds    Time  8    Period  Weeks    Status  New    Target Date  09/16/18      Additional Long Term Goals   Additional Long Term Goals  Yes      PT LONG TERM GOAL #6   Title  Pt will increase trunk flexion by 3-4" and trunk extension by 1-2"    Baseline  with tape measure: flexion 18" >21"; extension 18" > 17.5"    Time  8    Period  Weeks    Status  New    Target Date  09/16/18            Plan - 08/01/18 1254    Clinical Impression Statement  Skilled  session focused on formal assessment of functional endurance with performance of 6MWT in which pt completed 400' with two seated rest breaks  for total of about 2 mins.  Note that PT had her use rollator as I feel that this will help her work towards improving endurance and independence with household and community mobility.  Will work to get order from MD.     Rehab Potential  Good    Clinical Impairments Affecting Rehab Potential  chronicity of deficits    PT Frequency  2x / week    PT Duration  8 weeks    PT Treatment/Interventions  ADLs/Self Care Home Management;Aquatic Therapy;Moist Heat;Ultrasound;DME Instruction;Gait training;Stair training;Functional mobility training;Therapeutic activities;Therapeutic exercise;Balance training;Neuromuscular re-education;Patient/family education;Manual techniques;Passive range of motion;Dry needling;Energy conservation    PT Next Visit Plan  TDN for: low back bilat, piriformis bilat, L quad.  RE-educate use of core and pelvic stability, use of glutes for sit <> stand.  hip strengthening    PT Home Exercise Plan  [email or web address]        Patient will benefit from skilled therapeutic intervention in order to improve the following deficits and impairments:  Abnormal gait, Decreased activity tolerance, Decreased balance, Decreased endurance, Decreased knowledge of use of DME, Decreased mobility, Decreased range of motion, Decreased strength, Difficulty walking, Hypomobility, Impaired perceived functional ability, Impaired flexibility, Postural dysfunction, Pain, Impaired sensation  Visit Diagnosis: Chronic bilateral low back pain, unspecified whether sciatica present  Muscle weakness (generalized)  Other abnormalities of gait and mobility  Abnormal posture     Problem List Patient Active Problem List   Diagnosis Date Noted  . Post menopausal problems 01/08/2012    Class: Temporary  . Diabetes mellitus 01/08/2012    Class: Chronic  . Hypertension 01/08/2012     Class: Chronic  . Anxiety 01/08/2012    Class: Chronic  . Sleep apnea 01/08/2012    Class: Chronic  . Fibromyalgia syndrome 01/08/2012    Class: Chronic  . IBS (irritable bowel syndrome) 01/08/2012    Class: Chronic  . GERD (gastroesophageal reflux disease) 01/08/2012    Class: Chronic    Cameron Sprang, PT, MPT [facility] [address] [place], [place], [address] Phone: [phone]   Fax:  [phone] 08/01/18, 1:03 PM  Name: Holly Daniels MRN: [id number] Date of Birth: June 15, 1956

## 2018-08-01 NOTE — Patient Instructions (Signed)
Access Code: [email or web address]  URL: [email or web address]  Date: 08/01/2018  Prepared by: Cameron Sprang   Exercises  Single Knee to Chest Stretch - 3 reps - 1 sets - 20 hold - 3x daily - 7x weekly  Supine Piriformis Stretch with Leg Straight - 3 reps - 1 sets - 20 hold - 3x daily - 7x weekly  Seated Hamstring Stretch (BKA) - 3 reps - 1 sets - 20 hold - 3x daily - 7x weekly  Seated Slump Neural Tensioner - 5 reps - 2 sets - 2-3 seconds hold - 1x daily - 7x weekly  Hooklying Isometric Hip Abduction with Belt - 10 reps - 1 sets - 5 hold - 1x daily - 7x weekly  Clamshell - 10 reps - 1 sets - 1x daily - 7x weekly

## 2018-08-05 ENCOUNTER — Ambulatory Visit: Payer: BLUE CROSS/BLUE SHIELD | Admitting: Rehabilitation

## 2018-08-05 ENCOUNTER — Encounter: Payer: Self-pay | Admitting: Rehabilitation

## 2018-08-05 DIAGNOSIS — K219 Gastro-esophageal reflux disease without esophagitis: Secondary | ICD-10-CM | POA: Diagnosis not present

## 2018-08-05 DIAGNOSIS — R2689 Other abnormalities of gait and mobility: Secondary | ICD-10-CM | POA: Diagnosis not present

## 2018-08-05 DIAGNOSIS — M545 Low back pain: Secondary | ICD-10-CM | POA: Diagnosis not present

## 2018-08-05 DIAGNOSIS — J00 Acute nasopharyngitis [common cold]: Secondary | ICD-10-CM | POA: Diagnosis not present

## 2018-08-05 DIAGNOSIS — E11649 Type 2 diabetes mellitus with hypoglycemia without coma: Secondary | ICD-10-CM | POA: Diagnosis not present

## 2018-08-05 DIAGNOSIS — R42 Dizziness and giddiness: Secondary | ICD-10-CM | POA: Diagnosis not present

## 2018-08-05 DIAGNOSIS — M6281 Muscle weakness (generalized): Secondary | ICD-10-CM

## 2018-08-05 DIAGNOSIS — R293 Abnormal posture: Secondary | ICD-10-CM

## 2018-08-05 DIAGNOSIS — G8929 Other chronic pain: Secondary | ICD-10-CM

## 2018-08-05 DIAGNOSIS — E1165 Type 2 diabetes mellitus with hyperglycemia: Secondary | ICD-10-CM | POA: Diagnosis not present

## 2018-08-05 NOTE — Therapy (Signed)
[facility] [address] [place], [place], [address] Phone: [phone]   Fax:  [phone]  Physical Therapy Treatment  Patient Details  Name: Holly Daniels MRN: [id number] Date of Birth: [birth year] Referring Provider (PT): Latanya Presser, MD   Encounter Date: 08/05/2018  PT End of Session - 08/05/18 1500    Visit Number  5    Number of Visits  17    Date for PT Re-Evaluation  10/16/18    Authorization Type  BCBS    Authorization - Visit Number  5    Authorization - Number of Visits  90    PT Start Time  0174    PT Stop Time  1245    PT Time Calculation (min)  50 min    Activity Tolerance  Patient tolerated treatment well    Behavior During Therapy  [facility] for tasks assessed/performed       Past Medical History:  Diagnosis Date  . Anxiety   . Diabetes mellitus    oral meds  . Fibromyalgia   . GERD (gastroesophageal reflux disease)   . Headache(784.0)    sinus  . History of C-section    x2  . Hypertension   . OSA (obstructive sleep apnea)     Past Surgical History:  Procedure Laterality Date  . CESAREAN SECTION     x2  . CHOLECYSTECTOMY    . CHONDROPLASTY Left 09/29/2015   Procedure: CHONDROPLASTY;  Surgeon: Frederik Pear, MD;  Location: [place];  Service: Orthopedics;  Laterality: Left;  . HYSTEROSCOPY W/D&C  01/14/2012   Procedure: DILATATION AND CURETTAGE /HYSTEROSCOPY;  Surgeon: Delice Lesch, MD;  Location: [facility];  Service: Gynecology;  Laterality: N/A;  . KNEE ARTHROSCOPY WITH MEDIAL MENISECTOMY Left 09/29/2015   Procedure: KNEE ARTHROSCOPY WITH MEDIAL MENISECTOMY AND CHONDROPLASTY;  Surgeon: Frederik Pear, MD;  Location: [facility];  Service: Orthopedics;  Laterality: Left;  . vulvar cyst      There were no vitals filed for this visit.  Subjective Assessment - 08/05/18 1158    Subjective  Pt reports today is NOT a good day and is in tons of pain.     Limitations  House hold activities;Walking    How long can you sit comfortably?  as long as needed    How long can you stand comfortably?  8 mins    How long can you walk comfortably?  approx 100' before having sharp pain and feels like back "locks up."     Patient Stated Goals  "Get rid of pain, get back to doing things you want to do."     Currently in Pain?  Yes    Pain Score  10-Worst pain ever    Pain Location  Back    Pain Orientation  Lower;Right;Left    Pain Descriptors / Indicators  Sore    Pain Type  Chronic pain    Pain Radiating Towards  down into backs of legs    Pain Onset  More than a month ago    Pain Frequency  Constant    Aggravating Factors   standing, walking, driving     Pain Relieving Factors  pain medication              Vestibular Assessment - 08/05/18 0001      Positional Testing   Dix-Hallpike  Dix-Hallpike Left    Horizontal Canal Testing  Horizontal Canal Right;Horizontal Canal Left      Dix-Hallpike  Left   Dix-Hallpike Left Duration  15 secs     Dix-Hallpike Left Symptoms  Upbeat, left rotatory nystagmus      Horizontal Canal Right   Horizontal Canal Right Duration  15 secs    Horizontal Canal Right Symptoms  Nystagmus   L upbeat rotary nystagmus              [facility] Adult PT Treatment/Exercise - 08/05/18 1155      Ambulation/Gait   Ambulation/Gait  Yes    Ambulation/Gait Assistance  6: Modified independent (Device/Increase time);5: Supervision    Ambulation/Gait Assistance Details  Pt ambulatory into clinic without device and very guarded and with antalgic gait pattern.  Pt reporting today is a bad day and has increased pain.     Ambulation Distance (Feet)  100 Feet    Assistive device  None    Gait Pattern  Step-through pattern;Decreased stride length;Trendelenburg;Lateral hip instability;Wide base of support    Ambulation Surface  Level;Indoor      Exercises   Other Exercises   Performed supine BLE stretching (on 3 pillows and  wedge under knees) to decrease pain.  Performed knee to chest x 1 rep on each side x 1 min.  Supine hamstring stretch x 1 rep on each side x 1 min.  Pt VERY guarded and has a VERY difficult time not assisting with stretching.  Also performed hooklying lower pelvic rotation x 10 reps with 5 sec hold in each direction.        Vestibular Treatment/Exercise - 08/05/18 0001      Vestibular Treatment/Exercise   Vestibular Treatment Provided  Canalith Repositioning    Canalith Repositioning  Epley Manuever Left       EPLEY MANUEVER LEFT   Number of Reps   3    Overall Response   Improved Symptoms     RESPONSE DETAILS LEFT  Pts reported symptoms decreased with each rep, however still had symptoms with first position (L dix hallpike) each time.             PT Education - 08/05/18 1202    Education Details  Education on getting order for rollator signed today at MD office so that she can get today if able.  Education on BPPV and Epley manuever.     Person(s) Educated  Patient    Methods  Explanation;Handout    Comprehension  Verbalized understanding       PT Short Term Goals - 07/24/18 1241      PT SHORT TERM GOAL #1   Title  Pt/husband will be independent with initial HEP in order to indicate improved flexibility and functional mobility.  (Target Date: 08/17/18)    Time  4    Period  Weeks    Status  New    Target Date  08/17/18      PT SHORT TERM GOAL #2   Title  Pt will improve functional gait velocity to >/= 2.62 ft/sec without AD    Baseline  2.17 ft/sec    Time  4    Period  Weeks    Status  Revised    Target Date  08/17/18      PT SHORT TERM GOAL #3   Title  Pt will decrease five time sit to stand score to </=15 seconds with more normal BOS    Baseline  17.75 seconds with really wide BOS    Time  4    Period  Weeks    Status  Revised    Target Date  08/17/18      PT SHORT TERM GOAL #4   Title  Will assess 6MWT with rollator and improve distance by 75' from baseline  in order to indicate improved functional endurance.      Time  4    Period  Weeks    Status  On-going    Target Date  08/17/18      PT SHORT TERM GOAL #5   Title  Pt will demonstrate improved sleeping position in order to indicate decreased pain at night.     Time  4    Period  Weeks    Status  On-going    Target Date  08/17/18      PT SHORT TERM GOAL #6   Title  Pt will report no more than 7/10 pain with functional mobility in order to indicate improved function and flexibility.     Time  4    Period  Weeks    Status  New        PT Long Term Goals - 07/24/18 1244      PT LONG TERM GOAL #1   Title  Pt/husband will be independent with final HEP in order to indicate improved flexibility and improved functional mobility.  (Target Date: 09/16/18)    Time  8    Period  Weeks    Status  New    Target Date  09/16/18      PT LONG TERM GOAL #2   Title  Pt will improve 6MWT distance by 150' from baseline with use of rollator in order to indicate improved functional endurance.     Time  8    Period  Weeks    Status  New    Target Date  09/16/18      PT LONG TERM GOAL #3   Title  Pt will report no more than 5/10 pain with functional mobility in order to indicate improved flexibility and function.     Time  8    Period  Weeks    Status  New    Target Date  09/16/18      PT LONG TERM GOAL #4   Title  Pt will improve gait velocity without AD to >/= 3.0 ft/sec    Time  8    Period  Weeks    Status  New    Target Date  09/16/18      PT LONG TERM GOAL #5   Title  Pt will decrease five time sit to stand without UE use and normal BOS to </= 13 seconds    Time  8    Period  Weeks    Status  New    Target Date  09/16/18      Additional Long Term Goals   Additional Long Term Goals  Yes      PT LONG TERM GOAL #6   Title  Pt will increase trunk flexion by 3-4" and trunk extension by 1-2"    Baseline  with tape measure: flexion 18" >21"; extension 18" > 17.5"    Time  8     Period  Weeks    Status  New    Target Date  09/16/18            Plan - 08/05/18 1501    Clinical Impression Statement  Skilled session focused on BLE flexibility to reduce pain in low back.  Upon elevating to sitting position, note  marked dizziness.  Pt reports she has dealt with this for 4 years but does have spinning sensation and also gets when she looks to the L when driving.  PT addressed during session with L dix hallpike with positive test for L posterior canalithiasis therefore performed L Epley maneuver x 3 reps with symptoms improved.  Will continue to address.     Rehab Potential  Good    Clinical Impairments Affecting Rehab Potential  chronicity of deficits    PT Frequency  2x / week    PT Duration  8 weeks    PT Treatment/Interventions  ADLs/Self Care Home Management;Aquatic Therapy;Moist Heat;Ultrasound;DME Instruction;Gait training;Stair training;Functional mobility training;Therapeutic activities;Therapeutic exercise;Balance training;Neuromuscular re-education;Patient/family education;Manual techniques;Passive range of motion;Dry needling;Energy conservation    PT Next Visit Plan  re-check for L dix hallpike, TDN for: low back bilat, piriformis bilat, L quad.  RE-educate use of core and pelvic stability, use of glutes for sit <> stand.  hip strengthening    PT Home Exercise Plan  [email or web address]        Patient will benefit from skilled therapeutic intervention in order to improve the following deficits and impairments:  Abnormal gait, Decreased activity tolerance, Decreased balance, Decreased endurance, Decreased knowledge of use of DME, Decreased mobility, Decreased range of motion, Decreased strength, Difficulty walking, Hypomobility, Impaired perceived functional ability, Impaired flexibility, Postural dysfunction, Pain, Impaired sensation  Visit Diagnosis: Chronic bilateral low back pain, unspecified whether sciatica present  Muscle weakness (generalized)  Other  abnormalities of gait and mobility  Abnormal posture  Dizziness and giddiness     Problem List Patient Active Problem List   Diagnosis Date Noted  . Post menopausal problems 01/08/2012    Class: Temporary  . Diabetes mellitus 01/08/2012    Class: Chronic  . Hypertension 01/08/2012    Class: Chronic  . Anxiety 01/08/2012    Class: Chronic  . Sleep apnea 01/08/2012    Class: Chronic  . Fibromyalgia syndrome 01/08/2012    Class: Chronic  . IBS (irritable bowel syndrome) 01/08/2012    Class: Chronic  . GERD (gastroesophageal reflux disease) 01/08/2012    Class: Chronic    Cameron Sprang, PT, MPT [facility] [address] [place], [place], [address] Phone: [phone]   Fax:  [phone] 08/05/18, 3:11 PM  Name: Holly Daniels MRN: [id number] Date of Birth: Nov 28, 1955

## 2018-08-06 DIAGNOSIS — R269 Unspecified abnormalities of gait and mobility: Secondary | ICD-10-CM | POA: Diagnosis not present

## 2018-08-12 ENCOUNTER — Other Ambulatory Visit: Payer: Self-pay | Admitting: Internal Medicine

## 2018-08-12 ENCOUNTER — Ambulatory Visit: Payer: BLUE CROSS/BLUE SHIELD | Admitting: Rehabilitation

## 2018-08-12 ENCOUNTER — Encounter: Payer: Self-pay | Admitting: Rehabilitation

## 2018-08-12 DIAGNOSIS — R293 Abnormal posture: Secondary | ICD-10-CM

## 2018-08-12 DIAGNOSIS — M545 Low back pain, unspecified: Secondary | ICD-10-CM

## 2018-08-12 DIAGNOSIS — R2689 Other abnormalities of gait and mobility: Secondary | ICD-10-CM

## 2018-08-12 DIAGNOSIS — M6281 Muscle weakness (generalized): Secondary | ICD-10-CM

## 2018-08-12 DIAGNOSIS — G8929 Other chronic pain: Secondary | ICD-10-CM | POA: Diagnosis not present

## 2018-08-12 DIAGNOSIS — R42 Dizziness and giddiness: Secondary | ICD-10-CM | POA: Diagnosis not present

## 2018-08-12 NOTE — Therapy (Signed)
[facility] [address] [place], [place], [address] Phone: [phone]   Fax:  [phone]  Physical Therapy Treatment  Patient Details  Name: Holly Daniels MRN: [id number] Date of Birth: [birth year] Referring Provider (PT): Latanya Presser, MD   Encounter Date: 08/12/2018  PT End of Session - 08/12/18 1204    Visit Number  6    Number of Visits  17    Date for PT Re-Evaluation  10/16/18    Authorization Type  BCBS    Authorization - Visit Number  6    Authorization - Number of Visits  90    PT Start Time  1200    PT Stop Time  1230    PT Time Calculation (min)  30 min    Activity Tolerance  Patient tolerated treatment well    Behavior During Therapy  [name] for tasks assessed/performed       Past Medical History:  Diagnosis Date  . Anxiety   . Diabetes mellitus    oral meds  . Fibromyalgia   . GERD (gastroesophageal reflux disease)   . Headache(784.0)    sinus  . History of C-section    x2  . Hypertension   . OSA (obstructive sleep apnea)     Past Surgical History:  Procedure Laterality Date  . CESAREAN SECTION     x2  . CHOLECYSTECTOMY    . CHONDROPLASTY Left 09/29/2015   Procedure: CHONDROPLASTY;  Surgeon: Frederik Pear, MD;  Location: [facility];  Service: Orthopedics;  Laterality: Left;  . HYSTEROSCOPY W/D&C  01/14/2012   Procedure: DILATATION AND CURETTAGE /HYSTEROSCOPY;  Surgeon: Delice Lesch, MD;  Location: [facility];  Service: Gynecology;  Laterality: N/A;  . KNEE ARTHROSCOPY WITH MEDIAL MENISECTOMY Left 09/29/2015   Procedure: KNEE ARTHROSCOPY WITH MEDIAL MENISECTOMY AND CHONDROPLASTY;  Surgeon: Frederik Pear, MD;  Location: [facility];  Service: Orthopedics;  Laterality: Left;  . vulvar cyst      There were no vitals filed for this visit.  Subjective Assessment - 08/12/18 1202    Subjective  Pt reports pain is terrible today, but has had no more vertigo.      Limitations  House hold activities;Walking    How long can you sit comfortably?  as long as needed    How long can you stand comfortably?  8 mins    How long can you walk comfortably?  approx 100' before having sharp pain and feels like back "locks up."     Patient Stated Goals  "Get rid of pain, get back to doing things you want to do."     Currently in Pain?  Yes    Pain Score  10-Worst pain ever    Pain Location  Back    Pain Orientation  Right;Left;Lower    Pain Type  Chronic pain    Pain Onset  More than a month ago    Pain Frequency  Constant    Aggravating Factors   standing, walking, driving, weather    Pain Relieving Factors  pain medication                        [facility] Adult PT Treatment/Exercise - 08/12/18 1200      Ambulation/Gait   Ambulation/Gait  Yes    Ambulation/Gait Assistance  6: Modified independent (Device/Increase time);5: Supervision    Ambulation/Gait Assistance Details  S to mod I with use of rollator during session.  Adjusted arm height to appropriate height and note marked improvement in gait speed and endurance.  Min cues for closeness to RW and posture with better engagement of core musculature.      Ambulation Distance (Feet)  345 Feet    Assistive device  4-wheeled walker    Gait Pattern  Step-through pattern;Decreased stride length;Trendelenburg;Lateral hip instability;Wide base of support    Ambulation Surface  Level;Indoor      Exercises   Other Exercises   Tall kneeling moving red weighted ball in diagonal patterns x 10 directions with rest break needed at 5 rep point.  Cues for decreasing anterior pelvic tilt and to maintain arms in extended position to further engage core.  Progressed to half kneeling with arms extended holding green theraband handles with trunk rotation x 10 reps each direction (R half kneel with L rotation and vice versa), quadruped alternating LE extension x 5 reps (2 sets) progressing to quadruped alternating UE/LE  extension x sets of 5 reps.  Pt moves in/out of positions very well today.  Also performed child's pose position for low back stretch x 45 secs.               PT Education - 08/12/18 1251    Education Details  education on initiating pool PT in Dec for improved functional mobility.     Person(s) Educated  Patient    Methods  Explanation    Comprehension  Verbalized understanding       PT Short Term Goals - 07/24/18 1241      PT SHORT TERM GOAL #1   Title  Pt/husband will be independent with initial HEP in order to indicate improved flexibility and functional mobility.  (Target Date: 08/17/18)    Time  4    Period  Weeks    Status  New    Target Date  08/17/18      PT SHORT TERM GOAL #2   Title  Pt will improve functional gait velocity to >/= 2.62 ft/sec without AD    Baseline  2.17 ft/sec    Time  4    Period  Weeks    Status  Revised    Target Date  08/17/18      PT SHORT TERM GOAL #3   Title  Pt will decrease five time sit to stand score to </=15 seconds with more normal BOS    Baseline  17.75 seconds with really wide BOS    Time  4    Period  Weeks    Status  Revised    Target Date  08/17/18      PT SHORT TERM GOAL #4   Title  Will assess 6MWT with rollator and improve distance by 75' from baseline in order to indicate improved functional endurance.      Time  4    Period  Weeks    Status  On-going    Target Date  08/17/18      PT SHORT TERM GOAL #5   Title  Pt will demonstrate improved sleeping position in order to indicate decreased pain at night.     Time  4    Period  Weeks    Status  On-going    Target Date  08/17/18      PT SHORT TERM GOAL #6   Title  Pt will report no more than 7/10 pain with functional mobility in order to indicate improved function and flexibility.     Time  4  Period  Weeks    Status  New        PT Long Term Goals - 07/24/18 1244      PT LONG TERM GOAL #1   Title  Pt/husband will be independent with final HEP in  order to indicate improved flexibility and improved functional mobility.  (Target Date: 09/16/18)    Time  8    Period  Weeks    Status  New    Target Date  09/16/18      PT LONG TERM GOAL #2   Title  Pt will improve 6MWT distance by 150' from baseline with use of rollator in order to indicate improved functional endurance.     Time  8    Period  Weeks    Status  New    Target Date  09/16/18      PT LONG TERM GOAL #3   Title  Pt will report no more than 5/10 pain with functional mobility in order to indicate improved flexibility and function.     Time  8    Period  Weeks    Status  New    Target Date  09/16/18      PT LONG TERM GOAL #4   Title  Pt will improve gait velocity without AD to >/= 3.0 ft/sec    Time  8    Period  Weeks    Status  New    Target Date  09/16/18      PT LONG TERM GOAL #5   Title  Pt will decrease five time sit to stand without UE use and normal BOS to </= 13 seconds    Time  8    Period  Weeks    Status  New    Target Date  09/16/18      Additional Long Term Goals   Additional Long Term Goals  Yes      PT LONG TERM GOAL #6   Title  Pt will increase trunk flexion by 3-4" and trunk extension by 1-2"    Baseline  with tape measure: flexion 18" >21"; extension 18" > 17.5"    Time  8    Period  Weeks    Status  New    Target Date  09/16/18            Plan - 08/12/18 1236    Clinical Impression Statement  Session limited by pt being late due to weather/traffic, but remaining session focused on core strength and gait/endurance with rollator.  Pt tolerated all very well and she now has her rollator which was adjusted during session and her walking speed is already greatly improved from previous sessions.     Rehab Potential  Good    Clinical Impairments Affecting Rehab Potential  chronicity of deficits    PT Frequency  2x / week    PT Duration  8 weeks    PT Treatment/Interventions  ADLs/Self Care Home Management;Aquatic Therapy;Moist  Heat;Ultrasound;DME Instruction;Gait training;Stair training;Functional mobility training;Therapeutic activities;Therapeutic exercise;Balance training;Neuromuscular re-education;Patient/family education;Manual techniques;Passive range of motion;Dry needling;Energy conservation;Canalith Repostioning    PT Next Visit Plan  get her on [facility]'s pool PT schedule for Dec, STGs due to 08/17/18, Core strength on physioball,  TDN for: low back bilat, piriformis bilat, L quad.  RE-educate use of core and pelvic stability, use of glutes for sit <> stand.  hip strengthening    PT Home Exercise Plan  [id number]        Patient  will benefit from skilled therapeutic intervention in order to improve the following deficits and impairments:  Abnormal gait, Decreased activity tolerance, Decreased balance, Decreased endurance, Decreased knowledge of use of DME, Decreased mobility, Decreased range of motion, Decreased strength, Difficulty walking, Hypomobility, Impaired perceived functional ability, Impaired flexibility, Postural dysfunction, Pain, Impaired sensation  Visit Diagnosis: Chronic bilateral low back pain, unspecified whether sciatica present  Muscle weakness (generalized)  Other abnormalities of gait and mobility  Abnormal posture     Problem List Patient Active Problem List   Diagnosis Date Noted  . Post menopausal problems 01/08/2012    Class: Temporary  . Diabetes mellitus 01/08/2012    Class: Chronic  . Hypertension 01/08/2012    Class: Chronic  . Anxiety 01/08/2012    Class: Chronic  . Sleep apnea 01/08/2012    Class: Chronic  . Fibromyalgia syndrome 01/08/2012    Class: Chronic  . IBS (irritable bowel syndrome) 01/08/2012    Class: Chronic  . GERD (gastroesophageal reflux disease) 01/08/2012    Class: Chronic    Cameron Sprang, PT, MPT [facility] [address] [place], [place], [address] Phone: [phone]   Fax:   [phone] 08/12/18, 12:54 PM  Name: Holly Daniels MRN: [id number] Date of Birth: November 02, 1955

## 2018-08-15 ENCOUNTER — Ambulatory Visit: Payer: BLUE CROSS/BLUE SHIELD | Admitting: Rehabilitation

## 2018-08-19 ENCOUNTER — Ambulatory Visit: Payer: BLUE CROSS/BLUE SHIELD | Admitting: Rehabilitation

## 2018-08-22 ENCOUNTER — Ambulatory Visit: Payer: BLUE CROSS/BLUE SHIELD | Admitting: Rehabilitation

## 2018-08-22 ENCOUNTER — Encounter: Payer: Self-pay | Admitting: Rehabilitation

## 2018-08-22 DIAGNOSIS — M545 Low back pain: Principal | ICD-10-CM

## 2018-08-22 DIAGNOSIS — R42 Dizziness and giddiness: Secondary | ICD-10-CM | POA: Diagnosis not present

## 2018-08-22 DIAGNOSIS — R293 Abnormal posture: Secondary | ICD-10-CM | POA: Diagnosis not present

## 2018-08-22 DIAGNOSIS — G8929 Other chronic pain: Secondary | ICD-10-CM

## 2018-08-22 DIAGNOSIS — R2689 Other abnormalities of gait and mobility: Secondary | ICD-10-CM

## 2018-08-22 DIAGNOSIS — M6281 Muscle weakness (generalized): Secondary | ICD-10-CM | POA: Diagnosis not present

## 2018-08-22 NOTE — Therapy (Signed)
[facility] [address] [place], [place], [address] Phone: [phone]   Fax:  [phone]  Physical Therapy Treatment  Patient Details  Name: Holly Daniels MRN: [id number] Date of Birth: 07/07/1956 Referring Provider (PT): Latanya Presser, MD   Encounter Date: 08/22/2018  PT End of Session - 08/22/18 1322    Visit Number  7    Number of Visits  17    Date for PT Re-Evaluation  10/16/18    Authorization Type  BCBS    Authorization - Visit Number  7    Authorization - Number of Visits  90    PT Start Time  3875    PT Stop Time  1403    PT Time Calculation (min)  45 min    Activity Tolerance  Patient tolerated treatment well    Behavior During Therapy  [facility] for tasks assessed/performed       Past Medical History:  Diagnosis Date  . Anxiety   . Diabetes mellitus    oral meds  . Fibromyalgia   . GERD (gastroesophageal reflux disease)   . Headache(784.0)    sinus  . History of C-section    x2  . Hypertension   . OSA (obstructive sleep apnea)     Past Surgical History:  Procedure Laterality Date  . CESAREAN SECTION     x2  . CHOLECYSTECTOMY    . CHONDROPLASTY Left 09/29/2015   Procedure: CHONDROPLASTY;  Surgeon: Frederik Pear, MD;  Location: [facility];  Service: Orthopedics;  Laterality: Left;  . HYSTEROSCOPY W/D&C  01/14/2012   Procedure: DILATATION AND CURETTAGE /HYSTEROSCOPY;  Surgeon: Delice Lesch, MD;  Location: [facility];  Service: Gynecology;  Laterality: N/A;  . KNEE ARTHROSCOPY WITH MEDIAL MENISECTOMY Left 09/29/2015   Procedure: KNEE ARTHROSCOPY WITH MEDIAL MENISECTOMY AND CHONDROPLASTY;  Surgeon: Frederik Pear, MD;  Location: [facility];  Service: Orthopedics;  Laterality: Left;  . vulvar cyst      There were no vitals filed for this visit.  Subjective Assessment - 08/22/18 1321    Subjective  Was sick earlier in the week, but better now.  Got put on Z pack.     Limitations  House hold activities;Walking    How long can you sit comfortably?  as long as needed    How long can you stand comfortably?  8 mins    How long can you walk comfortably?  approx 100' before having sharp pain and feels like back "locks up."     Patient Stated Goals  "Get rid of pain, get back to doing things you want to do."     Currently in Pain?  Yes    Pain Score  7     Pain Location  Back    Pain Orientation  Lower;Right;Left    Pain Descriptors / Indicators  Sore;Sharp    Pain Type  Chronic pain    Pain Onset  More than a month ago    Pain Frequency  Constant    Aggravating Factors   standing, walking, driving, weather    Pain Relieving Factors  pain medication          [facility] PT Assessment - 08/22/18 1338      Ambulation/Gait   Gait velocity  3.30 ft/sec w/ rollator     Stairs  Yes    Stairs Assistance  5: Supervision    Stairs Assistance Details (indicate cue type and reason)  Pt able to ascend/descend  stairs using tripod cane and L rail in step to fashion at S level.  Cues for sequencing and technique.     Stair Management Technique  One rail Left;Step to pattern;Forwards;With cane    Number of Stairs  4    Height of Stairs  6    Ramp  5: Supervision    Ramp Details (indicate cue type and reason)  with cane    Curb  5: Supervision   min/guard   Curb Details (indicate cue type and reason)  Performed curb x 4 reps with cane only in order to prepare for being able to get into football stadium without rail.  Pt able to perform at S to min/guard level with cues for sequencing and technique.        6 Minute Walk- Baseline   6 Minute Walk- Baseline  yes    BP (mmHg)  133/78    HR (bpm)  92    02 Sat (%RA)  95 %    Modified Borg Scale for Dyspnea  3- Moderate shortness of breath or breathing difficulty    Perceived Rate of Exertion (Borg)  9- very light      6 Minute walk- Post Test   6 Minute Walk Post Test  yes    BP (mmHg)  127/76    HR (bpm)  94    02  Sat (%RA)  98 %    Modified Borg Scale for Dyspnea  3- Moderate shortness of breath or breathing difficulty    Perceived Rate of Exertion (Borg)  13- Somewhat hard      6 minute walk test results    Aerobic Endurance Distance Walked  913    Endurance additional comments  with rollator with only one single rest break for approx 45 secs             Self Care:  Reviewed improved sleeping positions during session.  Pt also reports she has acid reflux so needs to be elevated.  Got blue wedge plus 2-3 pillows and pt able to lie on her back with pillow under knees somewhat comfortably however would prefer to be on her side.  Pt able to lie on her side on wedge with pillow between legs and pillow under stomach for support very comfortably during session.  Showed pt how she could order wedge pillow and body pillow (only if wanting to reduce amount of pillows) to allow for improved and more restful sleep.  Pt verbalized understanding.               PT Education - 08/22/18 1518    Education Details  progress towards all goals.     Person(s) Educated  Patient    Methods  Explanation    Comprehension  Verbalized understanding       PT Short Term Goals - 08/22/18 1323      PT SHORT TERM GOAL #1   Title  Pt/husband will be independent with initial HEP in order to indicate improved flexibility and functional mobility.  (Target Date: 08/17/18)    Baseline  per verbal report 08/22/18    Time  4    Period  Weeks    Status  Achieved      PT SHORT TERM GOAL #2   Title  Pt will improve functional gait velocity to >/= 2.62 ft/sec without AD    Baseline  2.17 ft/sec to 3.30 ft/sec with rollator on 08/22/18    Time  4  Period  Weeks    Status  Achieved      PT SHORT TERM GOAL #3   Title  Pt will decrease five time sit to stand score to </=15 seconds with more normal BOS    Baseline  17.75 seconds with really wide BOS, improved to 10.97 secs without UE support 08/22/18    Time  4     Period  Weeks    Status  Achieved      PT SHORT TERM GOAL #4   Title  Will assess 6MWT with rollator and improve distance by 75' from baseline in order to indicate improved functional endurance.      Baseline  400' at baseline to 49' with rollator on 08/22/18    Time  4    Period  Weeks    Status  Achieved      PT SHORT TERM GOAL #5   Title  Pt will demonstrate improved sleeping position in order to indicate decreased pain at night.     Baseline  plan made 08/22/18    Time  4    Period  Weeks    Status  Partially Met      PT SHORT TERM GOAL #6   Title  Pt will report no more than 7/10 pain with functional mobility in order to indicate improved function and flexibility.    pt reports 7-9 most consistently (better from 15/10)   Time  4    Period  Weeks    Status  Partially Met        PT Long Term Goals - 08/22/18 1520      PT LONG TERM GOAL #1   Title  Pt/husband will be independent with final HEP in order to indicate improved flexibility and improved functional mobility.  (Target Date: 09/16/18)    Time  8    Period  Weeks    Status  New      PT LONG TERM GOAL #2   Title  Pt will improve 6MWT distance by 150' from STG distance with use of rollator in order to indicate improved functional endurance.     Baseline  913' at STG     Time  8    Period  Weeks    Status  Revised      PT LONG TERM GOAL #3   Title  Pt will report no more than 5/10 pain with functional mobility in order to indicate improved flexibility and function.     Time  8    Period  Weeks    Status  New      PT LONG TERM GOAL #4   Title  Pt will improve gait velocity without AD to >/= 3.0 ft/sec    Time  8    Period  Weeks      PT LONG TERM GOAL #5   Title  Pt will decrease five time sit to stand without UE use and normal BOS to </= 13 seconds    Baseline  met 08/22/18    Time  8    Period  Weeks    Status  Achieved      Additional Long Term Goals   Additional Long Term Goals  Yes      PT  LONG TERM GOAL #6   Title  Pt will increase trunk flexion by 3-4" and trunk extension by 1-2"    Baseline  with tape measure: flexion 18" >21"; extension 18" > 17.5"  Time  8    Period  Weeks    Status  New      PT LONG TERM GOAL #7   Title  Pt will ascend/descend 8 steps without rail with cane only in step to pattern at mod I level in order to be able to attend football games safely.     Time  8    Period  Weeks    Status  New    Target Date  09/16/18            Plan - 08/22/18 1518    Clinical Impression Statement  Skilled session focused on assessment of STGs.  Pt has met 4/6 STGs, partially meeting remaining two goals for sleeping position (we went over during session) and pain goal.  Pt making excellent progress towards goal as she has also met her LTG for 6MWT.  Will update as needed.     Rehab Potential  Good    Clinical Impairments Affecting Rehab Potential  chronicity of deficits    PT Frequency  2x / week    PT Duration  8 weeks    PT Treatment/Interventions  ADLs/Self Care Home Management;Aquatic Therapy;Moist Heat;Ultrasound;DME Instruction;Gait training;Stair training;Functional mobility training;Therapeutic activities;Therapeutic exercise;Balance training;Neuromuscular re-education;Patient/family education;Manual techniques;Passive range of motion;Dry needling;Energy conservation;Canalith Repostioning    PT Next Visit Plan  get her on [facility] pool PT schedule for Dec (make sure she can do 12/11 at 345 and 12/17 at 130), Core strength on physioball,  TDN for: low back bilat, piriformis bilat, L quad.  RE-educate use of core and pelvic stability, use of glutes for sit <> stand.  hip strengthening    PT Home Exercise Plan  [id number]        Patient will benefit from skilled therapeutic intervention in order to improve the following deficits and impairments:  Abnormal gait, Decreased activity tolerance, Decreased balance, Decreased endurance, Decreased knowledge of use of  DME, Decreased mobility, Decreased range of motion, Decreased strength, Difficulty walking, Hypomobility, Impaired perceived functional ability, Impaired flexibility, Postural dysfunction, Pain, Impaired sensation  Visit Diagnosis: Chronic bilateral low back pain, unspecified whether sciatica present  Muscle weakness (generalized)  Other abnormalities of gait and mobility  Abnormal posture     Problem List Patient Active Problem List   Diagnosis Date Noted  . Post menopausal problems 01/08/2012    Class: Temporary  . Diabetes mellitus 01/08/2012    Class: Chronic  . Hypertension 01/08/2012    Class: Chronic  . Anxiety 01/08/2012    Class: Chronic  . Sleep apnea 01/08/2012    Class: Chronic  . Fibromyalgia syndrome 01/08/2012    Class: Chronic  . IBS (irritable bowel syndrome) 01/08/2012    Class: Chronic  . GERD (gastroesophageal reflux disease) 01/08/2012    Class: Chronic    Cameron Sprang, PT, MPT [facility] [address] [place], [place], [address] Phone: [phone]   Fax:  [phone] 08/22/18, 3:27 PM  Name: Holly Daniels MRN: [id number] Date of Birth: 12/30/1955

## 2018-08-23 ENCOUNTER — Ambulatory Visit
Admission: RE | Admit: 2018-08-23 | Discharge: 2018-08-23 | Disposition: A | Discharge status: Home / Self Care | Payer: BLUE CROSS/BLUE SHIELD | Source: Ambulatory Visit | Attending: Internal Medicine | Admitting: Internal Medicine

## 2018-08-23 DIAGNOSIS — M545 Low back pain, unspecified: Secondary | ICD-10-CM

## 2018-09-01 ENCOUNTER — Ambulatory Visit: Payer: BLUE CROSS/BLUE SHIELD | Attending: Cardiology | Admitting: Rehabilitation

## 2018-09-01 ENCOUNTER — Encounter: Payer: Self-pay | Admitting: Rehabilitation

## 2018-09-01 DIAGNOSIS — M545 Low back pain, unspecified: Secondary | ICD-10-CM

## 2018-09-01 DIAGNOSIS — G8929 Other chronic pain: Secondary | ICD-10-CM | POA: Diagnosis not present

## 2018-09-01 DIAGNOSIS — R293 Abnormal posture: Secondary | ICD-10-CM | POA: Insufficient documentation

## 2018-09-01 DIAGNOSIS — M6281 Muscle weakness (generalized): Secondary | ICD-10-CM | POA: Insufficient documentation

## 2018-09-01 DIAGNOSIS — R2689 Other abnormalities of gait and mobility: Secondary | ICD-10-CM | POA: Insufficient documentation

## 2018-09-01 NOTE — Therapy (Signed)
[facility] [address] [place], [place], [address] Phone: [phone]   Fax:  [phone]  Physical Therapy Treatment  Patient Details  Name: Holly Daniels MRN: [id number] Date of Birth: Jan 29, 1956 Referring Provider (PT): Latanya Presser, MD   Encounter Date: 09/01/2018  PT End of Session - 09/01/18 1329    Visit Number  8    Number of Visits  17    Date for PT Re-Evaluation  10/16/18    Authorization Type  BCBS    Authorization - Visit Number  8    Authorization - Number of Visits  90    PT Start Time  5188    PT Stop Time  1400    PT Time Calculation (min)  43 min    Activity Tolerance  Patient tolerated treatment well    Behavior During Therapy  [facility] for tasks assessed/performed       Past Medical History:  Diagnosis Date  . Anxiety   . Diabetes mellitus    oral meds  . Fibromyalgia   . GERD (gastroesophageal reflux disease)   . Headache(784.0)    sinus  . History of C-section    x2  . Hypertension   . OSA (obstructive sleep apnea)     Past Surgical History:  Procedure Laterality Date  . CESAREAN SECTION     x2  . CHOLECYSTECTOMY    . CHONDROPLASTY Left 09/29/2015   Procedure: CHONDROPLASTY;  Surgeon: Frederik Pear, MD;  Location: [facility];  Service: Orthopedics;  Laterality: Left;  . HYSTEROSCOPY W/D&C  01/14/2012   Procedure: DILATATION AND CURETTAGE /HYSTEROSCOPY;  Surgeon: Delice Lesch, MD;  Location: [facility];  Service: Gynecology;  Laterality: N/A;  . KNEE ARTHROSCOPY WITH MEDIAL MENISECTOMY Left 09/29/2015   Procedure: KNEE ARTHROSCOPY WITH MEDIAL MENISECTOMY AND CHONDROPLASTY;  Surgeon: Frederik Pear, MD;  Location: [facility];  Service: Orthopedics;  Laterality: Left;  . vulvar cyst      There were no vitals filed for this visit.  Subjective Assessment - 09/01/18 1319    Subjective  Pt reports having a good holiday.  Tripped going up stairs over last week.  No injuries.     Limitations  House hold activities;Walking    How long can you sit comfortably?  as long as needed    How long can you stand comfortably?  8 mins    How long can you walk comfortably?  approx 100' before having sharp pain and feels like back "locks up."     Patient Stated Goals  "Get rid of pain, get back to doing things you want to do."     Currently in Pain?  Yes    Pain Score  6     Pain Location  Back    Pain Orientation  Lower;Right;Left    Pain Descriptors / Indicators  Sore;Sharp    Pain Type  Chronic pain    Pain Onset  More than a month ago    Pain Frequency  Constant    Aggravating Factors   standing walking, driving, weather    Pain Relieving Factors  pain medication                       [facility] Adult PT Treatment/Exercise - 09/01/18 1346      Ambulation/Gait   Stairs  Yes    Stairs Assistance  5: Supervision    Stairs Assistance Details (indicate cue type and reason)  Continue to work on stairs for BLE strengthening and less UE support.  Had her perform with single rail as she had over the weekend, and pt able to perform with single rail but heavy reliance on UE support. Note increased L knee pain due to arthritis.      Stair Management Technique  One rail Right;Step to pattern;Forwards    Number of Stairs  12    Height of Stairs  6      Self-Care   Self-Care  Other Self-Care Comments    Other Self-Care Comments   Discussed benefits of aquatic PT and that PT had held 2 possible slots for her on that PTs schedule.  Pt initially reluctant, however agreed when educated on benefits for strengthening and decreasing low back pain.  Pt to do 12/11 with [facility].  Handout on how to be prepared given and gone over verbally during session.       Exercises   Other Exercises   BLE strengthening: In // bars for UE support as needed-forwards/backwards step ups on aerobic step x 10 reps each direction progressing to lateral step ups x 10 reps.  Cues for  decreased UE support, upright posture and improved proximal hip control.  Core strengthenthing: sitting on physioball performing marching x 10 reps total progressing to LAQ's x 10 reps, Holding 2.2 lbs weighted ball moving in diagonal patterns x 10 reps.  with arms extended moving from side to side x 10 reps,  supine posterior pelvic tilt x 10 reps, hooklying marching x 10 reps progressing to only light touches x 10 then bicycles x 3 sets of 10 sec count. BLE bridging on physioball x 10 reps, BLE bridging with hamstring curls on physioball x 10 reps with heavy cues for proper technique.  Pt only able to lift hips from mat slightly.                 PT Education - 09/01/18 1552    Education Details  benefits of aquatic PT, handout on going to [facility] for aquatic PT    Person(s) Educated  Patient    Methods  Explanation;Handout    Comprehension  Verbalized understanding       PT Short Term Goals - 08/22/18 1323      PT SHORT TERM GOAL #1   Title  Pt/husband will be independent with initial HEP in order to indicate improved flexibility and functional mobility.  (Target Date: 08/17/18)    Baseline  per verbal report 08/22/18    Time  4    Period  Weeks    Status  Achieved      PT SHORT TERM GOAL #2   Title  Pt will improve functional gait velocity to >/= 2.62 ft/sec without AD    Baseline  2.17 ft/sec to 3.30 ft/sec with rollator on 08/22/18    Time  4    Period  Weeks    Status  Achieved      PT SHORT TERM GOAL #3   Title  Pt will decrease five time sit to stand score to </=15 seconds with more normal BOS    Baseline  17.75 seconds with really wide BOS, improved to 10.97 secs without UE support 08/22/18    Time  4    Period  Weeks    Status  Achieved      PT SHORT TERM GOAL #4   Title  Will assess 6MWT with rollator and improve distance by 75' from baseline in order  to indicate improved functional endurance.      Baseline  400' at baseline to 89' with rollator on 08/22/18    Time   4    Period  Weeks    Status  Achieved      PT SHORT TERM GOAL #5   Title  Pt will demonstrate improved sleeping position in order to indicate decreased pain at night.     Baseline  plan made 08/22/18    Time  4    Period  Weeks    Status  Partially Met      PT SHORT TERM GOAL #6   Title  Pt will report no more than 7/10 pain with functional mobility in order to indicate improved function and flexibility.    pt reports 7-9 most consistently (better from 15/10)   Time  4    Period  Weeks    Status  Partially Met        PT Long Term Goals - 08/22/18 1520      PT LONG TERM GOAL #1   Title  Pt/husband will be independent with final HEP in order to indicate improved flexibility and improved functional mobility.  (Target Date: 09/16/18)    Time  8    Period  Weeks    Status  New      PT LONG TERM GOAL #2   Title  Pt will improve 6MWT distance by 150' from STG distance with use of rollator in order to indicate improved functional endurance.     Baseline  913' at STG     Time  8    Period  Weeks    Status  Revised      PT LONG TERM GOAL #3   Title  Pt will report no more than 5/10 pain with functional mobility in order to indicate improved flexibility and function.     Time  8    Period  Weeks    Status  New      PT LONG TERM GOAL #4   Title  Pt will improve gait velocity without AD to >/= 3.0 ft/sec    Time  8    Period  Weeks      PT LONG TERM GOAL #5   Title  Pt will decrease five time sit to stand without UE use and normal BOS to </= 13 seconds    Baseline  met 08/22/18    Time  8    Period  Weeks    Status  Achieved      Additional Long Term Goals   Additional Long Term Goals  Yes      PT LONG TERM GOAL #6   Title  Pt will increase trunk flexion by 3-4" and trunk extension by 1-2"    Baseline  with tape measure: flexion 18" >21"; extension 18" > 17.5"    Time  8    Period  Weeks    Status  New      PT LONG TERM GOAL #7   Title  Pt will ascend/descend 8  steps without rail with cane only in step to pattern at mod I level in order to be able to attend football games safely.     Time  8    Period  Weeks    Status  New    Target Date  09/16/18            Plan - 09/01/18 1553    Clinical Impression Statement  Skilled session continues to focus on BLE strengthening as well as core strengthening.  Pt able to tolerate more aggressive exercises, however was limited due to B knee pain.      Rehab Potential  Good    Clinical Impairments Affecting Rehab Potential  chronicity of deficits    PT Frequency  2x / week    PT Duration  8 weeks    PT Treatment/Interventions  ADLs/Self Care Home Management;Aquatic Therapy;Moist Heat;Ultrasound;DME Instruction;Gait training;Stair training;Functional mobility training;Therapeutic activities;Therapeutic exercise;Balance training;Neuromuscular re-education;Patient/family education;Manual techniques;Passive range of motion;Dry needling;Energy conservation;Canalith Repostioning    PT Next Visit Plan  Core strength on physioball,  TDN for: low back bilat, piriformis bilat, L quad.  RE-educate use of core and pelvic stability, use of glutes for sit <> stand.  hip strengthening    PT Home Exercise Plan  [email or web address]        Patient will benefit from skilled therapeutic intervention in order to improve the following deficits and impairments:  Abnormal gait, Decreased activity tolerance, Decreased balance, Decreased endurance, Decreased knowledge of use of DME, Decreased mobility, Decreased range of motion, Decreased strength, Difficulty walking, Hypomobility, Impaired perceived functional ability, Impaired flexibility, Postural dysfunction, Pain, Impaired sensation  Visit Diagnosis: Chronic bilateral low back pain, unspecified whether sciatica present  Muscle weakness (generalized)  Other abnormalities of gait and mobility  Abnormal posture     Problem List Patient Active Problem List   Diagnosis Date Noted   . Post menopausal problems 01/08/2012    Class: Temporary  . Diabetes mellitus 01/08/2012    Class: Chronic  . Hypertension 01/08/2012    Class: Chronic  . Anxiety 01/08/2012    Class: Chronic  . Sleep apnea 01/08/2012    Class: Chronic  . Fibromyalgia syndrome 01/08/2012    Class: Chronic  . IBS (irritable bowel syndrome) 01/08/2012    Class: Chronic  . GERD (gastroesophageal reflux disease) 01/08/2012    Class: Chronic    Cameron Sprang, PT, MPT [facility] [address] [place], [place], [address] Phone: [phone]   Fax:  [phone] 09/01/18, 3:55 PM  Name: Opha Mcghee MRN: [id number] Date of Birth: Mar 03, 1956

## 2018-09-01 NOTE — Patient Instructions (Signed)
  Aquatic Therapy: What to Expect!  Where:  [facility] [address] [place], [place]  [address] [phone]  NOTE:  You will receive an automated phone message reminding you of your appointment and it will say the appointment is at the Rehab Center on [address]  We are working to fix this- just know that you will meet us at the pool!  How to Prepare: . Please make sure you drink 8 ounces of water about one hour prior to your pool session . A caregiver MUST attend the entire session with the patient.  The caregiver will be responsible for assisting with dressing as well as any toileting needs.  If the patient will be doing a home program this should likely be the person who will assist as well.  . Patients must wear either their street shoes or pool shoes until they are ready to enter the pool with the therapist.  Patients must also wear either street shoes or pool shoes once exiting the pool to walk to the locker room.  This will helps us prevent slips and falls.  . Please arrive 15 minutes early to prepare for your pool therapy session . Sign in at the front desk on the clipboard marked for [place] . You may use the locker rooms on your right and then enter directly into the recreation pool (NOT the competition pool) . Please make sure to attend to any toileting needs prior to entering the pool . Please be dressed in your swim suit and on the pool deck at least 5 minutes before your appointment . Once on the pool deck your therapist will ask you to sign the Patient  Consent and Assignment of Benefits form . Your therapist may take your blood pressure prior to, during and after your session if indicated  About the pool: 1. Entering the pool Your therapist will assist you; there are multiple ways to enter including stairs with railings, a walk in ramp, a roll in chair and a mechanical lift. Your therapist will determine the most appropriate way for you. 2. Water  temperature is usually between 86-87 degrees 3. There may be other swimmers in the pool at the same time   Contact Info:     Appointments: [facility]  All sessions are 45 minutes   [address]  [address]     Please call the [facility] if   [place], [place]   [address]    you need to cancel or reschedule an appointment.  [phone]       

## 2018-09-02 ENCOUNTER — Other Ambulatory Visit: Payer: Self-pay | Admitting: Internal Medicine

## 2018-09-02 DIAGNOSIS — N83202 Unspecified ovarian cyst, left side: Secondary | ICD-10-CM

## 2018-09-04 ENCOUNTER — Ambulatory Visit: Payer: BLUE CROSS/BLUE SHIELD | Admitting: Rehabilitation

## 2018-09-04 ENCOUNTER — Encounter: Payer: Self-pay | Admitting: Rehabilitation

## 2018-09-04 DIAGNOSIS — R293 Abnormal posture: Secondary | ICD-10-CM

## 2018-09-04 DIAGNOSIS — R2689 Other abnormalities of gait and mobility: Secondary | ICD-10-CM | POA: Diagnosis not present

## 2018-09-04 DIAGNOSIS — G8929 Other chronic pain: Secondary | ICD-10-CM

## 2018-09-04 DIAGNOSIS — M6281 Muscle weakness (generalized): Secondary | ICD-10-CM

## 2018-09-04 DIAGNOSIS — M545 Low back pain: Principal | ICD-10-CM

## 2018-09-04 NOTE — Therapy (Signed)
[facility] [address] [place], [place], [address] Phone: [phone]   Fax:  [phone]  Physical Therapy Treatment  Patient Details  Name: Holly Daniels MRN: [id number] Date of Birth: [birth year] Referring Provider (PT): Latanya Presser, MD   Encounter Date: 09/04/2018  PT End of Session - 09/04/18 1440    Visit Number  9    Number of Visits  17    Date for PT Re-Evaluation  10/16/18    Authorization Type  BCBS    Authorization - Visit Number  9    Authorization - Number of Visits  90    PT Start Time  4403    PT Stop Time  1402    PT Time Calculation (min)  45 min    Activity Tolerance  Patient tolerated treatment well    Behavior During Therapy  [facility] for tasks assessed/performed       Past Medical History:  Diagnosis Date  . Anxiety   . Diabetes mellitus    oral meds  . Fibromyalgia   . GERD (gastroesophageal reflux disease)   . Headache(784.0)    sinus  . History of C-section    x2  . Hypertension   . OSA (obstructive sleep apnea)     Past Surgical History:  Procedure Laterality Date  . CESAREAN SECTION     x2  . CHOLECYSTECTOMY    . CHONDROPLASTY Left 09/29/2015   Procedure: CHONDROPLASTY;  Surgeon: Frederik Pear, MD;  Location: [facility];  Service: Orthopedics;  Laterality: Left;  . HYSTEROSCOPY W/D&C  01/14/2012   Procedure: DILATATION AND CURETTAGE /HYSTEROSCOPY;  Surgeon: Delice Lesch, MD;  Location: [facility];  Service: Gynecology;  Laterality: N/A;  . KNEE ARTHROSCOPY WITH MEDIAL MENISECTOMY Left 09/29/2015   Procedure: KNEE ARTHROSCOPY WITH MEDIAL MENISECTOMY AND CHONDROPLASTY;  Surgeon: Frederik Pear, MD;  Location: [facility];  Service: Orthopedics;  Laterality: Left;  . vulvar cyst      There were no vitals filed for this visit.  Subjective Assessment - 09/04/18 1438    Subjective  Pt reports she "over did it" yesterday walking a lot without walker  helping dad look for lost money.      Limitations  House hold activities;Walking    How long can you sit comfortably?  as long as needed    How long can you stand comfortably?  8 mins    How long can you walk comfortably?  approx 100' before having sharp pain and feels like back "locks up."     Patient Stated Goals  "Get rid of pain, get back to doing things you want to do."     Currently in Pain?  Yes    Pain Score  8     Pain Location  Back    Pain Orientation  Lower;Right;Left    Pain Descriptors / Indicators  Aching;Sore;Sharp    Pain Type  Chronic pain    Pain Onset  More than a month ago    Pain Frequency  Constant    Aggravating Factors   standing, walking, driving, weather     Pain Relieving Factors  pain medication                        [facility] Adult PT Treatment/Exercise - 09/04/18 1320      Ambulation/Gait   Ambulation/Gait  Yes    Ambulation/Gait Assistance  6: Modified independent (Device/Increase time);5: Supervision  Ambulation/Gait Assistance Details  Performed single large lap following stretching with rollator at mod I to S level for min cues for upright posture and improved core activation.         TE:  B supine passive hamstring stretch x 30 secs, B knee to chest (one at a time) x 30 secs each, B hip add (one at a time-half butterfly stretch) x 30 secs each, B piriformis stretch x 30 secs each.  Marching in // bars with single UE support for lower abdominal activation and B hip strengthening x 20 reps, standing oblique crunch (moving UE from extended position/LE in extended position to hip and knee flexion) x 15 reps on each side, Standing alternating cone taps for balance, but also for stability in proximal hip x 10 reps progressing to tapping across and back anteriorly x 10 reps each, hooklying on blue wedge performing mini crunch x 10 reps with cues for technique, sitting upright with knees flexed (feet touching mat) moving BUEs from one side of hips  to the other for core strengthening x 10 reps on each side.  Seated stepper with BUEs/LE at level 2 resistance x 4 mins maintaining rpm's in the 40's throughout.        PT Education - 09/04/18 1440    Education Details  Education on using rollator for longer distances     Person(s) Educated  Patient    Methods  Explanation    Comprehension  Verbalized understanding       PT Short Term Goals - 08/22/18 1323      PT SHORT TERM GOAL #1   Title  Pt/husband will be independent with initial HEP in order to indicate improved flexibility and functional mobility.  (Target Date: 08/17/18)    Baseline  per verbal report 08/22/18    Time  4    Period  Weeks    Status  Achieved      PT SHORT TERM GOAL #2   Title  Pt will improve functional gait velocity to >/= 2.62 ft/sec without AD    Baseline  2.17 ft/sec to 3.30 ft/sec with rollator on 08/22/18    Time  4    Period  Weeks    Status  Achieved      PT SHORT TERM GOAL #3   Title  Pt will decrease five time sit to stand score to </=15 seconds with more normal BOS    Baseline  17.75 seconds with really wide BOS, improved to 10.97 secs without UE support 08/22/18    Time  4    Period  Weeks    Status  Achieved      PT SHORT TERM GOAL #4   Title  Will assess 6MWT with rollator and improve distance by 75' from baseline in order to indicate improved functional endurance.      Baseline  400' at baseline to 73' with rollator on 08/22/18    Time  4    Period  Weeks    Status  Achieved      PT SHORT TERM GOAL #5   Title  Pt will demonstrate improved sleeping position in order to indicate decreased pain at night.     Baseline  plan made 08/22/18    Time  4    Period  Weeks    Status  Partially Met      PT SHORT TERM GOAL #6   Title  Pt will report no more than 7/10 pain with functional mobility  in order to indicate improved function and flexibility.    pt reports 7-9 most consistently (better from 15/10)   Time  4    Period  Weeks     Status  Partially Met        PT Long Term Goals - 08/22/18 1520      PT LONG TERM GOAL #1   Title  Pt/husband will be independent with final HEP in order to indicate improved flexibility and improved functional mobility.  (Target Date: 09/16/18)    Time  8    Period  Weeks    Status  New      PT LONG TERM GOAL #2   Title  Pt will improve 6MWT distance by 150' from STG distance with use of rollator in order to indicate improved functional endurance.     Baseline  913' at STG     Time  8    Period  Weeks    Status  Revised      PT LONG TERM GOAL #3   Title  Pt will report no more than 5/10 pain with functional mobility in order to indicate improved flexibility and function.     Time  8    Period  Weeks    Status  New      PT LONG TERM GOAL #4   Title  Pt will improve gait velocity without AD to >/= 3.0 ft/sec    Time  8    Period  Weeks      PT LONG TERM GOAL #5   Title  Pt will decrease five time sit to stand without UE use and normal BOS to </= 13 seconds    Baseline  met 08/22/18    Time  8    Period  Weeks    Status  Achieved      Additional Long Term Goals   Additional Long Term Goals  Yes      PT LONG TERM GOAL #6   Title  Pt will increase trunk flexion by 3-4" and trunk extension by 1-2"    Baseline  with tape measure: flexion 18" >21"; extension 18" > 17.5"    Time  8    Period  Weeks    Status  New      PT LONG TERM GOAL #7   Title  Pt will ascend/descend 8 steps without rail with cane only in step to pattern at mod I level in order to be able to attend football games safely.     Time  8    Period  Weeks    Status  New    Target Date  09/16/18            Plan - 09/04/18 1441    Clinical Impression Statement  Skilled session focused on core strengthening, BLE strength and endurance.  Pt continues to be able to tolerate more and more activity.  PT did begin with stretching today due to increased pain and tightness reported.      Rehab Potential   Good    Clinical Impairments Affecting Rehab Potential  chronicity of deficits    PT Frequency  2x / week    PT Duration  8 weeks    PT Treatment/Interventions  ADLs/Self Care Home Management;Aquatic Therapy;Moist Heat;Ultrasound;DME Instruction;Gait training;Stair training;Functional mobility training;Therapeutic activities;Therapeutic exercise;Balance training;Neuromuscular re-education;Patient/family education;Manual techniques;Passive range of motion;Dry needling;Energy conservation;Canalith Repostioning    PT Next Visit Plan  Core strength on physioball,  TDN for: low  back bilat, piriformis bilat, L quad.  RE-educate use of core and pelvic stability, use of glutes for sit <> stand.  hip strengthening    PT Home Exercise Plan  [email or web address]        Patient will benefit from skilled therapeutic intervention in order to improve the following deficits and impairments:  Abnormal gait, Decreased activity tolerance, Decreased balance, Decreased endurance, Decreased knowledge of use of DME, Decreased mobility, Decreased range of motion, Decreased strength, Difficulty walking, Hypomobility, Impaired perceived functional ability, Impaired flexibility, Postural dysfunction, Pain, Impaired sensation  Visit Diagnosis: Chronic bilateral low back pain, unspecified whether sciatica present  Muscle weakness (generalized)  Other abnormalities of gait and mobility  Abnormal posture     Problem List Patient Active Problem List   Diagnosis Date Noted  . Post menopausal problems 01/08/2012    Class: Temporary  . Diabetes mellitus 01/08/2012    Class: Chronic  . Hypertension 01/08/2012    Class: Chronic  . Anxiety 01/08/2012    Class: Chronic  . Sleep apnea 01/08/2012    Class: Chronic  . Fibromyalgia syndrome 01/08/2012    Class: Chronic  . IBS (irritable bowel syndrome) 01/08/2012    Class: Chronic  . GERD (gastroesophageal reflux disease) 01/08/2012    Class: Chronic    Cameron Sprang, PT,  MPT [facility] [address] [place], [place], [address] Phone: [phone]   Fax:  [phone] 09/04/18, 2:50 PM  Name: Holly Daniels MRN: [id number] Date of Birth: 04/29/1956

## 2018-09-08 ENCOUNTER — Ambulatory Visit: Payer: BLUE CROSS/BLUE SHIELD | Admitting: Rehabilitation

## 2018-09-09 ENCOUNTER — Ambulatory Visit
Admission: RE | Admit: 2018-09-09 | Discharge: 2018-09-09 | Disposition: A | Discharge status: Home / Self Care | Payer: BLUE CROSS/BLUE SHIELD | Source: Ambulatory Visit | Attending: Internal Medicine | Admitting: Internal Medicine

## 2018-09-09 DIAGNOSIS — N83202 Unspecified ovarian cyst, left side: Secondary | ICD-10-CM

## 2018-09-10 ENCOUNTER — Ambulatory Visit: Payer: BLUE CROSS/BLUE SHIELD | Admitting: Physical Therapy

## 2018-09-10 DIAGNOSIS — N83202 Unspecified ovarian cyst, left side: Secondary | ICD-10-CM | POA: Diagnosis not present

## 2018-09-10 DIAGNOSIS — M545 Low back pain: Secondary | ICD-10-CM | POA: Diagnosis not present

## 2018-09-10 DIAGNOSIS — E1165 Type 2 diabetes mellitus with hyperglycemia: Secondary | ICD-10-CM | POA: Diagnosis not present

## 2018-09-10 DIAGNOSIS — Z01419 Encounter for gynecological examination (general) (routine) without abnormal findings: Secondary | ICD-10-CM | POA: Diagnosis not present

## 2018-09-11 ENCOUNTER — Ambulatory Visit: Payer: BLUE CROSS/BLUE SHIELD | Admitting: Rehabilitation

## 2018-09-15 ENCOUNTER — Encounter: Payer: Self-pay | Admitting: Rehabilitation

## 2018-09-15 ENCOUNTER — Ambulatory Visit: Payer: BLUE CROSS/BLUE SHIELD | Admitting: Rehabilitation

## 2018-09-15 DIAGNOSIS — R2689 Other abnormalities of gait and mobility: Secondary | ICD-10-CM | POA: Diagnosis not present

## 2018-09-15 DIAGNOSIS — M545 Low back pain: Secondary | ICD-10-CM | POA: Diagnosis not present

## 2018-09-15 DIAGNOSIS — G8929 Other chronic pain: Secondary | ICD-10-CM

## 2018-09-15 DIAGNOSIS — M6281 Muscle weakness (generalized): Secondary | ICD-10-CM | POA: Diagnosis not present

## 2018-09-15 DIAGNOSIS — R293 Abnormal posture: Secondary | ICD-10-CM | POA: Diagnosis not present

## 2018-09-15 NOTE — Patient Instructions (Signed)
Access Code: [email or web address]  URL: [email or web address]  Date: 09/15/2018  Prepared by: Cameron Sprang   Exercises  Single Knee to Chest Stretch - 3 reps - 1 sets - 20 hold - 3x daily - 7x weekly  Seated Hamstring Stretch (BKA) - 3 reps - 1 sets - 20 hold - 3x daily - 7x weekly  Seated Slump Neural Tensioner - 5 reps - 2 sets - 2-3 seconds hold - 1x daily - 7x weekly  Hooklying Isometric Hip Abduction with Belt - 10 reps - 1 sets - 5 hold - 1x daily - 7x weekly  Clamshell - 10 reps - 1 sets - 1x daily - 7x weekly  Seated Table Piriformis Stretch - 10 reps - 3 sets - 20-30 hold - 1x daily - 7x weekly

## 2018-09-15 NOTE — Therapy (Signed)
[facility] [address] [place], [place], [address] Phone: [phone]   Fax:  [phone]  Physical Therapy Treatment  Patient Details  Name: Holly Daniels MRN: [id number] Date of Birth: 1956/04/13 Referring Provider (PT): Latanya Presser, MD   Encounter Date: 09/15/2018  PT End of Session - 09/15/18 1325    Visit Number  10    Number of Visits  17    Date for PT Re-Evaluation  10/16/18    Authorization Type  BCBS    Authorization - Visit Number  10    Authorization - Number of Visits  90    PT Start Time  3435    PT Stop Time  1402    PT Time Calculation (min)  45 min    Activity Tolerance  Patient tolerated treatment well    Behavior During Therapy  [facility] for tasks assessed/performed       Past Medical History:  Diagnosis Date  . Anxiety   . Diabetes mellitus    oral meds  . Fibromyalgia   . GERD (gastroesophageal reflux disease)   . Headache(784.0)    sinus  . History of C-section    x2  . Hypertension   . OSA (obstructive sleep apnea)     Past Surgical History:  Procedure Laterality Date  . CESAREAN SECTION     x2  . CHOLECYSTECTOMY    . CHONDROPLASTY Left 09/29/2015   Procedure: CHONDROPLASTY;  Surgeon: Frederik Pear, MD;  Location: [facility];  Service: Orthopedics;  Laterality: Left;  . HYSTEROSCOPY W/D&C  01/14/2012   Procedure: DILATATION AND CURETTAGE /HYSTEROSCOPY;  Surgeon: Delice Lesch, MD;  Location: [facility];  Service: Gynecology;  Laterality: N/A;  . KNEE ARTHROSCOPY WITH MEDIAL MENISECTOMY Left 09/29/2015   Procedure: KNEE ARTHROSCOPY WITH MEDIAL MENISECTOMY AND CHONDROPLASTY;  Surgeon: Frederik Pear, MD;  Location: [facility];  Service: Orthopedics;  Laterality: Left;  . vulvar cyst      There were no vitals filed for this visit.  Subjective Assessment - 09/15/18 1321    Subjective  "I'm hurting today.  I notice that I keep getting these cramps, like if  I'm reaching behind me or out."  Note husband present during session, reporting that she does better here than she does at home.     Patient is accompained by:  Family member    Limitations  House hold activities;Walking    How long can you sit comfortably?  as long as needed    How long can you stand comfortably?  8 mins    How long can you walk comfortably?  approx 100' before having sharp pain and feels like back "locks up."     Patient Stated Goals  "Get rid of pain, get back to doing things you want to do."     Currently in Pain?  Yes    Pain Score  8     Pain Location  Back    Pain Orientation  Right;Left;Lower    Pain Descriptors / Indicators  Aching;Burning;Stabbing    Pain Type  Chronic pain    Pain Radiating Towards  Back down into hips and to knees    Pain Onset  More than a month ago    Pain Frequency  Constant    Aggravating Factors   standing, walking, driving, weather     Pain Relieving Factors  pain medication  [facility] Adult PT Treatment/Exercise - 09/15/18 1330      Ambulation/Gait   Ambulation/Gait  Yes    Ambulation/Gait Assistance  6: Modified independent (Device/Increase time)    Ambulation/Gait Assistance Details  Had pt ambulate short distance x 100' without AD in order to assess quality and gait speed.  Pt does very well without AD during session.  Husband reports she does better here.  Pt reports she feels she needs to "show up" when here.  Encouraged her to be aware of posture and quality of movement at all times.      Ambulation Distance (Feet)  100 Feet    Assistive device  None    Gait Pattern  Step-through pattern;Decreased stride length;Trendelenburg;Lateral hip instability;Wide base of support    Ambulation Surface  Level;Indoor    Gait velocity  3.22 ft/sec without AD    Stairs  Yes    Stairs Assistance  6: Modified independent (Device/Increase time)    Stairs Assistance Details (indicate cue type and reason)  Pt  able to ascend/descend stairs with hurry cane only in step to pattern at mod I level during session to simulate getting to stadium seating for football games.     Stair Management Technique  No rails;Step to pattern;Forwards;With cane    Number of Stairs  8    Height of Stairs  6      Exercises   Other Exercises   Reviewed all HEP exercises to ensure technique as pt reports cramping with two exercises.  Provided cues for using step stool for hamstring stretch and switched piriformis stretch to seated single leg bent on bed/couch.  See pt instruction for details.            Access Code: [email or web address]  URL: [email or web address]  Date: 09/15/2018  Prepared by: Cameron Sprang   Exercises  Single Knee to Chest Stretch - 3 reps - 1 sets - 20 hold - 3x daily - 7x weekly  Seated Hamstring Stretch (BKA) - 3 reps - 1 sets - 20 hold - 3x daily - 7x weekly  Seated Slump Neural Tensioner - 5 reps - 2 sets - 2-3 seconds hold - 1x daily - 7x weekly  Hooklying Isometric Hip Abduction with Belt - 10 reps - 1 sets - 5 hold - 1x daily - 7x weekly  Clamshell - 10 reps - 1 sets - 1x daily - 7x weekly  Seated Table Piriformis Stretch - 10 reps - 3 sets - 20-30 hold - 1x daily - 7x weekly      PT Short Term Goals - 08/22/18 1323      PT SHORT TERM GOAL #1   Title  Pt/husband will be independent with initial HEP in order to indicate improved flexibility and functional mobility.  (Target Date: 08/17/18)    Baseline  per verbal report 08/22/18    Time  4    Period  Weeks    Status  Achieved      PT SHORT TERM GOAL #2   Title  Pt will improve functional gait velocity to >/= 2.62 ft/sec without AD    Baseline  2.17 ft/sec to 3.30 ft/sec with rollator on 08/22/18    Time  4    Period  Weeks    Status  Achieved      PT SHORT TERM GOAL #3   Title  Pt will decrease five time sit to stand score to </=15 seconds with more normal BOS    Baseline  17.75 seconds  with really wide BOS, improved to  10.97 secs without UE support 08/22/18    Time  4    Period  Weeks    Status  Achieved      PT SHORT TERM GOAL #4   Title  Will assess 6MWT with rollator and improve distance by 75' from baseline in order to indicate improved functional endurance.      Baseline  400' at baseline to 18' with rollator on 08/22/18    Time  4    Period  Weeks    Status  Achieved      PT SHORT TERM GOAL #5   Title  Pt will demonstrate improved sleeping position in order to indicate decreased pain at night.     Baseline  plan made 08/22/18    Time  4    Period  Weeks    Status  Partially Met      PT SHORT TERM GOAL #6   Title  Pt will report no more than 7/10 pain with functional mobility in order to indicate improved function and flexibility.    pt reports 7-9 most consistently (better from 15/10)   Time  4    Period  Weeks    Status  Partially Met        PT Long Term Goals - 09/15/18 1326      PT LONG TERM GOAL #1   Title  Pt/husband will be independent with final HEP in order to indicate improved flexibility and improved functional mobility.  (Target Date: 09/16/18)    Baseline  Is consistently doing exercises but needed cues for correct technqiue.     Time  8    Period  Weeks    Status  Achieved      PT LONG TERM GOAL #2   Title  Pt will improve 6MWT distance by 150' from STG distance with use of rollator in order to indicate improved functional endurance.     Baseline  913' at STG     Time  8    Period  Weeks    Status  Revised      PT LONG TERM GOAL #3   Title  Pt will report no more than 5/10 pain with functional mobility in order to indicate improved flexibility and function.     Baseline  pain still gets to 7/10 with mobility/standing     Time  8    Period  Weeks    Status  Not Met      PT LONG TERM GOAL #4   Title  Pt will improve gait velocity without AD to >/= 3.0 ft/sec    Baseline  3.22 ft/sec     Time  8    Period  Weeks    Status  Achieved      PT LONG TERM GOAL  #5   Title  Pt will decrease five time sit to stand without UE use and normal BOS to </= 13 seconds    Baseline  met 08/22/18, 11.85 secs on 12/16    Time  8    Period  Weeks    Status  Achieved      PT LONG TERM GOAL #6   Title  Pt will increase trunk flexion by 3-4" and trunk extension by 1-2"    Baseline  with tape measure: flexion 18" >21"; extension 18" > 17.5"    Time  8    Period  Weeks    Status  New  PT LONG TERM GOAL #7   Title  Pt will ascend/descend 8 steps without rail with cane only in step to pattern at mod I level in order to be able to attend football games safely.     Baseline  met with hurry cane 09/15/18    Time  8    Period  Weeks    Status  Achieved            Plan - 09/15/18 1515    Clinical Impression Statement  Session focused on beginning to assess LTGs.  Pt has met 4/7 LTGs.  Plan to address remaining goals on Thursday and re-cert for another 5 weeks to address remaining deficits.      Rehab Potential  Good    Clinical Impairments Affecting Rehab Potential  chronicity of deficits    PT Frequency  2x / week    PT Duration  8 weeks    PT Treatment/Interventions  ADLs/Self Care Home Management;Aquatic Therapy;Moist Heat;Ultrasound;DME Instruction;Gait training;Stair training;Functional mobility training;Therapeutic activities;Therapeutic exercise;Balance training;Neuromuscular re-education;Patient/family education;Manual techniques;Passive range of motion;Dry needling;Energy conservation;Canalith Repostioning    PT Next Visit Plan  check remaining LTGs and recert for 5 weeks, Core strength on physioball,  TDN for: low back bilat, piriformis bilat, L quad.  RE-educate use of core and pelvic stability, use of glutes for sit <> stand.  hip strengthening    PT Home Exercise Plan  [email or web address]        Patient will benefit from skilled therapeutic intervention in order to improve the following deficits and impairments:  Abnormal gait, Decreased activity  tolerance, Decreased balance, Decreased endurance, Decreased knowledge of use of DME, Decreased mobility, Decreased range of motion, Decreased strength, Difficulty walking, Hypomobility, Impaired perceived functional ability, Impaired flexibility, Postural dysfunction, Pain, Impaired sensation  Visit Diagnosis: Chronic bilateral low back pain, unspecified whether sciatica present  Muscle weakness (generalized)  Other abnormalities of gait and mobility  Abnormal posture     Problem List Patient Active Problem List   Diagnosis Date Noted  . Post menopausal problems 01/08/2012    Class: Temporary  . Diabetes mellitus 01/08/2012    Class: Chronic  . Hypertension 01/08/2012    Class: Chronic  . Anxiety 01/08/2012    Class: Chronic  . Sleep apnea 01/08/2012    Class: Chronic  . Fibromyalgia syndrome 01/08/2012    Class: Chronic  . IBS (irritable bowel syndrome) 01/08/2012    Class: Chronic  . GERD (gastroesophageal reflux disease) 01/08/2012    Class: Chronic    Cameron Sprang, PT, MPT [facility] [address] [place], [place], [address] Phone: [phone]   Fax:  [phone] 09/15/18, 3:19 PM  Name: Holly Daniels MRN: [id number] Date of Birth: August 22, 1956

## 2018-09-18 ENCOUNTER — Ambulatory Visit: Payer: BLUE CROSS/BLUE SHIELD | Admitting: Rehabilitation

## 2018-09-22 ENCOUNTER — Encounter: Payer: Self-pay | Admitting: Rehabilitation

## 2018-09-22 ENCOUNTER — Ambulatory Visit: Payer: BLUE CROSS/BLUE SHIELD | Admitting: Rehabilitation

## 2018-09-22 DIAGNOSIS — R293 Abnormal posture: Secondary | ICD-10-CM

## 2018-09-22 DIAGNOSIS — G8929 Other chronic pain: Secondary | ICD-10-CM | POA: Diagnosis not present

## 2018-09-22 DIAGNOSIS — R2689 Other abnormalities of gait and mobility: Secondary | ICD-10-CM | POA: Diagnosis not present

## 2018-09-22 DIAGNOSIS — M545 Low back pain, unspecified: Secondary | ICD-10-CM

## 2018-09-22 DIAGNOSIS — M6281 Muscle weakness (generalized): Secondary | ICD-10-CM | POA: Diagnosis not present

## 2018-09-22 NOTE — Therapy (Signed)
[facility] [address] [place], [place], [address] Phone: [phone]   Fax:  [phone]  Physical Therapy Treatment  Patient Details  Name: Holly Daniels MRN: [id number] Date of Birth: 09/24/1956 Referring Provider (PT): Latanya Presser, MD   Encounter Date: 09/22/2018  PT End of Session - 09/22/18 1325    Visit Number  11    Number of Visits  17    Date for PT Re-Evaluation  10/16/18    Authorization Type  BCBS    Authorization - Visit Number  11    Authorization - Number of Visits  90    PT Start Time  1779   only charging for 40 mins, went to restroom   PT Stop Time  1401    PT Time Calculation (min)  45 min    Activity Tolerance  Patient tolerated treatment well    Behavior During Therapy  [name] for tasks assessed/performed       Past Medical History:  Diagnosis Date  . Anxiety   . Diabetes mellitus    oral meds  . Fibromyalgia   . GERD (gastroesophageal reflux disease)   . Headache(784.0)    sinus  . History of C-section    x2  . Hypertension   . OSA (obstructive sleep apnea)     Past Surgical History:  Procedure Laterality Date  . CESAREAN SECTION     x2  . CHOLECYSTECTOMY    . CHONDROPLASTY Left 09/29/2015   Procedure: CHONDROPLASTY;  Surgeon: Frederik Pear, MD;  Location: [facility];  Service: Orthopedics;  Laterality: Left;  . HYSTEROSCOPY W/D&C  01/14/2012   Procedure: DILATATION AND CURETTAGE /HYSTEROSCOPY;  Surgeon: Delice Lesch, MD;  Location: [facility];  Service: Gynecology;  Laterality: N/A;  . KNEE ARTHROSCOPY WITH MEDIAL MENISECTOMY Left 09/29/2015   Procedure: KNEE ARTHROSCOPY WITH MEDIAL MENISECTOMY AND CHONDROPLASTY;  Surgeon: Frederik Pear, MD;  Location: [facility];  Service: Orthopedics;  Laterality: Left;  . vulvar cyst      There were no vitals filed for this visit.  Subjective Assessment - 09/22/18 1322    Subjective  "I'm feeling good today,   I took a muscle relaxer and two pain pills."     Limitations  House hold activities;Walking    How long can you sit comfortably?  as long as needed    How long can you stand comfortably?  8 mins    How long can you walk comfortably?  approx 100' before having sharp pain and feels like back "locks up."     Patient Stated Goals  "Get rid of pain, get back to doing things you want to do."     Currently in Pain?  Yes    Pain Score  4     Pain Location  Back    Pain Orientation  Right;Left;Lower    Pain Descriptors / Indicators  Aching;Burning    Pain Type  Chronic pain    Pain Radiating Towards  back down into hips and knees    Pain Onset  More than a month ago    Pain Frequency  Constant    Aggravating Factors   standing, walking, driving, weather     Pain Relieving Factors  pain medication          [facility] PT Assessment - 09/22/18 1328      6 Minute Walk- Baseline   6 Minute Walk- Baseline  yes    BP (mmHg)  138/70    HR (bpm)  91    02 Sat (%RA)  96 %    Modified Borg Scale for Dyspnea  0- Nothing at all    Perceived Rate of Exertion (Borg)  7- Very, very light      6 Minute walk- Post Test   6 Minute Walk Post Test  yes    BP (mmHg)  130/70    HR (bpm)  104    02 Sat (%RA)  978 %    Modified Borg Scale for Dyspnea  6-    Perceived Rate of Exertion (Borg)  13- Somewhat hard      6 minute walk test results    Aerobic Endurance Distance Walked  934    Endurance additional comments  single seated rest break x 2 mins with rollator                    [facility] Adult PT Treatment/Exercise - 09/22/18 0001      Ambulation/Gait   Ambulation/Gait  Yes    Ambulation/Gait Assistance  6: Modified independent (Device/Increase time)    Ambulation/Gait Assistance Details  Pt ambulatory with rollator during session.  Pt verbalizes that she uses rollator all the time and never tries to ambulate without, except for shorter distances in the house.  Encouraged her to ambulate more  indoors without AD as she is safe from balance standpoint and we are working to improve core activation and posture during mobility.  Rollator mainly for distance.  Pt verbalized understanding.     Ambulation Distance (Feet)  934 Feet   during 6MWT    Assistive device  4-wheeled walker    Gait Pattern  Step-through pattern;Decreased stride length;Trendelenburg;Lateral hip instability;Wide base of support    Ambulation Surface  Level;Indoor      Posture/Postural Control   Posture Comments  Lengthy discussion regarding standing posture and engaging core when standing for longer periods of time.  Pt tends to use trunk extension and "locks" out joints for stability however this causes increased pain.  Educated that she can also place single leg inside of bottom cabinet (altnerating every few mintues) to decrease pressure on spine while remaining good posture.  Also continue to educate on walking program as she is not currently performing at home.  Encouraged her to time herself and keep track of her progress.  Educated that she will likely need to start with 5-6 mins with rollator, working to not sit at all.  When this becomes easier (used RPE for reference as she got to 13/20 today and encouraged her to increase time when 5-6 mins is 10-11/20).  Also educated that due to short period of time, she should be performing 2-3 times per day.  Pt verbalized understanding.       Exercises   Exercises  Other Exercises    Other Exercises   Marching at counter top x 20 reps with cues for posture and engaging core.              PT Education - 09/22/18 1415    Education Details  see ambulation and self care     Person(s) Educated  Patient    Methods  Explanation    Comprehension  Verbalized understanding       PT Short Term Goals - 08/22/18 1323      PT SHORT TERM GOAL #1   Title  Pt/husband will be independent with initial HEP in order to indicate improved flexibility and  functional mobility.   (Target Date: 08/17/18)    Baseline  per verbal report 08/22/18    Time  4    Period  Weeks    Status  Achieved      PT SHORT TERM GOAL #2   Title  Pt will improve functional gait velocity to >/= 2.62 ft/sec without AD    Baseline  2.17 ft/sec to 3.30 ft/sec with rollator on 08/22/18    Time  4    Period  Weeks    Status  Achieved      PT SHORT TERM GOAL #3   Title  Pt will decrease five time sit to stand score to </=15 seconds with more normal BOS    Baseline  17.75 seconds with really wide BOS, improved to 10.97 secs without UE support 08/22/18    Time  4    Period  Weeks    Status  Achieved      PT SHORT TERM GOAL #4   Title  Will assess 6MWT with rollator and improve distance by 75' from baseline in order to indicate improved functional endurance.      Baseline  400' at baseline to 63' with rollator on 08/22/18    Time  4    Period  Weeks    Status  Achieved      PT SHORT TERM GOAL #5   Title  Pt will demonstrate improved sleeping position in order to indicate decreased pain at night.     Baseline  plan made 08/22/18    Time  4    Period  Weeks    Status  Partially Met      PT SHORT TERM GOAL #6   Title  Pt will report no more than 7/10 pain with functional mobility in order to indicate improved function and flexibility.    pt reports 7-9 most consistently (better from 15/10)   Time  4    Period  Weeks    Status  Partially Met        PT Long Term Goals - 09/22/18 1325      PT LONG TERM GOAL #1   Title  Pt/husband will be independent with final HEP in order to indicate improved flexibility and improved functional mobility.  (Target Date: 09/16/18)    Baseline  Is consistently doing exercises but needed cues for correct technqiue.     Time  8    Period  Weeks    Status  Achieved      PT LONG TERM GOAL #2   Title  Pt will improve 6MWT distance by 150' from STG distance with use of rollator in order to indicate improved functional endurance.     Baseline  913'  at STG and 934' on 09/22/18    Time  8    Period  Weeks    Status  Partially Met      PT LONG TERM GOAL #3   Title  Pt will report no more than 5/10 pain with functional mobility in order to indicate improved flexibility and function.     Baseline  pain still gets to 7/10 with mobility/standing     Time  8    Period  Weeks    Status  Not Met      PT LONG TERM GOAL #4   Title  Pt will improve gait velocity without AD to >/= 3.0 ft/sec    Baseline  3.22 ft/sec     Time  8  Period  Weeks    Status  Achieved      PT LONG TERM GOAL #5   Title  Pt will decrease five time sit to stand without UE use and normal BOS to </= 13 seconds    Baseline  met 08/22/18, 11.85 secs on 12/16    Time  8    Period  Weeks    Status  Achieved      PT LONG TERM GOAL #6   Title  Pt will increase trunk flexion by 3-4" and trunk extension by 1-2"    Baseline  with tape measure: flexion 18" >21"; extension 18" > 17.5", 18">21" flexion, 18">16" extension    Time  8    Period  Weeks    Status  Not Met      PT LONG TERM GOAL #7   Title  Pt will ascend/descend 8 steps without rail with cane only in step to pattern at mod I level in order to be able to attend football games safely.     Baseline  met with hurry cane 09/15/18    Time  8    Period  Weeks    Status  Achieved          PT Long Term Goals - 09/22/18 1325      PT LONG TERM GOAL #1   Title  Pt/husband will be independent with final HEP in order to indicate improved flexibility and improved functional mobility.  (Updated Target Date: 10/22/18)    Baseline  Is consistently doing exercises but needed cues for correct technqiue.     Time  8    Period  Weeks    Status  On-going    Target Date  10/22/18      PT LONG TERM GOAL #2   Title  Pt will improve 6MWT distance to 1084' with use of rollator in order to indicate improved functional endurance.     Baseline  913' at STG and 934' on 09/22/18    Time  8    Period  Weeks    Status  Revised       PT LONG TERM GOAL #3   Title  Pt will report no more than 5/10 pain with functional mobility in order to indicate improved flexibility and function.     Baseline  pain still gets to 7/10 with mobility/standing     Time  8    Period  Weeks    Status  On-going      PT LONG TERM GOAL #4   Title  Pt will increase trunk flexion by 3-4" in order to indicate improved flexibility and decreased pain.     Baseline  18">21" flexion, 18">16" extension on 09/22/18    Time  --    Period  --    Status  Revised      PT LONG TERM GOAL #5   Title  Pt will ascend/descend 4 steps without rail in reciprocal pattern without UE support in order to indicate improved functional strength.      Baseline  --    Time  4    Period  Weeks    Status  New      PT LONG TERM GOAL #6   Title  --    Baseline  --    Time  --    Period  --    Status  --      PT LONG TERM GOAL #7   Title  --  Baseline  --    Time  --    Period  --    Status  --          Plan - 09/22/18 1416    Clinical Impression Statement  Skilled session assessed remaining LTGs.  Pt met 4/7 LTGs, partially meeting 6MWT goal with only mild improvement in distance, however did not meet pain nor ROM goal.  Will plan to renew pt for another 4 weeks (pt missed appt last week) to address remaining deficits.      Rehab Potential  Good    Clinical Impairments Affecting Rehab Potential  chronicity of deficits    PT Frequency  2x / week    PT Duration  8 weeks    PT Treatment/Interventions  ADLs/Self Care Home Management;Aquatic Therapy;Moist Heat;Ultrasound;DME Instruction;Gait training;Stair training;Functional mobility training;Therapeutic activities;Therapeutic exercise;Balance training;Neuromuscular re-education;Patient/family education;Manual techniques;Passive range of motion;Dry needling;Energy conservation;Canalith Repostioning    PT Next Visit Plan  Audra-I put her on you again for TDN as she had reported she may want other side  done.  Core strength on physioball,  TDN for: low back bilat, piriformis bilat, L quad.  RE-educate use of core and pelvic stability, use of glutes for sit <> stand.  hip strengthening    PT Home Exercise Plan  [email or web address]        Patient will benefit from skilled therapeutic intervention in order to improve the following deficits and impairments:  Abnormal gait, Decreased activity tolerance, Decreased balance, Decreased endurance, Decreased knowledge of use of DME, Decreased mobility, Decreased range of motion, Decreased strength, Difficulty walking, Hypomobility, Impaired perceived functional ability, Impaired flexibility, Postural dysfunction, Pain, Impaired sensation  Visit Diagnosis: Chronic bilateral low back pain, unspecified whether sciatica present  Muscle weakness (generalized)  Other abnormalities of gait and mobility  Abnormal posture     Problem List Patient Active Problem List   Diagnosis Date Noted  . Post menopausal problems 01/08/2012    Class: Temporary  . Diabetes mellitus 01/08/2012    Class: Chronic  . Hypertension 01/08/2012    Class: Chronic  . Anxiety 01/08/2012    Class: Chronic  . Sleep apnea 01/08/2012    Class: Chronic  . Fibromyalgia syndrome 01/08/2012    Class: Chronic  . IBS (irritable bowel syndrome) 01/08/2012    Class: Chronic  . GERD (gastroesophageal reflux disease) 01/08/2012    Class: Chronic    Cameron Sprang, PT, MPT [facility] [address] [place], [place], [address] Phone: [phone]   Fax:  [phone] 09/22/18, 2:25 PM  Name: Holly Daniels MRN: [id number] Date of Birth: [birth year]

## 2018-09-26 ENCOUNTER — Ambulatory Visit: Payer: BLUE CROSS/BLUE SHIELD | Admitting: Rehabilitation

## 2018-09-26 DIAGNOSIS — H40003 Preglaucoma, unspecified, bilateral: Secondary | ICD-10-CM | POA: Diagnosis not present

## 2018-09-26 DIAGNOSIS — H25093 Other age-related incipient cataract, bilateral: Secondary | ICD-10-CM | POA: Diagnosis not present

## 2018-09-29 ENCOUNTER — Ambulatory Visit: Payer: BLUE CROSS/BLUE SHIELD | Admitting: Rehabilitation

## 2018-09-29 ENCOUNTER — Encounter: Payer: Self-pay | Admitting: Rehabilitation

## 2018-09-29 DIAGNOSIS — R2689 Other abnormalities of gait and mobility: Secondary | ICD-10-CM | POA: Diagnosis not present

## 2018-09-29 DIAGNOSIS — M6281 Muscle weakness (generalized): Secondary | ICD-10-CM

## 2018-09-29 DIAGNOSIS — M545 Low back pain, unspecified: Secondary | ICD-10-CM

## 2018-09-29 DIAGNOSIS — G8929 Other chronic pain: Secondary | ICD-10-CM | POA: Diagnosis not present

## 2018-09-29 DIAGNOSIS — R293 Abnormal posture: Secondary | ICD-10-CM | POA: Diagnosis not present

## 2018-09-29 NOTE — Therapy (Signed)
[facility] [address] [place], [place], [address] Phone: [phone]   Fax:  [phone]  Physical Therapy Treatment  Patient Details  Name: Holly Daniels MRN: [id number] Date of Birth: July 23, 1956 Referring Provider (PT): Latanya Presser, MD   Encounter Date: 09/29/2018  PT End of Session - 09/29/18 1322    Visit Number  12    Number of Visits  19    Date for PT Re-Evaluation  10/22/18    Authorization Type  [employer]    Authorization - Visit Number  12   can update in new year if her count starts over in Jan   Authorization - Number of Visits  90    PT Start Time  1317    PT Stop Time  1401    PT Time Calculation (min)  44 min    Activity Tolerance  Patient tolerated treatment well    Behavior During Therapy  [facility] for tasks assessed/performed       Past Medical History:  Diagnosis Date  . Anxiety   . Diabetes mellitus    oral meds  . Fibromyalgia   . GERD (gastroesophageal reflux disease)   . Headache(784.0)    sinus  . History of C-section    x2  . Hypertension   . OSA (obstructive sleep apnea)     Past Surgical History:  Procedure Laterality Date  . CESAREAN SECTION     x2  . CHOLECYSTECTOMY    . CHONDROPLASTY Left 09/29/2015   Procedure: CHONDROPLASTY;  Surgeon: Frederik Pear, MD;  Location: [facility];  Service: Orthopedics;  Laterality: Left;  . HYSTEROSCOPY W/D&C  01/14/2012   Procedure: DILATATION AND CURETTAGE /HYSTEROSCOPY;  Surgeon: Delice Lesch, MD;  Location: [facility];  Service: Gynecology;  Laterality: N/A;  . KNEE ARTHROSCOPY WITH MEDIAL MENISECTOMY Left 09/29/2015   Procedure: KNEE ARTHROSCOPY WITH MEDIAL MENISECTOMY AND CHONDROPLASTY;  Surgeon: Frederik Pear, MD;  Location: [facility];  Service: Orthopedics;  Laterality: Left;  . vulvar cyst      There were no vitals filed for this visit.  Subjective Assessment - 09/29/18 1320    Subjective  "I'm doing  good today."    Patient is accompained by:  Family member    Limitations  House hold activities;Walking    How long can you sit comfortably?  as long as needed    How long can you stand comfortably?  8 mins    How long can you walk comfortably?  approx 100' before having sharp pain and feels like back "locks up."     Patient Stated Goals  "Get rid of pain, get back to doing things you want to do."     Currently in Pain?  Yes    Pain Score  3     Pain Location  Back    Pain Orientation  Right    Pain Descriptors / Indicators  Aching    Pain Type  Chronic pain    Pain Onset  More than a month ago    Pain Frequency  Constant    Aggravating Factors   standing/walking     Pain Relieving Factors  pain medication                        [facility] Adult PT Treatment/Exercise - 09/29/18 1329      Ambulation/Gait   Ambulation/Gait  Yes    Ambulation/Gait Assistance  5: Supervision;4: Min guard  Ambulation/Gait Assistance Details  Had pt ambulate over unlevel outdoor paved sufaces during session with quad tip cane (hurry cane unavailable) in order to address quality, improved core activation and endurance.  Pt extremely fatigued following 500' with increased back pain. Provided cues throughout for core activation (decreased anterior pelvic tilt) as well as sequencing for cane.  Pt tends to place cane too far ahead causing her to tip forward and then lean onto cane.      Ambulation Distance (Feet)  500 Feet    Assistive device  Straight cane   with quad tip   Gait Pattern  Step-through pattern;Decreased stride length;Trendelenburg;Lateral hip instability;Wide base of support    Ambulation Surface  Level;Indoor      Exercises   Exercises  Other Exercises    Other Exercises   quadruped fire hydrant x 5 reps on each side, tall kneeling squats x 10 reps against red band, quadruped trunk rotation reaching under and upward x 5 reps on each side. Quadruped alternating UE/LE x 5 reps on  each side. Seated on green physioball performing alternating marching x 10 reps total, LAQx 10 reps on each side, shoulder extension x 10 reps with red theraband, seated scapular retraction x 10 reps with red band.  Seated hamstring stretch at end of session x 45 secs on each side.              PT Education - 09/29/18 1424    Education Details  continuing to perform stretches tomorrrow and next day especially due to likely soreness from exercise today    Person(s) Educated  Patient    Methods  Explanation    Comprehension  Verbalized understanding       PT Short Term Goals - 08/22/18 1323      PT SHORT TERM GOAL #1   Title  Pt/husband will be independent with initial HEP in order to indicate improved flexibility and functional mobility.  (Target Date: 08/17/18)    Baseline  per verbal report 08/22/18    Time  4    Period  Weeks    Status  Achieved      PT SHORT TERM GOAL #2   Title  Pt will improve functional gait velocity to >/= 2.62 ft/sec without AD    Baseline  2.17 ft/sec to 3.30 ft/sec with rollator on 08/22/18    Time  4    Period  Weeks    Status  Achieved      PT SHORT TERM GOAL #3   Title  Pt will decrease five time sit to stand score to </=15 seconds with more normal BOS    Baseline  17.75 seconds with really wide BOS, improved to 10.97 secs without UE support 08/22/18    Time  4    Period  Weeks    Status  Achieved      PT SHORT TERM GOAL #4   Title  Will assess 6MWT with rollator and improve distance by 75' from baseline in order to indicate improved functional endurance.      Baseline  400' at baseline to 80' with rollator on 08/22/18    Time  4    Period  Weeks    Status  Achieved      PT SHORT TERM GOAL #5   Title  Pt will demonstrate improved sleeping position in order to indicate decreased pain at night.     Baseline  plan made 08/22/18    Time  4  Period  Weeks    Status  Partially Met      PT SHORT TERM GOAL #6   Title  Pt will report no  more than 7/10 pain with functional mobility in order to indicate improved function and flexibility.    pt reports 7-9 most consistently (better from 15/10)   Time  4    Period  Weeks    Status  Partially Met        PT Long Term Goals - 09/22/18 1325      PT LONG TERM GOAL #1   Title  Pt/husband will be independent with final HEP in order to indicate improved flexibility and improved functional mobility.  (Updated Target Date: 10/22/18)    Baseline  Is consistently doing exercises but needed cues for correct technqiue.     Time  8    Period  Weeks    Status  On-going    Target Date  10/22/18      PT LONG TERM GOAL #2   Title  Pt will improve 6MWT distance to 1084' with use of rollator in order to indicate improved functional endurance.     Baseline  913' at STG and 934' on 09/22/18    Time  8    Period  Weeks    Status  Revised      PT LONG TERM GOAL #3   Title  Pt will report no more than 5/10 pain with functional mobility in order to indicate improved flexibility and function.     Baseline  pain still gets to 7/10 with mobility/standing     Time  8    Period  Weeks    Status  On-going      PT LONG TERM GOAL #4   Title  Pt will increase trunk flexion by 3-4" in order to indicate improved flexibility and decreased pain.     Baseline  18">21" flexion, 18">16" extension on 09/22/18    Time  --    Period  --    Status  Revised      PT LONG TERM GOAL #5   Title  Pt will ascend/descend 4 steps without rail in reciprocal pattern without UE support in order to indicate improved functional strength.      Baseline  --    Time  4    Period  Weeks    Status  New      PT LONG TERM GOAL #6   Title  --    Baseline  --    Time  --    Period  --    Status  --      PT LONG TERM GOAL #7   Title  --    Baseline  --    Time  --    Period  --    Status  --            Plan - 09/29/18 1425    Clinical Impression Statement  Skilled session continues to focus on core  strengthening/stabilization with therex and also during gait/mobility.  Pt making progress with therex however has difficulty carrying over to gait and mobility.      Rehab Potential  Good    Clinical Impairments Affecting Rehab Potential  chronicity of deficits    PT Frequency  2x / week    PT Duration  4 weeks    PT Treatment/Interventions  ADLs/Self Care Home Management;Aquatic Therapy;Moist Heat;Ultrasound;DME Instruction;Gait training;Stair training;Functional mobility training;Therapeutic activities;Therapeutic exercise;Balance training;Neuromuscular  re-education;Patient/family education;Manual techniques;Passive range of motion;Dry needling;Energy conservation;Canalith Repostioning    PT Next Visit Plan  Audra-I put her on you again for TDN as she had reported she may want other side done (or any areas that you think).  Core strength on physioball,  TDN for: low back bilat, piriformis bilat, L quad.  RE-educate use of core and pelvic stability, use of glutes for sit <> stand.  hip strengthening    PT Home Exercise Plan  [id number]        Patient will benefit from skilled therapeutic intervention in order to improve the following deficits and impairments:  Abnormal gait, Decreased activity tolerance, Decreased balance, Decreased endurance, Decreased knowledge of use of DME, Decreased mobility, Decreased range of motion, Decreased strength, Difficulty walking, Hypomobility, Impaired perceived functional ability, Impaired flexibility, Postural dysfunction, Pain, Impaired sensation  Visit Diagnosis: Chronic bilateral low back pain, unspecified whether sciatica present  Muscle weakness (generalized)  Other abnormalities of gait and mobility  Abnormal posture     Problem List Patient Active Problem List   Diagnosis Date Noted  . Post menopausal problems 01/08/2012    Class: Temporary  . Diabetes mellitus 01/08/2012    Class: Chronic  . Hypertension 01/08/2012    Class: Chronic  .  Anxiety 01/08/2012    Class: Chronic  . Sleep apnea 01/08/2012    Class: Chronic  . Fibromyalgia syndrome 01/08/2012    Class: Chronic  . IBS (irritable bowel syndrome) 01/08/2012    Class: Chronic  . GERD (gastroesophageal reflux disease) 01/08/2012    Class: Chronic    Cameron Sprang, PT, MPT [facility] [address] [place], [place], [address] Phone: [phone]   Fax:  [phone] 09/29/18, 2:28 PM  Name: Holly Daniels MRN: [id number] Date of Birth: 22-Sep-1956

## 2018-10-03 ENCOUNTER — Encounter: Payer: Self-pay | Admitting: Physical Therapy

## 2018-10-03 ENCOUNTER — Ambulatory Visit: Payer: BLUE CROSS/BLUE SHIELD | Attending: Cardiology | Admitting: Physical Therapy

## 2018-10-03 DIAGNOSIS — R2689 Other abnormalities of gait and mobility: Secondary | ICD-10-CM

## 2018-10-03 DIAGNOSIS — R293 Abnormal posture: Secondary | ICD-10-CM | POA: Diagnosis not present

## 2018-10-03 DIAGNOSIS — G8929 Other chronic pain: Secondary | ICD-10-CM

## 2018-10-03 DIAGNOSIS — M6281 Muscle weakness (generalized): Secondary | ICD-10-CM | POA: Diagnosis not present

## 2018-10-03 DIAGNOSIS — M545 Low back pain, unspecified: Secondary | ICD-10-CM

## 2018-10-03 NOTE — Therapy (Signed)
[facility] [address] [place], [place], [address] Phone: [phone]   Fax:  [phone]  Physical Therapy Treatment  Patient Details  Name: Holly Daniels MRN: [id number] Date of Birth: [birth year] Referring Provider (PT): Latanya Presser, MD   Encounter Date: 10/03/2018  PT End of Session - 10/03/18 1332    Visit Number  13    Number of Visits  19    Date for PT Re-Evaluation  10/22/18    Authorization Type  [employer]    Authorization - Visit Number  13   can update in new year if her count starts over in Jan   Authorization - Number of Visits  90    PT Start Time  1103    PT Stop Time  1148    PT Time Calculation (min)  45 min    Activity Tolerance  Patient tolerated treatment well    Behavior During Therapy  [facility] for tasks assessed/performed       Past Medical History:  Diagnosis Date  . Anxiety   . Diabetes mellitus    oral meds  . Fibromyalgia   . GERD (gastroesophageal reflux disease)   . Headache(784.0)    sinus  . History of C-section    x2  . Hypertension   . OSA (obstructive sleep apnea)     Past Surgical History:  Procedure Laterality Date  . CESAREAN SECTION     x2  . CHOLECYSTECTOMY    . CHONDROPLASTY Left 09/29/2015   Procedure: CHONDROPLASTY;  Surgeon: Frederik Pear, MD;  Location: [facility];  Service: Orthopedics;  Laterality: Left;  . HYSTEROSCOPY W/D&C  01/14/2012   Procedure: DILATATION AND CURETTAGE /HYSTEROSCOPY;  Surgeon: Delice Lesch, MD;  Location: [facility];  Service: Gynecology;  Laterality: N/A;  . KNEE ARTHROSCOPY WITH MEDIAL MENISECTOMY Left 09/29/2015   Procedure: KNEE ARTHROSCOPY WITH MEDIAL MENISECTOMY AND CHONDROPLASTY;  Surgeon: Frederik Pear, MD;  Location: [facility];  Service: Orthopedics;  Laterality: Left;  . vulvar cyst      There were no vitals filed for this visit.  Subjective Assessment - 10/03/18 1112    Subjective  Doing good  today; had a couple days of soreness after previous TDN but then it eased off.  Feels like the L side really needs it.    Patient is accompained by:  Family member    Limitations  House hold activities;Walking    How long can you sit comfortably?  as long as needed    How long can you stand comfortably?  8 mins    How long can you walk comfortably?  approx 100' before having sharp pain and feels like back "locks up."     Patient Stated Goals  "Get rid of pain, get back to doing things you want to do."     Currently in Pain?  Yes    Pain Score  5     Pain Location  Hip    Pain Orientation  Left;Posterior    Pain Descriptors / Indicators  Sore;Aching;Cramping    Pain Onset  More than a month ago                       [facility] Adult PT Treatment/Exercise - 10/03/18 1330      Knee/Hip Exercises: Stretches   Active Hamstring Stretch  Left    Active Hamstring Stretch Limitations  x 30 seconds    Piriformis Stretch  Both;2  reps;30 seconds    Piriformis Stretch Limitations  supine, figure 4 stretch      Manual Therapy   Manual Therapy  Soft tissue mobilization    Soft tissue mobilization  STM to L piriformis with deep pressure to trigger points       Trigger Point Dry Needling - 10/03/18 1114    Consent Given?  Yes    Muscles Treated Lower Body  Piriformis;Hamstring    Piriformis Response  Twitch response elicited    Hamstring Response  Twitch response elicited           PT Education - 10/03/18 1331    Education Details  continue to stretch tomorrow and next day to counteract soreness    Person(s) Educated  Patient    Methods  Explanation    Comprehension  Verbalized understanding;Returned demonstration       PT Short Term Goals - 08/22/18 1323      PT SHORT TERM GOAL #1   Title  Pt/husband will be independent with initial HEP in order to indicate improved flexibility and functional mobility.  (Target Date: 08/17/18)    Baseline  per verbal report 08/22/18     Time  4    Period  Weeks    Status  Achieved      PT SHORT TERM GOAL #2   Title  Pt will improve functional gait velocity to >/= 2.62 ft/sec without AD    Baseline  2.17 ft/sec to 3.30 ft/sec with rollator on 08/22/18    Time  4    Period  Weeks    Status  Achieved      PT SHORT TERM GOAL #3   Title  Pt will decrease five time sit to stand score to </=15 seconds with more normal BOS    Baseline  17.75 seconds with really wide BOS, improved to 10.97 secs without UE support 08/22/18    Time  4    Period  Weeks    Status  Achieved      PT SHORT TERM GOAL #4   Title  Will assess 6MWT with rollator and improve distance by 75' from baseline in order to indicate improved functional endurance.      Baseline  400' at baseline to 82' with rollator on 08/22/18    Time  4    Period  Weeks    Status  Achieved      PT SHORT TERM GOAL #5   Title  Pt will demonstrate improved sleeping position in order to indicate decreased pain at night.     Baseline  plan made 08/22/18    Time  4    Period  Weeks    Status  Partially Met      PT SHORT TERM GOAL #6   Title  Pt will report no more than 7/10 pain with functional mobility in order to indicate improved function and flexibility.    pt reports 7-9 most consistently (better from 15/10)   Time  4    Period  Weeks    Status  Partially Met        PT Long Term Goals - 09/22/18 1325      PT LONG TERM GOAL #1   Title  Pt/husband will be independent with final HEP in order to indicate improved flexibility and improved functional mobility.  (Updated Target Date: 10/22/18)    Baseline  Is consistently doing exercises but needed cues for correct technqiue.     Time  8    Period  Weeks    Status  On-going    Target Date  10/22/18      PT LONG TERM GOAL #2   Title  Pt will improve 6MWT distance to 1084' with use of rollator in order to indicate improved functional endurance.     Baseline  913' at STG and 934' on 09/22/18    Time  8    Period   Weeks    Status  Revised      PT LONG TERM GOAL #3   Title  Pt will report no more than 5/10 pain with functional mobility in order to indicate improved flexibility and function.     Baseline  pain still gets to 7/10 with mobility/standing     Time  8    Period  Weeks    Status  On-going      PT LONG TERM GOAL #4   Title  Pt will increase trunk flexion by 3-4" in order to indicate improved flexibility and decreased pain.     Baseline  18">21" flexion, 18">16" extension on 09/22/18    Time  --    Period  --    Status  Revised      PT LONG TERM GOAL #5   Title  Pt will ascend/descend 4 steps without rail in reciprocal pattern without UE support in order to indicate improved functional strength.      Baseline  --    Time  4    Period  Weeks    Status  New      PT LONG TERM GOAL #6   Title  --    Baseline  --    Time  --    Period  --    Status  --      PT LONG TERM GOAL #7   Title  --    Baseline  --    Time  --    Period  --    Status  --            Plan - 10/03/18 1332    Clinical Impression Statement  Treatment session focused on TDN for treatment of active trigger points in L piriformis and hamstring muscle groups and soft tissue mobilization to increase ROM and decrease pain.  Pt reporting decrease in pain at end of session.      Rehab Potential  Good    Clinical Impairments Affecting Rehab Potential  chronicity of deficits    PT Frequency  2x / week    PT Duration  4 weeks    PT Treatment/Interventions  ADLs/Self Care Home Management;Aquatic Therapy;Moist Heat;Ultrasound;DME Instruction;Gait training;Stair training;Functional mobility training;Therapeutic activities;Therapeutic exercise;Balance training;Neuromuscular re-education;Patient/family education;Manual techniques;Passive range of motion;Dry needling;Energy conservation;Canalith Repostioning    PT Next Visit Plan  How did she tolerate the piriformis and HS dry needling on L side?  Core strength on  physioball,  TDN for: low back bilat, piriformis bilat, L quad.  RE-educate use of core and pelvic stability, use of glutes for sit <> stand.  hip strengthening    PT Home Exercise Plan  [id number]     Consulted and Agree with Plan of Care  Patient       Patient will benefit from skilled therapeutic intervention in order to improve the following deficits and impairments:  Abnormal gait, Decreased activity tolerance, Decreased balance, Decreased endurance, Decreased knowledge of use of DME, Decreased mobility, Decreased range of motion, Decreased strength, Difficulty walking, Hypomobility,  Impaired perceived functional ability, Impaired flexibility, Postural dysfunction, Pain, Impaired sensation  Visit Diagnosis: Chronic bilateral low back pain, unspecified whether sciatica present  Muscle weakness (generalized)  Other abnormalities of gait and mobility     Problem List Patient Active Problem List   Diagnosis Date Noted  . Post menopausal problems 01/08/2012    Class: Temporary  . Diabetes mellitus 01/08/2012    Class: Chronic  . Hypertension 01/08/2012    Class: Chronic  . Anxiety 01/08/2012    Class: Chronic  . Sleep apnea 01/08/2012    Class: Chronic  . Fibromyalgia syndrome 01/08/2012    Class: Chronic  . IBS (irritable bowel syndrome) 01/08/2012    Class: Chronic  . GERD (gastroesophageal reflux disease) 01/08/2012    Class: Chronic    Rico Junker, PT, DPT 10/03/18    1:40 PM    [facility] [address] [place], [place], [address] Phone: [phone]   Fax:  [phone]  Name: Abbye Lao MRN: [id number] Date of Birth: 03-Oct-1955

## 2018-10-10 ENCOUNTER — Ambulatory Visit: Payer: BLUE CROSS/BLUE SHIELD | Admitting: Rehabilitation

## 2018-10-13 ENCOUNTER — Ambulatory Visit: Payer: BLUE CROSS/BLUE SHIELD | Admitting: Rehabilitation

## 2018-10-16 DIAGNOSIS — H40053 Ocular hypertension, bilateral: Secondary | ICD-10-CM | POA: Diagnosis not present

## 2018-10-17 ENCOUNTER — Ambulatory Visit: Payer: BLUE CROSS/BLUE SHIELD | Admitting: Rehabilitation

## 2018-10-17 ENCOUNTER — Encounter: Payer: Self-pay | Admitting: Rehabilitation

## 2018-10-17 DIAGNOSIS — G8929 Other chronic pain: Secondary | ICD-10-CM | POA: Diagnosis not present

## 2018-10-17 DIAGNOSIS — R293 Abnormal posture: Secondary | ICD-10-CM | POA: Diagnosis not present

## 2018-10-17 DIAGNOSIS — M6281 Muscle weakness (generalized): Secondary | ICD-10-CM | POA: Diagnosis not present

## 2018-10-17 DIAGNOSIS — M545 Low back pain, unspecified: Secondary | ICD-10-CM

## 2018-10-17 DIAGNOSIS — R2689 Other abnormalities of gait and mobility: Secondary | ICD-10-CM | POA: Diagnosis not present

## 2018-10-17 NOTE — Therapy (Signed)
[facility] [address] [place], [place], [address] Phone: [phone]   Fax:  [phone]  Physical Therapy Treatment  Patient Details  Name: Holly Daniels MRN: [id number] Date of Birth: [birth year] Referring Provider (PT): Latanya Presser, MD   Encounter Date: 10/17/2018  PT End of Session - 10/17/18 1322    Visit Number  14    Number of Visits  19    Date for PT Re-Evaluation  10/22/18    Authorization Type  [employer]    Authorization - Visit Number  14   can update in new year if her count starts over in Jan   Authorization - Number of Visits  90    PT Start Time  1316    PT Stop Time  1400    PT Time Calculation (min)  44 min    Activity Tolerance  Patient tolerated treatment well    Behavior During Therapy  [facility] for tasks assessed/performed       Past Medical History:  Diagnosis Date  . Anxiety   . Diabetes mellitus    oral meds  . Fibromyalgia   . GERD (gastroesophageal reflux disease)   . Headache(784.0)    sinus  . History of C-section    x2  . Hypertension   . OSA (obstructive sleep apnea)     Past Surgical History:  Procedure Laterality Date  . CESAREAN SECTION     x2  . CHOLECYSTECTOMY    . CHONDROPLASTY Left 09/29/2015   Procedure: CHONDROPLASTY;  Surgeon: Frederik Pear, MD;  Location: [facility];  Service: Orthopedics;  Laterality: Left;  . HYSTEROSCOPY W/D&C  01/14/2012   Procedure: DILATATION AND CURETTAGE /HYSTEROSCOPY;  Surgeon: Delice Lesch, MD;  Location: [facility];  Service: Gynecology;  Laterality: N/A;  . KNEE ARTHROSCOPY WITH MEDIAL MENISECTOMY Left 09/29/2015   Procedure: KNEE ARTHROSCOPY WITH MEDIAL MENISECTOMY AND CHONDROPLASTY;  Surgeon: Frederik Pear, MD;  Location: [facility];  Service: Orthopedics;  Laterality: Left;  . vulvar cyst      There were no vitals filed for this visit.  Subjective Assessment - 10/17/18 1320    Subjective  "I'm feeling  okay today considering I have been running around trying to take care of my dad, my husband and my husband's sister is in the hospital. "    Patient is accompained by:  Family member    Limitations  House hold activities;Walking    How long can you sit comfortably?  as long as needed    How long can you stand comfortably?  8 mins    How long can you walk comfortably?  approx 100' before having sharp pain and feels like back "locks up."     Patient Stated Goals  "Get rid of pain, get back to doing things you want to do."     Currently in Pain?  Yes    Pain Score  3    7 following quadruped   Pain Location  Hip    Pain Orientation  Right    Pain Descriptors / Indicators  Sharp    Pain Type  Chronic pain    Pain Onset  More than a month ago    Pain Frequency  Constant    Aggravating Factors   standing/walking     Pain Relieving Factors  pain medication          [facility] PT Assessment - 10/17/18 1328      6 Minute Walk-  Baseline   6 Minute Walk- Baseline  yes    BP (mmHg)  124/66    HR (bpm)  90    02 Sat (%RA)  95 %    Modified Borg Scale for Dyspnea  2- Mild shortness of breath    Perceived Rate of Exertion (Borg)  7- Very, very light      6 Minute walk- Post Test   BP (mmHg)  132/72    HR (bpm)  103    02 Sat (%RA)  94 %    Modified Borg Scale for Dyspnea  4- somewhat severe    Perceived Rate of Exertion (Borg)  13- Somewhat hard      6 minute walk test results    Aerobic Endurance Distance Walked  1038    Endurance additional comments  with single seated rest break x 45 secs                  [facility] Adult PT Treatment/Exercise - 10/17/18 1341      Self-Care   Self-Care  Other Self-Care Comments    Other Self-Care Comments   Discussed POC during session and pt initially seems to want to D/C at next visit, however following session reports that she feels she would like to continue.  Pt making slow by steady progress, therefore feel she would benefit from continued  PT.  Will plan to add 2x/wk for 4 more weeks following next session.  Continue to feel that she has marked muscle imbalances and tends to not activate core during gait causing increased pain and tightness. Pt verbalized understanding.       Exercises   Exercises  Other Exercises    Other Exercises   Supine R hip IR/ER stretching x 15 secs each.  Note increased pain with both motions with pt reporting catching in R hip.  Attempted to perform muscle energy technique to further assess if SI joint may be causing pain.  Performed isometric R hip extension/L hip flex x 5 reps with 3-4 sec hold with cues to contract within pain free range.  B isometric hip abd x 5 rpes with 5 sec holds progressing to adding bridging x 5 reps.  Following 6MWT attempted quadruped alternating LE extension followed by fire hydrant/hip flex, however pt unable to tolerate due to increased pain.  Ended session with standing forward stretch over table top with LEs maintaining in extension.               PT Education - 10/17/18 1517    Education Details  see self care, education on progress with 6MWT    Person(s) Educated  Patient    Methods  Explanation    Comprehension  Verbalized understanding       PT Short Term Goals - 08/22/18 1323      PT SHORT TERM GOAL #1   Title  Pt/husband will be independent with initial HEP in order to indicate improved flexibility and functional mobility.  (Target Date: 08/17/18)    Baseline  per verbal report 08/22/18    Time  4    Period  Weeks    Status  Achieved      PT SHORT TERM GOAL #2   Title  Pt will improve functional gait velocity to >/= 2.62 ft/sec without AD    Baseline  2.17 ft/sec to 3.30 ft/sec with rollator on 08/22/18    Time  4    Period  Weeks    Status  Achieved  PT SHORT TERM GOAL #3   Title  Pt will decrease five time sit to stand score to </=15 seconds with more normal BOS    Baseline  17.75 seconds with really wide BOS, improved to 10.97 secs without  UE support 08/22/18    Time  4    Period  Weeks    Status  Achieved      PT SHORT TERM GOAL #4   Title  Will assess 6MWT with rollator and improve distance by 75' from baseline in order to indicate improved functional endurance.      Baseline  400' at baseline to 67' with rollator on 08/22/18    Time  4    Period  Weeks    Status  Achieved      PT SHORT TERM GOAL #5   Title  Pt will demonstrate improved sleeping position in order to indicate decreased pain at night.     Baseline  plan made 08/22/18    Time  4    Period  Weeks    Status  Partially Met      PT SHORT TERM GOAL #6   Title  Pt will report no more than 7/10 pain with functional mobility in order to indicate improved function and flexibility.    pt reports 7-9 most consistently (better from 15/10)   Time  4    Period  Weeks    Status  Partially Met        PT Long Term Goals - 10/17/18 1352      PT LONG TERM GOAL #1   Title  Pt/husband will be independent with final HEP in order to indicate improved flexibility and improved functional mobility.  (Updated Target Date: 10/22/18)    Baseline  Is consistently doing exercises but needed cues for correct technqiue.     Time  8    Period  Weeks    Status  On-going      PT LONG TERM GOAL #2   Title  Pt will improve 6MWT distance to 1084' with use of rollator in order to indicate improved functional endurance.     Baseline  913' at STG and 934' on 09/22/18 to 1038' on 10/17/18    Time  8    Period  Weeks    Status  Partially Met      PT LONG TERM GOAL #3   Title  Pt will report no more than 5/10 pain with functional mobility in order to indicate improved flexibility and function.     Baseline  pain still gets to 7/10 with mobility/standing     Time  8    Period  Weeks    Status  On-going      PT LONG TERM GOAL #4   Title  Pt will increase trunk flexion by 3-4" in order to indicate improved flexibility and decreased pain.     Baseline  18">21" flexion, 18">16"  extension on 09/22/18    Status  Revised      PT LONG TERM GOAL #5   Title  Pt will ascend/descend 4 steps without rail in reciprocal pattern without UE support in order to indicate improved functional strength.      Time  4    Period  Weeks    Status  New            Plan - 10/17/18 1325    Clinical Impression Statement  Skilled session focused on discussion of current POC and eventually  wanting to add another 4 weeks to work towards remaining goals.  Discussed that PT would expect for her to join community fitness program to continue to progress.  She is currently a member of senior center, but doesn't attend.  Will add another 4 weeks to POC following next session to address deficits.     Rehab Potential  Good    Clinical Impairments Affecting Rehab Potential  chronicity of deficits    PT Frequency  2x / week    PT Duration  4 weeks    PT Treatment/Interventions  ADLs/Self Care Home Management;Aquatic Therapy;Moist Heat;Ultrasound;DME Instruction;Gait training;Stair training;Functional mobility training;Therapeutic activities;Therapeutic exercise;Balance training;Neuromuscular re-education;Patient/family education;Manual techniques;Passive range of motion;Dry needling;Energy conservation;Canalith Repostioning    PT Next Visit Plan  Dry needling as able, Core strength on physioball,  TDN for: low back bilat, piriformis bilat, L quad.  RE-educate use of core and pelvic stability, use of glutes for sit <> stand.  hip strengthening    PT Home Exercise Plan  [id number]     Consulted and Agree with Plan of Care  Patient       Patient will benefit from skilled therapeutic intervention in order to improve the following deficits and impairments:  Abnormal gait, Decreased activity tolerance, Decreased balance, Decreased endurance, Decreased knowledge of use of DME, Decreased mobility, Decreased range of motion, Decreased strength, Difficulty walking, Hypomobility, Impaired perceived functional  ability, Impaired flexibility, Postural dysfunction, Pain, Impaired sensation  Visit Diagnosis: Chronic bilateral low back pain, unspecified whether sciatica present  Muscle weakness (generalized)  Other abnormalities of gait and mobility  Abnormal posture     Problem List Patient Active Problem List   Diagnosis Date Noted  . Post menopausal problems 01/08/2012    Class: Temporary  . Diabetes mellitus 01/08/2012    Class: Chronic  . Hypertension 01/08/2012    Class: Chronic  . Anxiety 01/08/2012    Class: Chronic  . Sleep apnea 01/08/2012    Class: Chronic  . Fibromyalgia syndrome 01/08/2012    Class: Chronic  . IBS (irritable bowel syndrome) 01/08/2012    Class: Chronic  . GERD (gastroesophageal reflux disease) 01/08/2012    Class: Chronic    Cameron Sprang, PT, MPT [facility] [address] [place], [place], [address] Phone: [phone]   Fax:  [phone] 10/17/18, 3:20 PM  Name: Lyndsee Casa MRN: [id number] Date of Birth: 1956-09-17

## 2018-10-20 ENCOUNTER — Ambulatory Visit: Payer: BLUE CROSS/BLUE SHIELD | Admitting: Rehabilitation

## 2018-10-20 NOTE — Therapy (Deleted)
[facility] [address] [place], [place], [address] Phone: [phone]   Fax:  [phone]  Patient Details  Name: Holly Daniels MRN: [id number] Date of Birth: 01-28-1956 Referring Provider:  Audley Hose, MD  Encounter Date: 10/20/2018   Pt called to clinic today reporting that she has many family issues going on right now and would like to put PT on hold.  Will place on hold and re-assess upon return.     Cameron Sprang, PT, MPT [facility] [address] [place], [place], [address] Phone: [phone]   Fax:  [phone] 10/20/18, 12:43 PM

## 2018-10-20 NOTE — Therapy (Signed)
[facility] [address] [place], [place], [address] Phone: [phone]   Fax:  [phone]  Patient Details  Name: Jonni Oelkers MRN: [id number] Date of Birth: 08/05/1956 Referring Provider:  Audley Hose, MD  Encounter Date: 10/20/2018   Pt called to report she is having multiple family medical issues right now and would like to be placed on hold until these can be resolved.  PT to place pt on hold and re-assess goals upon return for re-cert.    Note that encounter opened in error and therefore a charge of Arrive/cancel was placed in chart.     Cameron Sprang, PT, MPT [facility] [address] [place], [place], [address] Phone: [phone]   Fax:  [phone] 10/20/18, 12:51 PM

## 2018-10-23 ENCOUNTER — Ambulatory Visit: Payer: BLUE CROSS/BLUE SHIELD | Admitting: Rehabilitation

## 2018-10-31 ENCOUNTER — Ambulatory Visit: Payer: BLUE CROSS/BLUE SHIELD | Admitting: Physical Therapy

## 2018-10-31 ENCOUNTER — Encounter

## 2018-11-04 ENCOUNTER — Ambulatory Visit: Payer: BLUE CROSS/BLUE SHIELD | Admitting: Rehabilitation

## 2018-11-07 ENCOUNTER — Ambulatory Visit: Payer: BLUE CROSS/BLUE SHIELD | Admitting: Physical Therapy

## 2018-11-07 ENCOUNTER — Encounter: Payer: BLUE CROSS/BLUE SHIELD | Admitting: Obstetrics and Gynecology

## 2018-11-11 ENCOUNTER — Ambulatory Visit: Payer: BLUE CROSS/BLUE SHIELD | Admitting: Rehabilitation

## 2018-11-14 ENCOUNTER — Ambulatory Visit: Payer: BLUE CROSS/BLUE SHIELD | Admitting: Physical Therapy

## 2018-11-18 ENCOUNTER — Ambulatory Visit: Payer: BLUE CROSS/BLUE SHIELD | Admitting: Rehabilitation

## 2018-11-21 ENCOUNTER — Ambulatory Visit: Payer: BLUE CROSS/BLUE SHIELD | Admitting: Rehabilitation

## 2019-02-02 DIAGNOSIS — E669 Obesity, unspecified: Secondary | ICD-10-CM | POA: Diagnosis not present

## 2019-02-02 DIAGNOSIS — E782 Mixed hyperlipidemia: Secondary | ICD-10-CM | POA: Diagnosis not present

## 2019-02-02 DIAGNOSIS — E1165 Type 2 diabetes mellitus with hyperglycemia: Secondary | ICD-10-CM | POA: Diagnosis not present

## 2019-02-02 DIAGNOSIS — I10 Essential (primary) hypertension: Secondary | ICD-10-CM | POA: Diagnosis not present

## 2019-03-24 DIAGNOSIS — E1165 Type 2 diabetes mellitus with hyperglycemia: Secondary | ICD-10-CM | POA: Diagnosis not present

## 2019-03-24 DIAGNOSIS — E782 Mixed hyperlipidemia: Secondary | ICD-10-CM | POA: Diagnosis not present

## 2019-03-24 DIAGNOSIS — E669 Obesity, unspecified: Secondary | ICD-10-CM | POA: Diagnosis not present

## 2019-03-24 DIAGNOSIS — I10 Essential (primary) hypertension: Secondary | ICD-10-CM | POA: Diagnosis not present

## 2019-03-25 DIAGNOSIS — E1165 Type 2 diabetes mellitus with hyperglycemia: Secondary | ICD-10-CM | POA: Diagnosis not present

## 2019-03-25 DIAGNOSIS — E782 Mixed hyperlipidemia: Secondary | ICD-10-CM | POA: Diagnosis not present

## 2019-03-25 DIAGNOSIS — G4733 Obstructive sleep apnea (adult) (pediatric): Secondary | ICD-10-CM | POA: Diagnosis not present

## 2019-03-25 DIAGNOSIS — I10 Essential (primary) hypertension: Secondary | ICD-10-CM | POA: Diagnosis not present

## 2019-03-26 ENCOUNTER — Other Ambulatory Visit: Payer: Self-pay | Admitting: Internal Medicine

## 2019-03-26 DIAGNOSIS — E049 Nontoxic goiter, unspecified: Secondary | ICD-10-CM

## 2019-09-08 DIAGNOSIS — Z0001 Encounter for general adult medical examination with abnormal findings: Secondary | ICD-10-CM | POA: Diagnosis not present

## 2019-09-08 DIAGNOSIS — I10 Essential (primary) hypertension: Secondary | ICD-10-CM | POA: Diagnosis not present

## 2019-09-08 DIAGNOSIS — Z1159 Encounter for screening for other viral diseases: Secondary | ICD-10-CM | POA: Diagnosis not present

## 2019-09-11 ENCOUNTER — Other Ambulatory Visit: Payer: Self-pay | Admitting: Internal Medicine

## 2019-09-11 DIAGNOSIS — Z1239 Encounter for other screening for malignant neoplasm of breast: Secondary | ICD-10-CM | POA: Diagnosis not present

## 2019-09-11 DIAGNOSIS — E1165 Type 2 diabetes mellitus with hyperglycemia: Secondary | ICD-10-CM | POA: Diagnosis not present

## 2019-09-11 DIAGNOSIS — Z23 Encounter for immunization: Secondary | ICD-10-CM | POA: Diagnosis not present

## 2019-09-11 DIAGNOSIS — Z1231 Encounter for screening mammogram for malignant neoplasm of breast: Secondary | ICD-10-CM

## 2019-09-11 DIAGNOSIS — F17290 Nicotine dependence, other tobacco product, uncomplicated: Secondary | ICD-10-CM | POA: Diagnosis not present

## 2019-09-11 DIAGNOSIS — E1142 Type 2 diabetes mellitus with diabetic polyneuropathy: Secondary | ICD-10-CM | POA: Diagnosis not present

## 2019-09-11 DIAGNOSIS — Z0001 Encounter for general adult medical examination with abnormal findings: Secondary | ICD-10-CM | POA: Diagnosis not present

## 2019-09-11 DIAGNOSIS — I10 Essential (primary) hypertension: Secondary | ICD-10-CM | POA: Diagnosis not present

## 2019-09-11 DIAGNOSIS — G4733 Obstructive sleep apnea (adult) (pediatric): Secondary | ICD-10-CM | POA: Diagnosis not present

## 2019-09-11 DIAGNOSIS — Z1159 Encounter for screening for other viral diseases: Secondary | ICD-10-CM | POA: Diagnosis not present

## 2019-11-11 ENCOUNTER — Other Ambulatory Visit: Payer: Self-pay

## 2019-11-11 ENCOUNTER — Ambulatory Visit
Admission: RE | Admit: 2019-11-11 | Discharge: 2019-11-11 | Disposition: A | Discharge status: Home / Self Care | Payer: BLUE CROSS/BLUE SHIELD | Source: Ambulatory Visit | Attending: Internal Medicine | Admitting: Internal Medicine

## 2019-11-11 DIAGNOSIS — Z1231 Encounter for screening mammogram for malignant neoplasm of breast: Secondary | ICD-10-CM

## 2019-11-16 ENCOUNTER — Other Ambulatory Visit: Payer: Self-pay | Admitting: Internal Medicine

## 2019-11-16 ENCOUNTER — Ambulatory Visit: Payer: BC Managed Care – PPO | Attending: Internal Medicine

## 2019-11-16 DIAGNOSIS — Z23 Encounter for immunization: Secondary | ICD-10-CM | POA: Insufficient documentation

## 2019-11-16 DIAGNOSIS — R928 Other abnormal and inconclusive findings on diagnostic imaging of breast: Secondary | ICD-10-CM

## 2019-11-16 NOTE — Progress Notes (Signed)
   Covid-19 Vaccination Clinic  Name:  Haleema Iodice    MRN: [id number] DOB: [birth year]  11/16/2019  Ms. Jurs was observed post Covid-19 immunization for 15 minutes without incidence. She was provided with Vaccine Information Sheet and instruction to access the V-Safe system.   Ms. Lenox was instructed to call 911 with any severe reactions post vaccine: Marland Kitchen Difficulty breathing  . Swelling of your face and throat  . A fast heartbeat  . A bad rash all over your body  . Dizziness and weakness    Immunizations Administered    Name Date Dose VIS Date Route   Pfizer COVID-19 Vaccine 11/16/2019  3:25 PM 0.3 mL 09/11/2019 Intramuscular   Manufacturer: [place]   Lot: [id number]   [place]: [id number]

## 2019-11-25 ENCOUNTER — Ambulatory Visit
Admission: RE | Admit: 2019-11-25 | Discharge: 2019-11-25 | Disposition: A | Discharge status: Home / Self Care | Payer: BC Managed Care – PPO | Source: Ambulatory Visit | Attending: Internal Medicine | Admitting: Internal Medicine

## 2019-11-25 ENCOUNTER — Other Ambulatory Visit: Payer: Self-pay | Admitting: Internal Medicine

## 2019-11-25 ENCOUNTER — Other Ambulatory Visit: Payer: Self-pay

## 2019-11-25 DIAGNOSIS — R928 Other abnormal and inconclusive findings on diagnostic imaging of breast: Secondary | ICD-10-CM | POA: Diagnosis not present

## 2019-11-25 DIAGNOSIS — N6489 Other specified disorders of breast: Secondary | ICD-10-CM | POA: Diagnosis not present

## 2019-11-26 DIAGNOSIS — Z1159 Encounter for screening for other viral diseases: Secondary | ICD-10-CM | POA: Diagnosis not present

## 2019-12-01 DIAGNOSIS — D124 Benign neoplasm of descending colon: Secondary | ICD-10-CM | POA: Diagnosis not present

## 2019-12-01 DIAGNOSIS — K621 Rectal polyp: Secondary | ICD-10-CM | POA: Diagnosis not present

## 2019-12-01 DIAGNOSIS — Z800 Family history of malignant neoplasm of digestive organs: Secondary | ICD-10-CM | POA: Diagnosis not present

## 2019-12-07 ENCOUNTER — Other Ambulatory Visit: Payer: Self-pay

## 2019-12-07 ENCOUNTER — Ambulatory Visit
Admission: RE | Admit: 2019-12-07 | Discharge: 2019-12-07 | Disposition: A | Discharge status: Home / Self Care | Payer: BC Managed Care – PPO | Source: Ambulatory Visit | Attending: Internal Medicine | Admitting: Internal Medicine

## 2019-12-07 DIAGNOSIS — N6011 Diffuse cystic mastopathy of right breast: Secondary | ICD-10-CM | POA: Diagnosis not present

## 2019-12-07 DIAGNOSIS — N6341 Unspecified lump in right breast, subareolar: Secondary | ICD-10-CM | POA: Diagnosis not present

## 2019-12-07 DIAGNOSIS — R928 Other abnormal and inconclusive findings on diagnostic imaging of breast: Secondary | ICD-10-CM

## 2019-12-07 DIAGNOSIS — N6310 Unspecified lump in the right breast, unspecified quadrant: Secondary | ICD-10-CM | POA: Diagnosis not present

## 2019-12-08 ENCOUNTER — Ambulatory Visit: Payer: BC Managed Care – PPO | Attending: Internal Medicine

## 2019-12-08 DIAGNOSIS — Z23 Encounter for immunization: Secondary | ICD-10-CM

## 2019-12-08 NOTE — Progress Notes (Signed)
   Covid-19 Vaccination Clinic  Name:  Holly Daniels    MRN: [id number] DOB: 1955-11-15  12/08/2019  Ms. Will was observed post Covid-19 immunization for 15 minutes without incident. She was provided with Vaccine Information Sheet and instruction to access the V-Safe system.   [name] was instructed to call 911 with any severe reactions post vaccine: Marland Kitchen Difficulty breathing  . Swelling of face and throat  . A fast heartbeat  . A bad rash all over body  . Dizziness and weakness   Immunizations Administered    Name Date Dose VIS Date Route   Pfizer COVID-19 Vaccine 12/08/2019  9:02 AM 0.3 mL 09/11/2019 Intramuscular   Manufacturer: [place]   Lot: [id number]   [place]: [id number]

## 2019-12-09 ENCOUNTER — Ambulatory Visit: Payer: BC Managed Care – PPO

## 2019-12-16 ENCOUNTER — Other Ambulatory Visit: Payer: Self-pay | Admitting: Internal Medicine

## 2019-12-21 ENCOUNTER — Other Ambulatory Visit: Payer: Self-pay | Admitting: Internal Medicine

## 2019-12-21 DIAGNOSIS — Z122 Encounter for screening for malignant neoplasm of respiratory organs: Secondary | ICD-10-CM

## 2020-01-18 DIAGNOSIS — I10 Essential (primary) hypertension: Secondary | ICD-10-CM | POA: Diagnosis not present

## 2020-01-18 DIAGNOSIS — E1142 Type 2 diabetes mellitus with diabetic polyneuropathy: Secondary | ICD-10-CM | POA: Diagnosis not present

## 2020-01-18 DIAGNOSIS — E782 Mixed hyperlipidemia: Secondary | ICD-10-CM | POA: Diagnosis not present

## 2020-01-18 DIAGNOSIS — E1165 Type 2 diabetes mellitus with hyperglycemia: Secondary | ICD-10-CM | POA: Diagnosis not present

## 2020-01-26 ENCOUNTER — Ambulatory Visit: Payer: BC Managed Care – PPO

## 2020-04-14 DIAGNOSIS — I10 Essential (primary) hypertension: Secondary | ICD-10-CM | POA: Diagnosis not present

## 2020-04-14 DIAGNOSIS — E782 Mixed hyperlipidemia: Secondary | ICD-10-CM | POA: Diagnosis not present

## 2020-04-14 DIAGNOSIS — E1165 Type 2 diabetes mellitus with hyperglycemia: Secondary | ICD-10-CM | POA: Diagnosis not present

## 2020-04-20 DIAGNOSIS — E782 Mixed hyperlipidemia: Secondary | ICD-10-CM | POA: Diagnosis not present

## 2020-04-20 DIAGNOSIS — E118 Type 2 diabetes mellitus with unspecified complications: Secondary | ICD-10-CM | POA: Diagnosis not present

## 2020-04-20 DIAGNOSIS — F17290 Nicotine dependence, other tobacco product, uncomplicated: Secondary | ICD-10-CM | POA: Diagnosis not present

## 2020-04-20 DIAGNOSIS — E1121 Type 2 diabetes mellitus with diabetic nephropathy: Secondary | ICD-10-CM | POA: Diagnosis not present

## 2020-04-20 DIAGNOSIS — E1142 Type 2 diabetes mellitus with diabetic polyneuropathy: Secondary | ICD-10-CM | POA: Diagnosis not present

## 2020-04-22 ENCOUNTER — Other Ambulatory Visit: Payer: Self-pay | Admitting: Internal Medicine

## 2020-04-22 DIAGNOSIS — Z122 Encounter for screening for malignant neoplasm of respiratory organs: Secondary | ICD-10-CM

## 2020-06-07 ENCOUNTER — Ambulatory Visit: Payer: Self-pay

## 2020-06-07 ENCOUNTER — Ambulatory Visit: Payer: BC Managed Care – PPO | Attending: Internal Medicine

## 2020-06-07 DIAGNOSIS — Z23 Encounter for immunization: Secondary | ICD-10-CM

## 2020-06-07 NOTE — Progress Notes (Signed)
   Covid-19 Vaccination Clinic  Name:  Holly Daniels    MRN: [id number] DOB: 08/16/1956  06/07/2020  Holly Daniels was observed post Covid-19 immunization for 15 minutes without incident. She was provided with Vaccine Information Sheet and instruction to access the V-Safe system.   Holly Daniels was instructed to call 911 with any severe reactions post vaccine: Marland Kitchen Difficulty breathing  . Swelling of face and throat  . A fast heartbeat  . A bad rash all over body  . Dizziness and weakness

## 2020-06-13 DIAGNOSIS — G4733 Obstructive sleep apnea (adult) (pediatric): Secondary | ICD-10-CM | POA: Diagnosis not present

## 2020-06-13 DIAGNOSIS — E1165 Type 2 diabetes mellitus with hyperglycemia: Secondary | ICD-10-CM | POA: Diagnosis not present

## 2020-06-13 DIAGNOSIS — R0981 Nasal congestion: Secondary | ICD-10-CM | POA: Diagnosis not present

## 2020-06-13 DIAGNOSIS — F17290 Nicotine dependence, other tobacco product, uncomplicated: Secondary | ICD-10-CM | POA: Diagnosis not present

## 2020-08-19 DIAGNOSIS — Z23 Encounter for immunization: Secondary | ICD-10-CM | POA: Diagnosis not present

## 2020-10-03 DIAGNOSIS — M5459 Other low back pain: Secondary | ICD-10-CM | POA: Diagnosis not present

## 2020-10-03 DIAGNOSIS — E118 Type 2 diabetes mellitus with unspecified complications: Secondary | ICD-10-CM | POA: Diagnosis not present

## 2020-10-03 DIAGNOSIS — Z122 Encounter for screening for malignant neoplasm of respiratory organs: Secondary | ICD-10-CM | POA: Diagnosis not present

## 2020-10-03 DIAGNOSIS — E1165 Type 2 diabetes mellitus with hyperglycemia: Secondary | ICD-10-CM | POA: Diagnosis not present

## 2020-10-03 DIAGNOSIS — E1142 Type 2 diabetes mellitus with diabetic polyneuropathy: Secondary | ICD-10-CM | POA: Diagnosis not present

## 2020-10-03 DIAGNOSIS — F17290 Nicotine dependence, other tobacco product, uncomplicated: Secondary | ICD-10-CM | POA: Diagnosis not present

## 2020-10-05 ENCOUNTER — Other Ambulatory Visit: Payer: Self-pay | Admitting: Internal Medicine

## 2020-10-05 DIAGNOSIS — M5459 Other low back pain: Secondary | ICD-10-CM

## 2020-10-10 ENCOUNTER — Encounter: Payer: Self-pay | Admitting: Rehabilitation

## 2020-10-10 NOTE — Therapy (Signed)
[facility] [address] [place], [place], [address] Phone: [phone]   Fax:  [phone]  Patient Details  Name: Holly Daniels MRN: [id number] Date of Birth: Aug 17, 1956 Referring Provider:  No ref. provider found  Encounter Date: 10/10/2020   PHYSICAL THERAPY DISCHARGE SUMMARY  Visits from Start of Care: 14  Current functional level related to goals / functional outcomes:  PT Long Term Goals - 10/17/18 1352      PT LONG TERM GOAL #1   Title Pt/husband will be independent with final HEP in order to indicate improved flexibility and improved functional mobility.  (Updated Target Date: 10/22/18)    Baseline Is consistently doing exercises but needed cues for correct technqiue.     Time 8    Period Weeks    Status On-going      PT LONG TERM GOAL #2   Title Pt will improve 6MWT distance to 1084' with use of rollator in order to indicate improved functional endurance.     Baseline 913' at STG and 934' on 09/22/18 to 1038' on 10/17/18    Time 8    Period Weeks    Status Partially Met      PT LONG TERM GOAL #3   Title Pt will report no more than 5/10 pain with functional mobility in order to indicate improved flexibility and function.     Baseline pain still gets to 7/10 with mobility/standing     Time 8    Period Weeks    Status On-going      PT LONG TERM GOAL #4   Title Pt will increase trunk flexion by 3-4" in order to indicate improved flexibility and decreased pain.     Baseline 18">21" flexion, 18">16" extension on 09/22/18    Status Revised      PT LONG TERM GOAL #5   Title Pt will ascend/descend 4 steps without rail in reciprocal pattern without UE support in order to indicate improved functional strength.      Time 4    Period Weeks    Status New             Remaining deficits: Unsure as pt was placed on hold due to family/personal issues and did not return.     Education / Equipment: HEP    Plan: Patient agrees to discharge.  Patient goals were not met. Patient is being discharged due to not returning since the last visit.  ?????    Cameron Sprang, PT, MPT [facility] [address] [place], [place], [address] Phone: [phone]   Fax:  [phone] 10/10/20, 1:12 PM

## 2020-10-11 DIAGNOSIS — E1165 Type 2 diabetes mellitus with hyperglycemia: Secondary | ICD-10-CM | POA: Diagnosis not present

## 2020-10-11 DIAGNOSIS — R112 Nausea with vomiting, unspecified: Secondary | ICD-10-CM | POA: Diagnosis not present

## 2020-10-11 DIAGNOSIS — R002 Palpitations: Secondary | ICD-10-CM | POA: Diagnosis not present

## 2020-10-11 DIAGNOSIS — Z889 Allergy status to unspecified drugs, medicaments and biological substances status: Secondary | ICD-10-CM | POA: Diagnosis not present

## 2020-10-25 ENCOUNTER — Ambulatory Visit
Admission: RE | Admit: 2020-10-25 | Discharge: 2020-10-25 | Disposition: A | Discharge status: Home / Self Care | Payer: Medicare Other | Source: Ambulatory Visit | Attending: Internal Medicine | Admitting: Internal Medicine

## 2020-10-25 DIAGNOSIS — M5459 Other low back pain: Secondary | ICD-10-CM

## 2020-10-25 DIAGNOSIS — M545 Low back pain, unspecified: Secondary | ICD-10-CM | POA: Diagnosis not present

## 2020-10-25 DIAGNOSIS — M48061 Spinal stenosis, lumbar region without neurogenic claudication: Secondary | ICD-10-CM | POA: Diagnosis not present

## 2020-10-27 DIAGNOSIS — R002 Palpitations: Secondary | ICD-10-CM | POA: Diagnosis not present

## 2020-10-27 DIAGNOSIS — M5459 Other low back pain: Secondary | ICD-10-CM | POA: Diagnosis not present

## 2020-11-02 DIAGNOSIS — I10 Essential (primary) hypertension: Secondary | ICD-10-CM | POA: Diagnosis not present

## 2020-11-02 DIAGNOSIS — I493 Ventricular premature depolarization: Secondary | ICD-10-CM | POA: Diagnosis not present

## 2020-11-02 DIAGNOSIS — E1165 Type 2 diabetes mellitus with hyperglycemia: Secondary | ICD-10-CM | POA: Diagnosis not present

## 2020-11-02 DIAGNOSIS — E876 Hypokalemia: Secondary | ICD-10-CM | POA: Diagnosis not present

## 2020-11-02 DIAGNOSIS — F17290 Nicotine dependence, other tobacco product, uncomplicated: Secondary | ICD-10-CM | POA: Diagnosis not present

## 2020-11-19 DIAGNOSIS — E1165 Type 2 diabetes mellitus with hyperglycemia: Secondary | ICD-10-CM | POA: Diagnosis not present

## 2020-11-19 DIAGNOSIS — Z794 Long term (current) use of insulin: Secondary | ICD-10-CM | POA: Diagnosis not present

## 2020-11-21 DIAGNOSIS — M48061 Spinal stenosis, lumbar region without neurogenic claudication: Secondary | ICD-10-CM | POA: Diagnosis not present

## 2020-11-21 DIAGNOSIS — Z79899 Other long term (current) drug therapy: Secondary | ICD-10-CM | POA: Diagnosis not present

## 2020-11-21 DIAGNOSIS — Z5181 Encounter for therapeutic drug level monitoring: Secondary | ICD-10-CM | POA: Diagnosis not present

## 2020-11-25 DIAGNOSIS — F17200 Nicotine dependence, unspecified, uncomplicated: Secondary | ICD-10-CM | POA: Diagnosis not present

## 2020-11-25 DIAGNOSIS — I493 Ventricular premature depolarization: Secondary | ICD-10-CM | POA: Diagnosis not present

## 2020-11-25 DIAGNOSIS — K219 Gastro-esophageal reflux disease without esophagitis: Secondary | ICD-10-CM | POA: Diagnosis not present

## 2020-11-25 DIAGNOSIS — M5459 Other low back pain: Secondary | ICD-10-CM | POA: Diagnosis not present

## 2020-11-25 DIAGNOSIS — E118 Type 2 diabetes mellitus with unspecified complications: Secondary | ICD-10-CM | POA: Diagnosis not present

## 2020-11-25 DIAGNOSIS — G4733 Obstructive sleep apnea (adult) (pediatric): Secondary | ICD-10-CM | POA: Diagnosis not present

## 2020-11-29 DIAGNOSIS — M25561 Pain in right knee: Secondary | ICD-10-CM | POA: Diagnosis not present

## 2020-11-29 DIAGNOSIS — M5459 Other low back pain: Secondary | ICD-10-CM | POA: Diagnosis not present

## 2020-11-29 DIAGNOSIS — M5416 Radiculopathy, lumbar region: Secondary | ICD-10-CM | POA: Diagnosis not present

## 2020-11-29 DIAGNOSIS — M25562 Pain in left knee: Secondary | ICD-10-CM | POA: Diagnosis not present

## 2020-12-05 ENCOUNTER — Encounter (HOSPITAL_BASED_OUTPATIENT_CLINIC_OR_DEPARTMENT_OTHER): Payer: Self-pay

## 2020-12-05 DIAGNOSIS — G4733 Obstructive sleep apnea (adult) (pediatric): Secondary | ICD-10-CM

## 2020-12-06 DIAGNOSIS — M25561 Pain in right knee: Secondary | ICD-10-CM | POA: Diagnosis not present

## 2020-12-06 DIAGNOSIS — M25562 Pain in left knee: Secondary | ICD-10-CM | POA: Diagnosis not present

## 2020-12-06 DIAGNOSIS — M48061 Spinal stenosis, lumbar region without neurogenic claudication: Secondary | ICD-10-CM | POA: Diagnosis not present

## 2020-12-06 DIAGNOSIS — M5459 Other low back pain: Secondary | ICD-10-CM | POA: Diagnosis not present

## 2020-12-06 DIAGNOSIS — M5416 Radiculopathy, lumbar region: Secondary | ICD-10-CM | POA: Diagnosis not present

## 2021-01-04 DIAGNOSIS — E782 Mixed hyperlipidemia: Secondary | ICD-10-CM | POA: Diagnosis not present

## 2021-01-04 DIAGNOSIS — I10 Essential (primary) hypertension: Secondary | ICD-10-CM | POA: Diagnosis not present

## 2021-01-04 DIAGNOSIS — E1165 Type 2 diabetes mellitus with hyperglycemia: Secondary | ICD-10-CM | POA: Diagnosis not present

## 2021-01-04 DIAGNOSIS — G4733 Obstructive sleep apnea (adult) (pediatric): Secondary | ICD-10-CM | POA: Diagnosis not present

## 2021-01-04 DIAGNOSIS — M5459 Other low back pain: Secondary | ICD-10-CM | POA: Diagnosis not present

## 2021-01-09 ENCOUNTER — Other Ambulatory Visit: Payer: Self-pay

## 2021-01-09 ENCOUNTER — Ambulatory Visit (HOSPITAL_BASED_OUTPATIENT_CLINIC_OR_DEPARTMENT_OTHER): Payer: Medicare Other | Attending: Internal Medicine | Admitting: Internal Medicine

## 2021-01-09 DIAGNOSIS — R0902 Hypoxemia: Secondary | ICD-10-CM | POA: Diagnosis not present

## 2021-01-09 DIAGNOSIS — G4733 Obstructive sleep apnea (adult) (pediatric): Secondary | ICD-10-CM | POA: Diagnosis not present

## 2021-01-21 DIAGNOSIS — G4733 Obstructive sleep apnea (adult) (pediatric): Secondary | ICD-10-CM | POA: Diagnosis not present

## 2021-01-21 NOTE — Procedures (Signed)
     Patient Name: Holly Daniels, Holly Daniels Date: 01/09/2021 Gender: Female D.O.B: 09/13/1956 Age (years): 42 Referring Provider: Latanya Presser MD Height (inches): 3 Interpreting Physician: Baird Lyons MD, ABSM Weight (lbs): 225 RPSGT: Jacolyn Reedy BMI: 39 MRN: [id number] Neck Size: 17.00  CLINICAL INFORMATION Sleep Study Type: HST Indication for sleep study: OSA Epworth Sleepiness Score: 6  SLEEP STUDY TECHNIQUE A multi-channel overnight portable sleep study was performed. The channels recorded were: nasal airflow, thoracic respiratory movement, and oxygen saturation with a pulse oximetry. Snoring was also monitored.  MEDICATIONS Patient self administered medications include: none reported.  SLEEP ARCHITECTURE Patient was studied for 423.2 minutes. The sleep efficiency was 100.0 % and the patient was supine for 56.8%. The arousal index was 0.0 per hour.  RESPIRATORY PARAMETERS The overall AHI was 16.6 per hour, with a central apnea index of 0 per hour. The oxygen nadir was 78% during sleep.  CARDIAC DATA Mean heart rate during sleep was 75.1 bpm.  IMPRESSIONS - Moderate obstructive sleep apnea occurred during this study (AHI = 16.6/h). - Oxygen desaturation was noted during this study (Min O2 = 78%). Mean O2 sat 91%. - Time with O2 saturation 89% or less was 40.5 minutes. - Patient snored.  DIAGNOSIS - Obstructive Sleep Apnea (G47.33) - Nocturnal Hypoxemia (G47.36)  RECOMMENDATIONS - Suggest CPAP titration sleep study or autopap. Other options would be based on clinical judgment. - Be careful with alcohol, sedatives and other CNS depressants that may worsen sleep apnea and disrupt normal sleep architecture. - Sleep hygiene should be reviewed to assess factors that may improve sleep quality. - Weight management and regular exercise should be initiated or continued.  [Electronically signed] 01/21/2021 10:56 AM  Baird Lyons MD, ABSM Diplomate, American  Board of Sleep Medicine   NPI: [id number]                        [place], [place] [facility]  ELECTRONICALLY SIGNED ON:  01/21/2021, 10:57 AM [place] PH: [phone]   FX: [phone] [place]

## 2021-02-02 DIAGNOSIS — E1165 Type 2 diabetes mellitus with hyperglycemia: Secondary | ICD-10-CM | POA: Diagnosis not present

## 2021-02-16 DIAGNOSIS — Z794 Long term (current) use of insulin: Secondary | ICD-10-CM | POA: Diagnosis not present

## 2021-02-16 DIAGNOSIS — E1165 Type 2 diabetes mellitus with hyperglycemia: Secondary | ICD-10-CM | POA: Diagnosis not present

## 2021-02-17 DIAGNOSIS — Z20822 Contact with and (suspected) exposure to covid-19: Secondary | ICD-10-CM | POA: Diagnosis not present

## 2021-02-28 DIAGNOSIS — E1165 Type 2 diabetes mellitus with hyperglycemia: Secondary | ICD-10-CM | POA: Diagnosis not present

## 2021-03-29 DIAGNOSIS — M5459 Other low back pain: Secondary | ICD-10-CM | POA: Diagnosis not present

## 2021-03-29 DIAGNOSIS — M179 Osteoarthritis of knee, unspecified: Secondary | ICD-10-CM | POA: Diagnosis not present

## 2021-03-29 DIAGNOSIS — E1165 Type 2 diabetes mellitus with hyperglycemia: Secondary | ICD-10-CM | POA: Diagnosis not present

## 2021-03-29 DIAGNOSIS — K219 Gastro-esophageal reflux disease without esophagitis: Secondary | ICD-10-CM | POA: Diagnosis not present

## 2021-04-11 DIAGNOSIS — I10 Essential (primary) hypertension: Secondary | ICD-10-CM | POA: Diagnosis not present

## 2021-04-11 DIAGNOSIS — E1165 Type 2 diabetes mellitus with hyperglycemia: Secondary | ICD-10-CM | POA: Diagnosis not present

## 2021-04-11 DIAGNOSIS — E782 Mixed hyperlipidemia: Secondary | ICD-10-CM | POA: Diagnosis not present

## 2021-04-14 DIAGNOSIS — E876 Hypokalemia: Secondary | ICD-10-CM | POA: Diagnosis not present

## 2021-04-14 DIAGNOSIS — G4733 Obstructive sleep apnea (adult) (pediatric): Secondary | ICD-10-CM | POA: Diagnosis not present

## 2021-04-14 DIAGNOSIS — I10 Essential (primary) hypertension: Secondary | ICD-10-CM | POA: Diagnosis not present

## 2021-04-14 DIAGNOSIS — E782 Mixed hyperlipidemia: Secondary | ICD-10-CM | POA: Diagnosis not present

## 2021-05-09 DIAGNOSIS — E1165 Type 2 diabetes mellitus with hyperglycemia: Secondary | ICD-10-CM | POA: Diagnosis not present

## 2021-05-09 DIAGNOSIS — Z794 Long term (current) use of insulin: Secondary | ICD-10-CM | POA: Diagnosis not present

## 2021-05-11 IMAGING — MG DIGITAL SCREENING BILAT W/ TOMO W/ CAD
6 of 12 series · 6 of 36 positions shown · non-contrast
Comparison: Previous exam(s).

CLINICAL DATA: Screening.

EXAM:
DIGITAL SCREENING BILATERAL MAMMOGRAM WITH TOMO AND CAD

[R MLO synth-2D (1 of 2)]
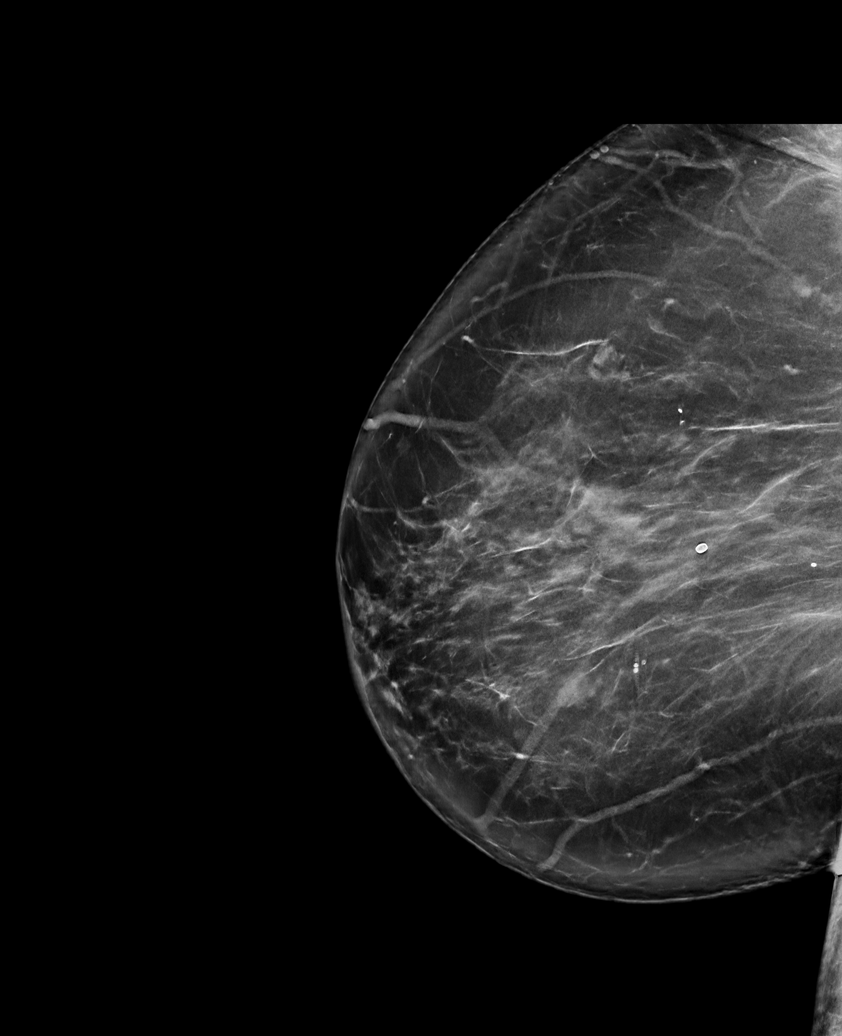

[R CC synth-2D]
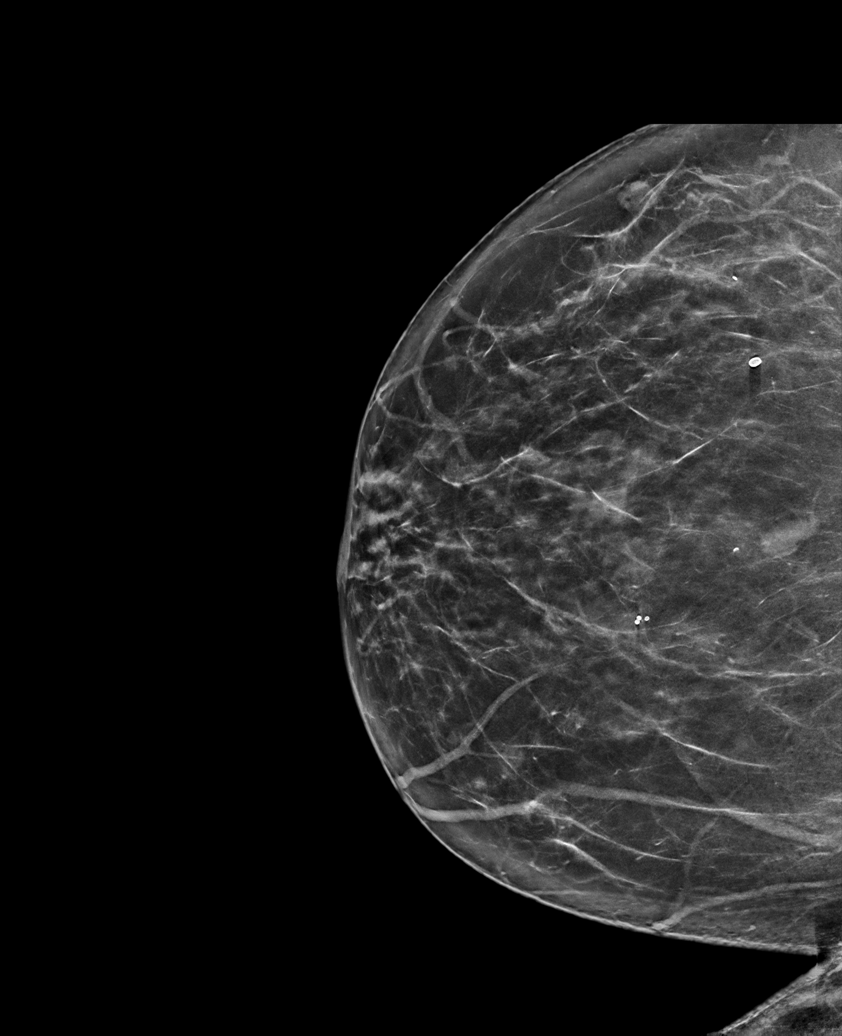

[R MLO synth-2D (2 of 2)]
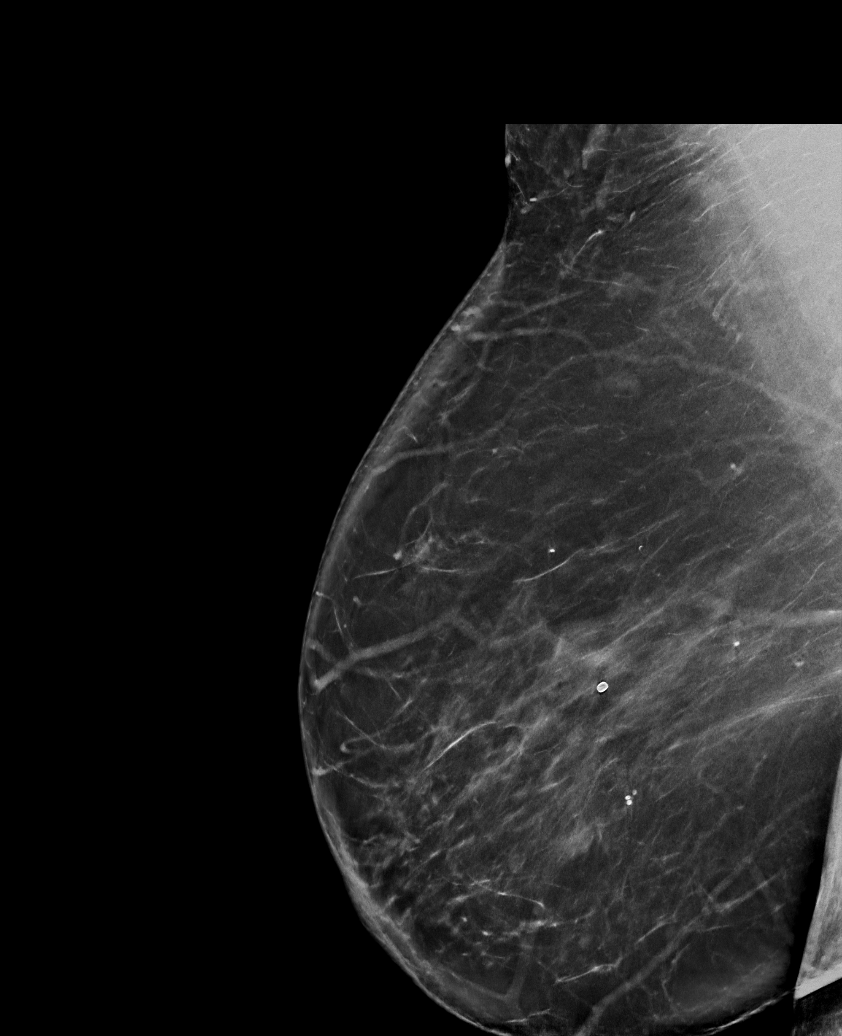

[L MLO synth-2D (1 of 2)]
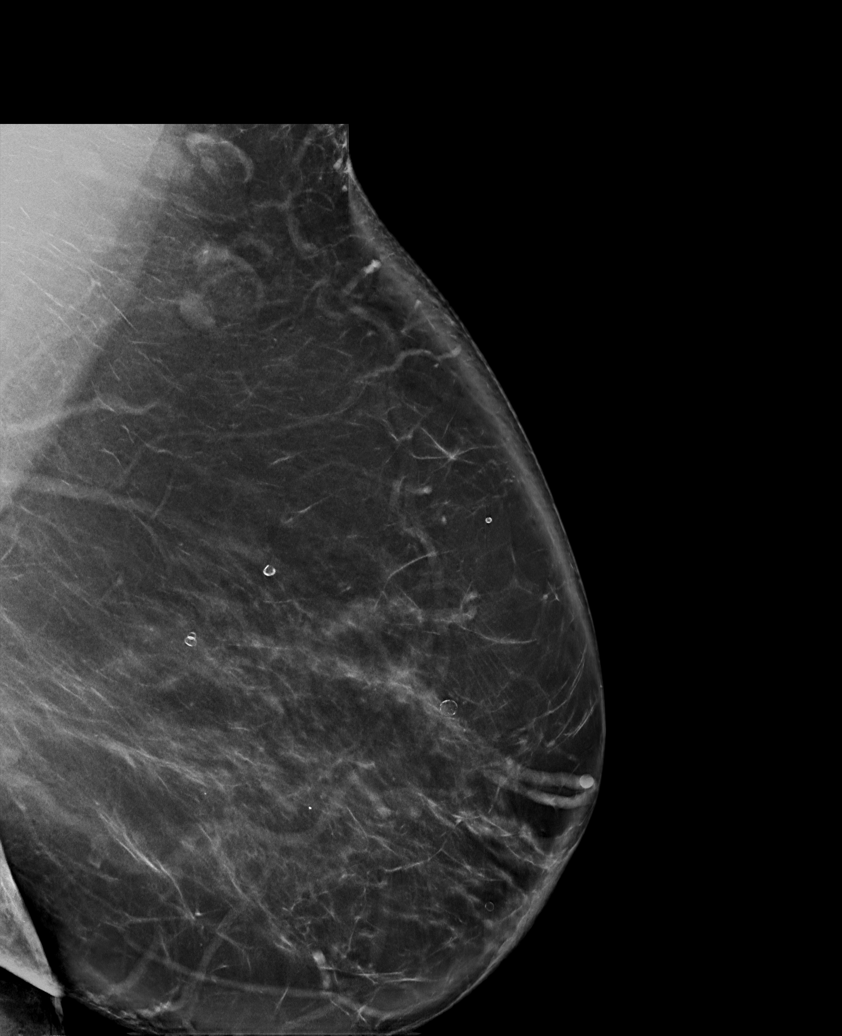

[L CC synth-2D]
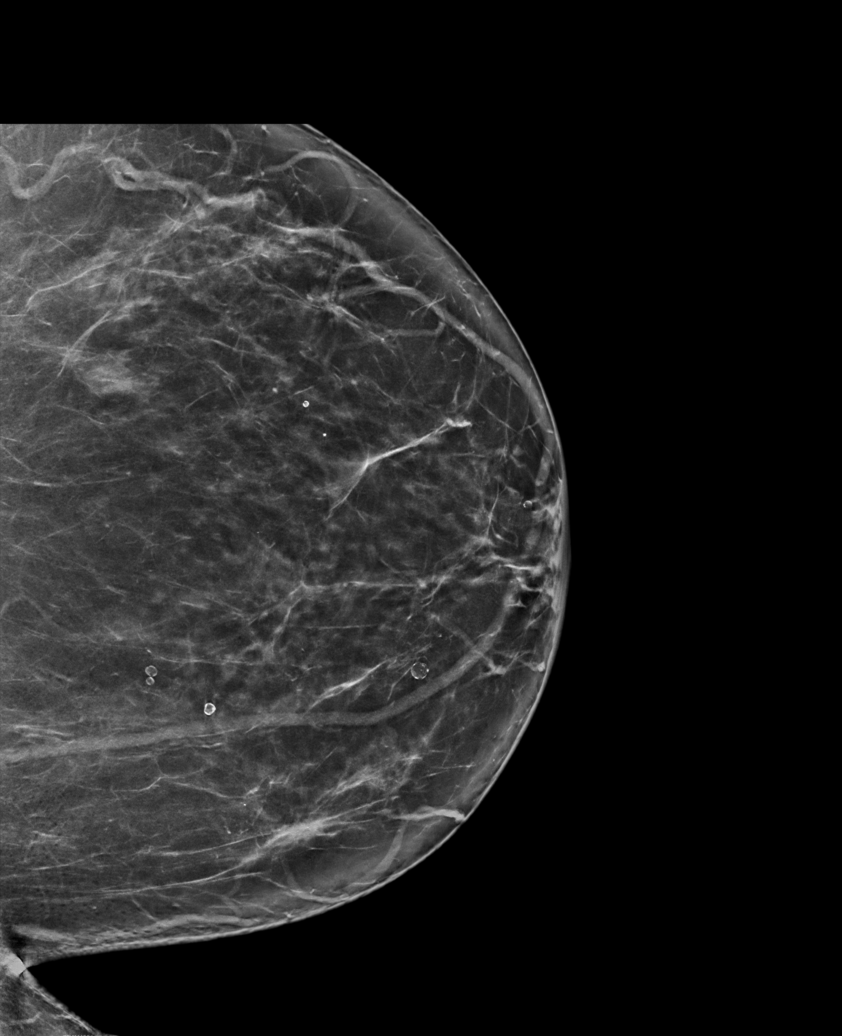

[L MLO synth-2D (2 of 2)]
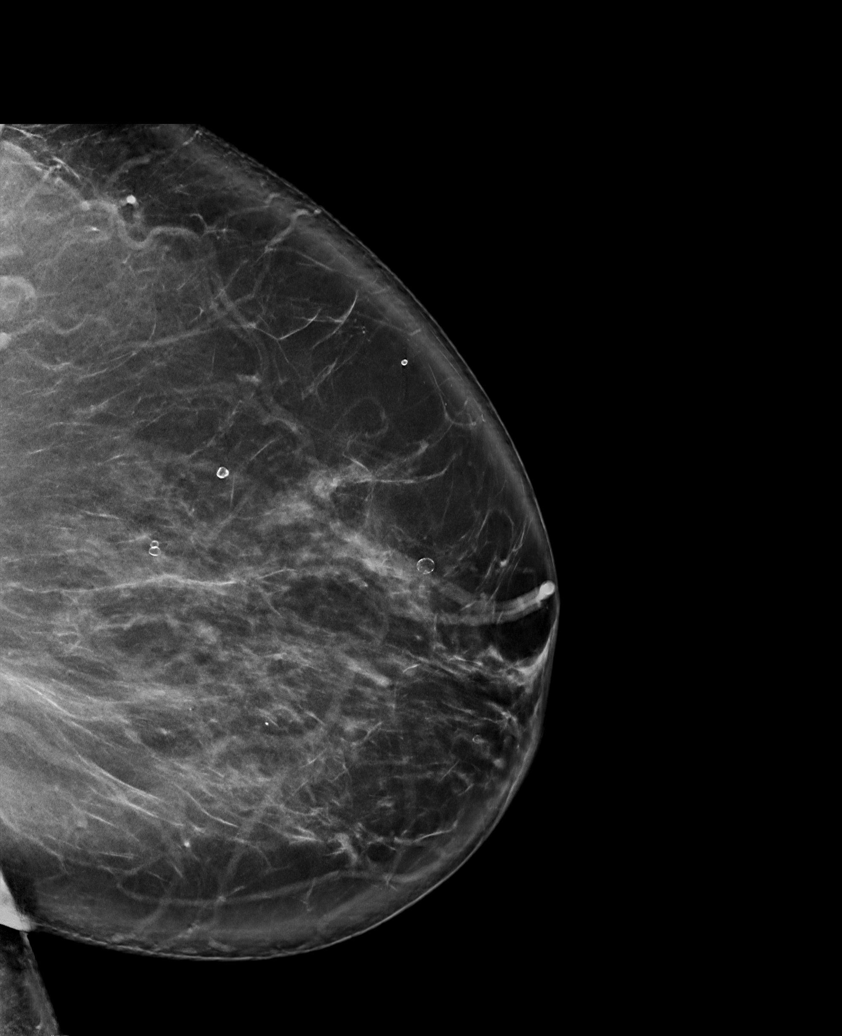

[6 of 36 positions shown; findings below may reference images not displayed]

ACR Breast Density Category b: There are scattered areas of
fibroglandular density.
FINDINGS: In the right breast, a possible mass warrants further evaluation. In
the left breast, no findings suspicious for malignancy. Images were
processed with CAD.
IMPRESSION: Further evaluation is suggested for possible mass in the right
breast.

RECOMMENDATION:
Diagnostic mammogram and possibly ultrasound of the right breast.
(Code:T1-A-550)

The patient will be contacted regarding the findings, and additional
imaging will be scheduled.

BI-RADS CATEGORY  0: Incomplete. Need additional imaging evaluation
and/or prior mammograms for comparison.

## 2021-05-12 DIAGNOSIS — E1165 Type 2 diabetes mellitus with hyperglycemia: Secondary | ICD-10-CM | POA: Diagnosis not present

## 2021-05-25 IMAGING — US US BREAST*R* LIMITED INC AXILLA
1 series · 7 of 7 positions shown · non-contrast
Comparison: Previous exam(s).

CLINICAL DATA: Patient was called back screening mammography due to
a right breast mass.

EXAM:
DIGITAL DIAGNOSTIC RIGHT MAMMOGRAM WITH TOMO
ULTRASOUND RIGHT BREAST

[Series 1: us breast*right* limited inc axilla · 0.08mm/px · 7 of 7 slices shown]
[im 1/7]
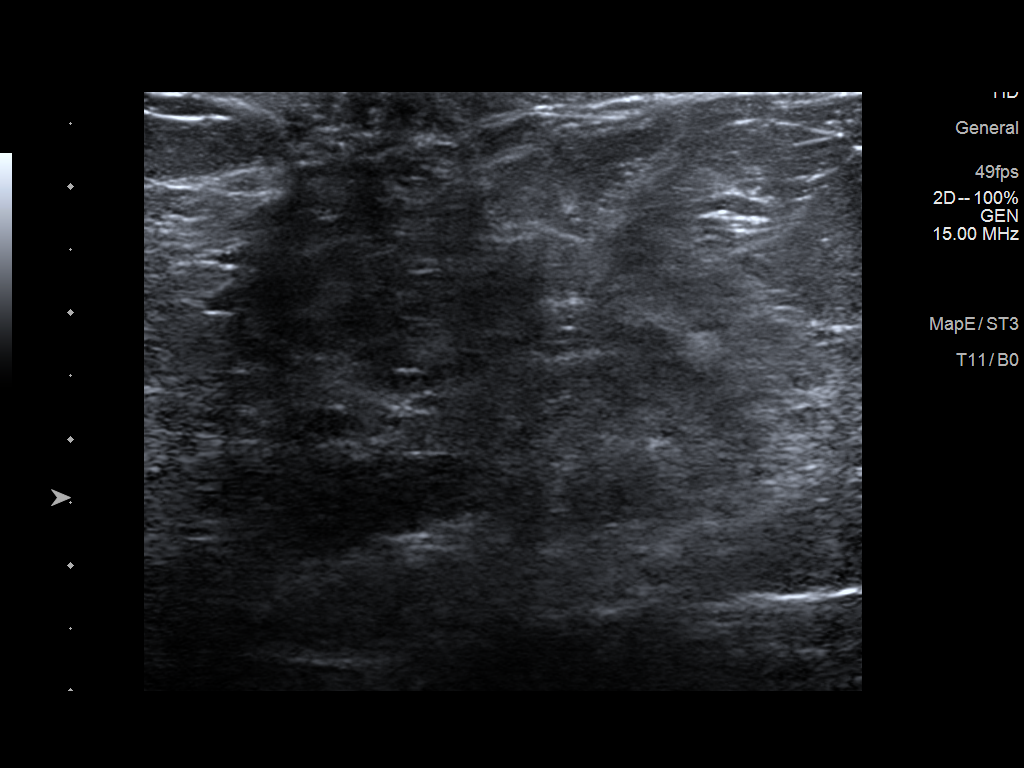
[im 2/7]
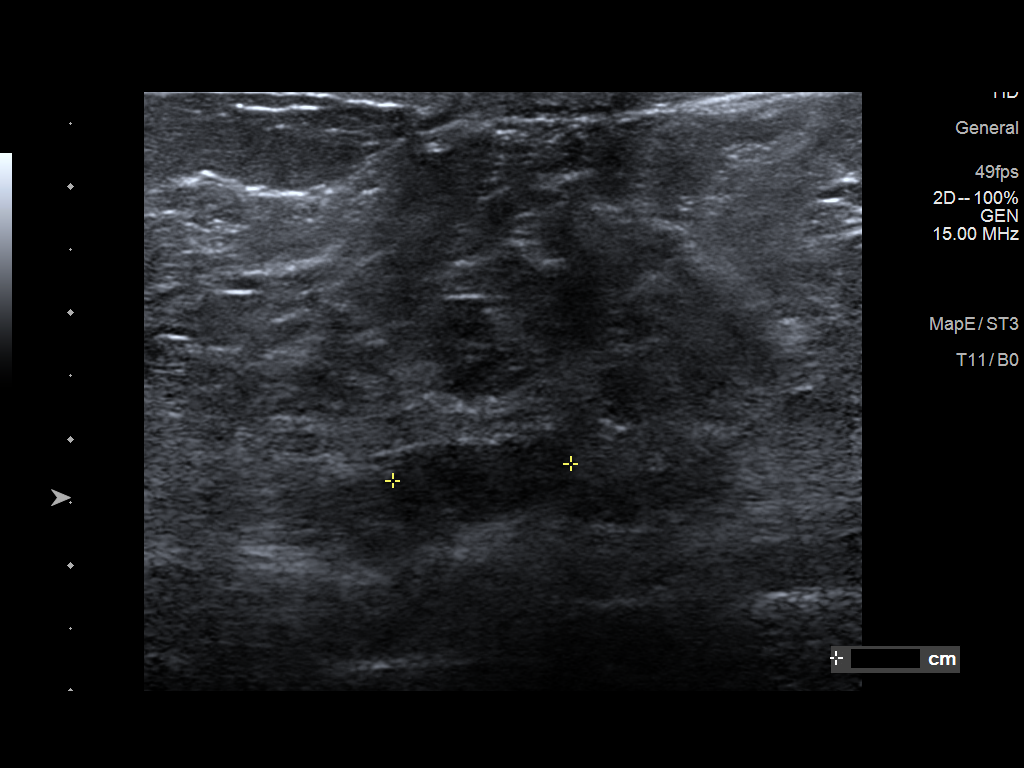
[im 3/7]
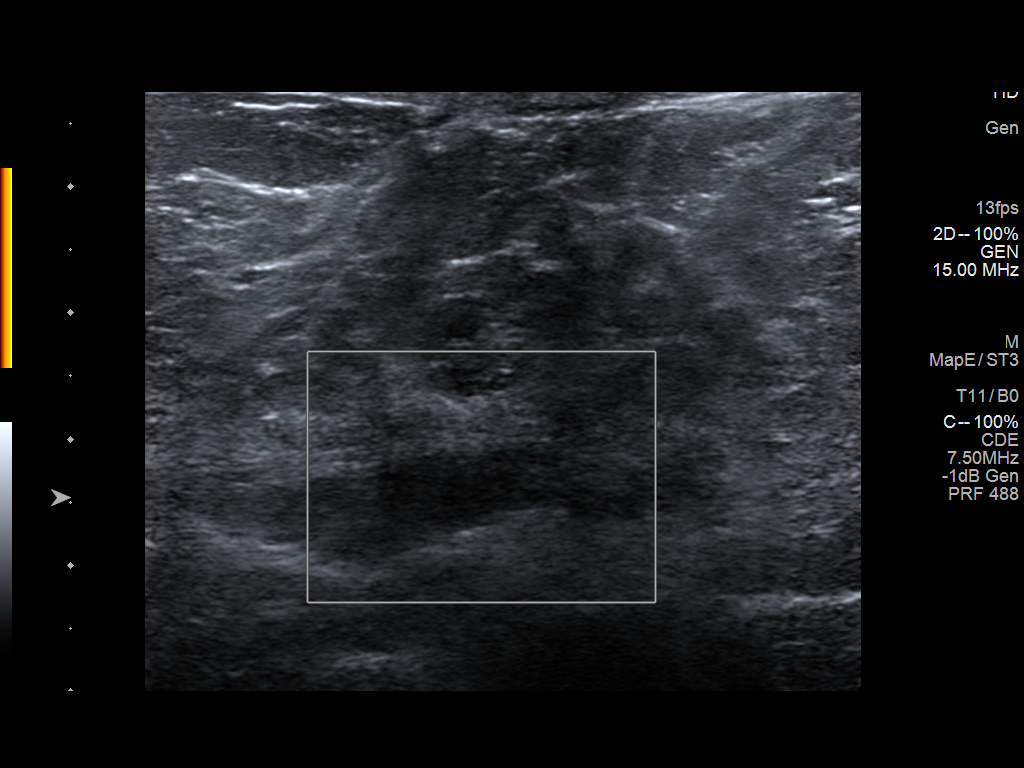
[im 4/7]
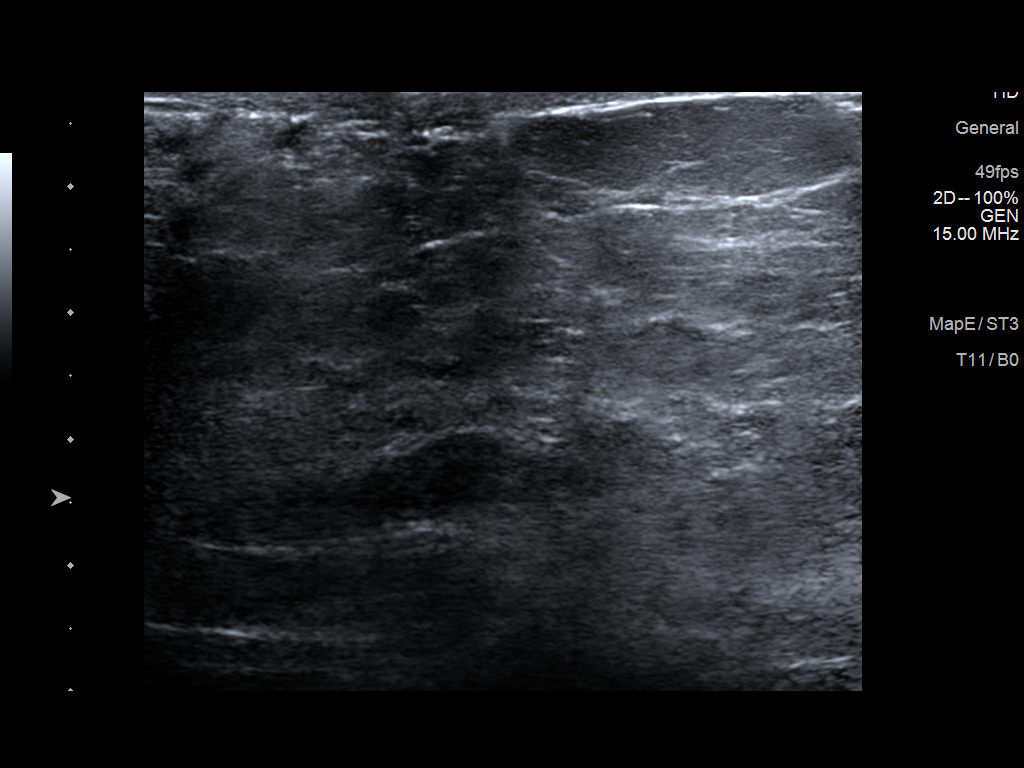
[im 5/7]
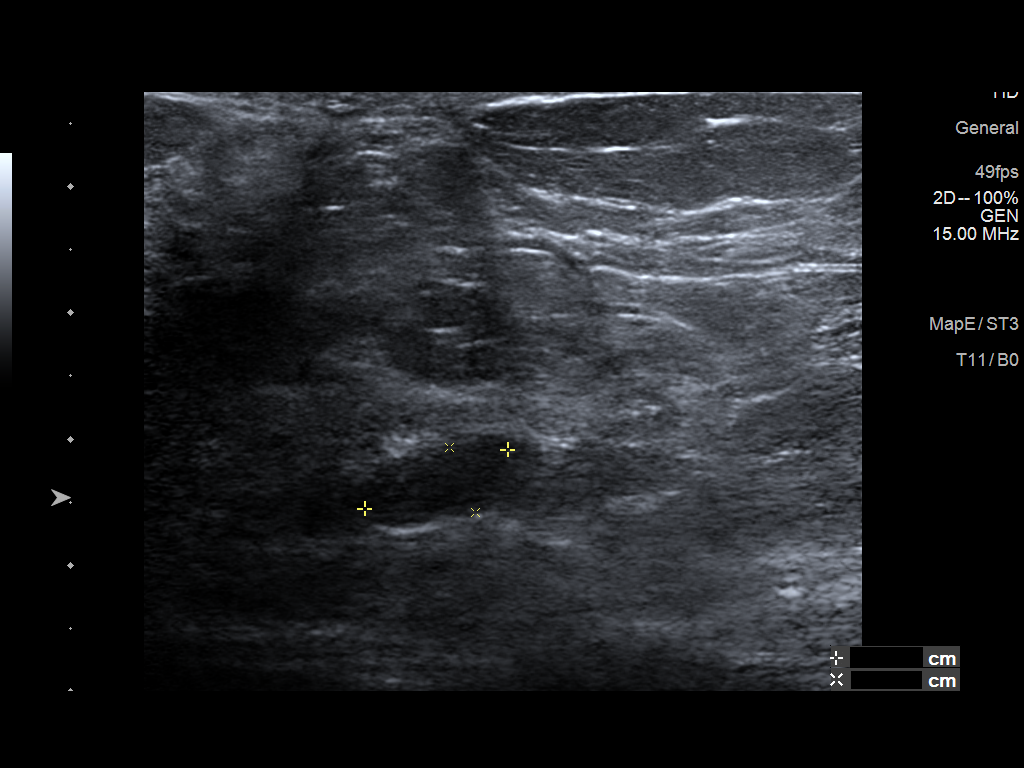
[im 6/7]
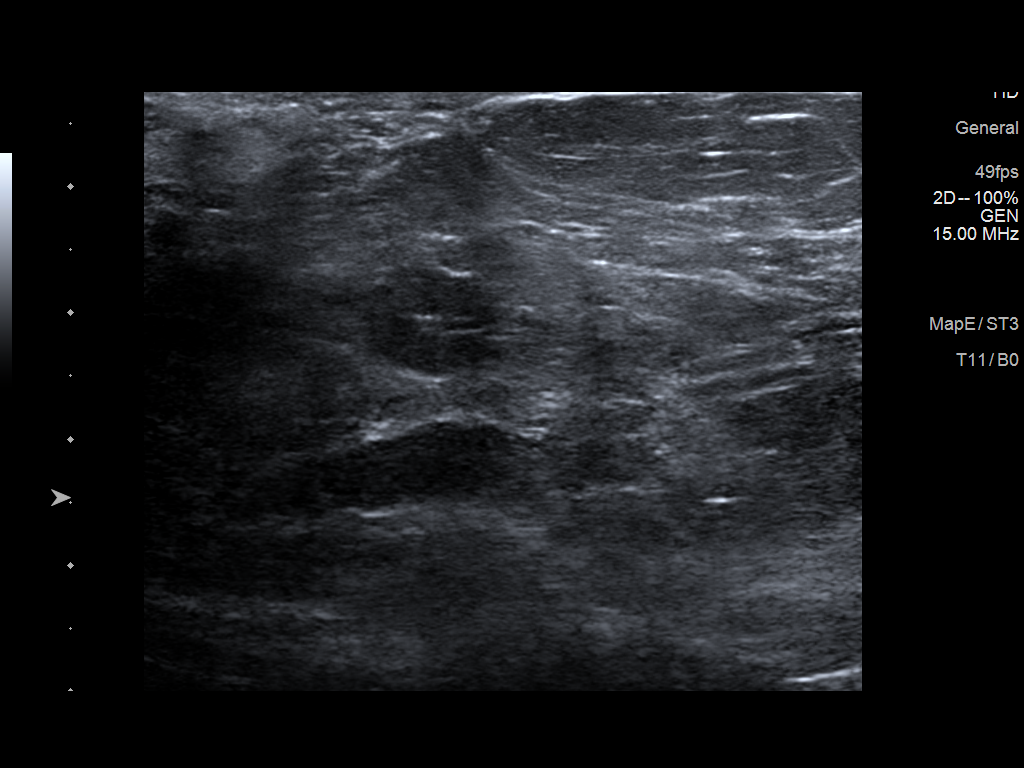
[im 7/7]
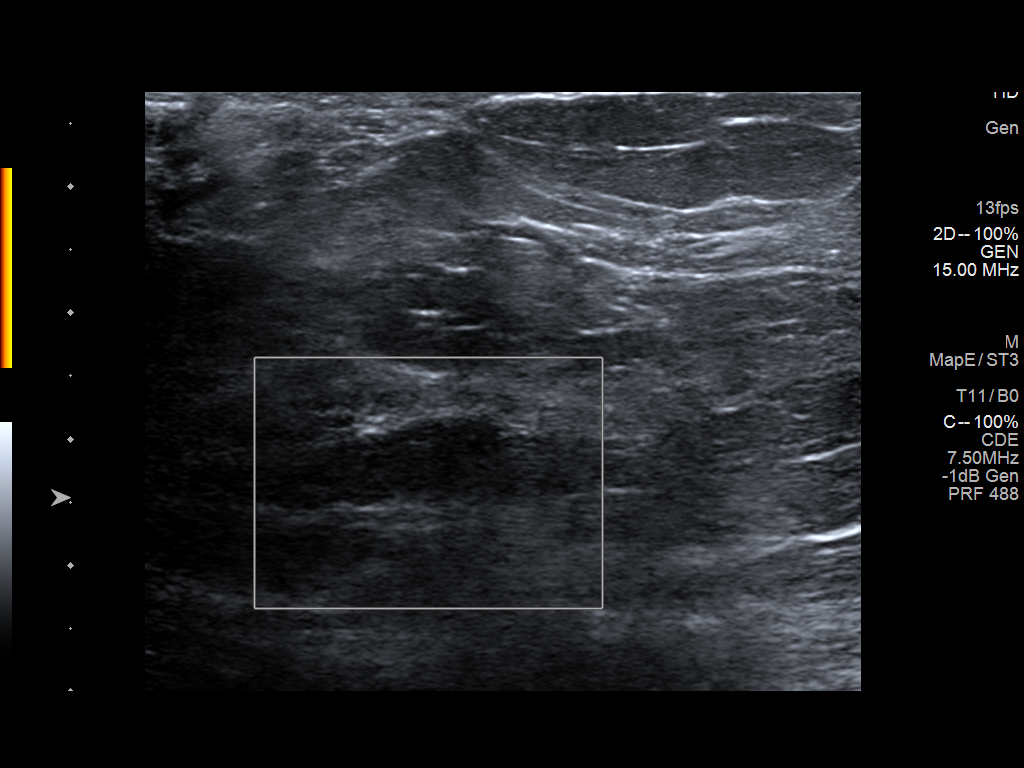

[7 of 7 positions shown; findings below may reference images not displayed]

ACR Breast Density Category b: There are scattered areas of
fibroglandular density.
FINDINGS: The mass deep in the right breast behind the nipple persists on
additional imaging.

On physical exam, no suspicious lumps are identified.

Targeted ultrasound is performed, showing a vague possible mass in
the right breast deep to the nipple measuring up to 1.4 cm. While
this may be a complicated cyst, a solid mass is not excluded with
ultrasound.
IMPRESSION: Indeterminate right breast mass. The mass is difficult to see with
ultrasound given its deep location behind the nipple.

RECOMMENDATION:
Recommend stereotactic biopsy of the right breast mass.
Ultrasound-guided biopsy/attempted aspiration is not recommended due
to the difficulty seeing the mass with ultrasound given its
location.

I have discussed the findings and recommendations with the patient.
If applicable, a reminder letter will be sent to the patient
regarding the next appointment.

BI-RADS CATEGORY  4: Suspicious.

## 2021-06-14 DIAGNOSIS — J019 Acute sinusitis, unspecified: Secondary | ICD-10-CM | POA: Diagnosis not present

## 2021-06-14 DIAGNOSIS — R0981 Nasal congestion: Secondary | ICD-10-CM | POA: Diagnosis not present

## 2021-06-14 DIAGNOSIS — R059 Cough, unspecified: Secondary | ICD-10-CM | POA: Diagnosis not present

## 2021-06-14 DIAGNOSIS — E1165 Type 2 diabetes mellitus with hyperglycemia: Secondary | ICD-10-CM | POA: Diagnosis not present

## 2021-06-14 DIAGNOSIS — J3089 Other allergic rhinitis: Secondary | ICD-10-CM | POA: Diagnosis not present

## 2021-06-14 DIAGNOSIS — E049 Nontoxic goiter, unspecified: Secondary | ICD-10-CM | POA: Diagnosis not present

## 2021-06-14 DIAGNOSIS — Z20822 Contact with and (suspected) exposure to covid-19: Secondary | ICD-10-CM | POA: Diagnosis not present

## 2021-06-14 DIAGNOSIS — Z0289 Encounter for other administrative examinations: Secondary | ICD-10-CM

## 2021-06-15 ENCOUNTER — Other Ambulatory Visit: Payer: Self-pay | Admitting: Internal Medicine

## 2021-06-15 DIAGNOSIS — E049 Nontoxic goiter, unspecified: Secondary | ICD-10-CM

## 2021-06-22 ENCOUNTER — Encounter (INDEPENDENT_AMBULATORY_CARE_PROVIDER_SITE_OTHER): Payer: Self-pay | Admitting: Family Medicine

## 2021-06-22 ENCOUNTER — Other Ambulatory Visit: Payer: Self-pay

## 2021-06-22 ENCOUNTER — Ambulatory Visit (INDEPENDENT_AMBULATORY_CARE_PROVIDER_SITE_OTHER): Payer: Medicare Other | Admitting: Family Medicine

## 2021-06-22 VITALS — BP 120/72 | HR 81 | Temp 98.5°F | Ht 64.0 in | Wt 203.0 lb

## 2021-06-22 DIAGNOSIS — E1159 Type 2 diabetes mellitus with other circulatory complications: Secondary | ICD-10-CM

## 2021-06-22 DIAGNOSIS — R0602 Shortness of breath: Secondary | ICD-10-CM

## 2021-06-22 DIAGNOSIS — R5383 Other fatigue: Secondary | ICD-10-CM | POA: Diagnosis not present

## 2021-06-22 DIAGNOSIS — E1165 Type 2 diabetes mellitus with hyperglycemia: Secondary | ICD-10-CM | POA: Diagnosis not present

## 2021-06-22 DIAGNOSIS — E669 Obesity, unspecified: Secondary | ICD-10-CM

## 2021-06-22 DIAGNOSIS — I10 Essential (primary) hypertension: Secondary | ICD-10-CM | POA: Diagnosis not present

## 2021-06-22 DIAGNOSIS — G4733 Obstructive sleep apnea (adult) (pediatric): Secondary | ICD-10-CM

## 2021-06-22 DIAGNOSIS — E1169 Type 2 diabetes mellitus with other specified complication: Secondary | ICD-10-CM

## 2021-06-22 DIAGNOSIS — I152 Hypertension secondary to endocrine disorders: Secondary | ICD-10-CM

## 2021-06-22 DIAGNOSIS — Z794 Long term (current) use of insulin: Secondary | ICD-10-CM

## 2021-06-22 DIAGNOSIS — Z1331 Encounter for screening for depression: Secondary | ICD-10-CM | POA: Diagnosis not present

## 2021-06-22 DIAGNOSIS — Z6834 Body mass index (BMI) 34.0-34.9, adult: Secondary | ICD-10-CM

## 2021-06-22 DIAGNOSIS — E569 Vitamin deficiency, unspecified: Secondary | ICD-10-CM | POA: Diagnosis not present

## 2021-06-22 DIAGNOSIS — E785 Hyperlipidemia, unspecified: Secondary | ICD-10-CM | POA: Diagnosis not present

## 2021-06-22 DIAGNOSIS — E559 Vitamin D deficiency, unspecified: Secondary | ICD-10-CM | POA: Diagnosis not present

## 2021-06-22 NOTE — Progress Notes (Signed)
Chief Complaint:   OBESITY Holly Daniels (MR# [id number]) is a 65 y.o. female who presents for evaluation and treatment of obesity and related comorbidities. Current BMI is Body mass index is 34.84 kg/m. Holly Daniels has been struggling with her weight for many years and has been unsuccessful in either losing weight, maintaining weight loss, or reaching her healthy weight goal.  Holly Daniels is currently in the action stage of change and ready to dedicate time achieving and maintaining a healthier weight. Holly Daniels is interested in becoming our patient and working on intensive lifestyle modifications including (but not limited to) diet and exercise for weight loss.  Holly Daniels was referred by a person in clothing store. She is eating out 4 times a week. She often skips breakfast at breakfast time. ~12 PM Breakfast- 2 egg meal at [employer] with bacon/sausage and pancake/toast- eats eggs, toast (satisfied); ~8PM- can of [employer] Chicken Noodle or [employer] Beef and Vegetable (satisfied); Snacking can sometimes be increased in quantity.  Holly Daniels's habits were reviewed today and are as follows: Her family eats meals together, she thinks her family will eat healthier with her, her desired weight loss is 43 lbs, she started gaining weight after menopause, her heaviest weight ever was 240 pounds, she has significant food cravings issues, she snacks frequently in the evenings, she skips meals frequently, she is frequently drinking liquids with calories, she frequently makes poor food choices, she frequently eats larger portions than normal, she has binge eating behaviors, and she struggles with emotional eating.  Depression Screen Holly Daniels's Food and Mood (modified PHQ-9) score was 7.  Depression screen [facility] 2/9 06/22/2021  Decreased Interest 1  Down, Depressed, Hopeless 0  PHQ - 2 Score 1  Altered sleeping 0  Tired, decreased energy 2  Change in appetite 1  Feeling bad or failure about yourself  1  Trouble concentrating  2  Moving slowly or fidgety/restless 0  Suicidal thoughts 0  PHQ-9 Score 7  Difficult doing work/chores Not difficult at all   Subjective:   1. Other fatigue Holly Daniels admits to daytime somnolence and admits to waking up still tired. Patent has a history of symptoms of daytime fatigue, morning fatigue, and morning headache. Holly Daniels generally gets  5-7  hours of sleep per night, and states that she has generally restful sleep. Snoring is present. Apneic episodes are present. Epworth Sleepiness Score is 12. EKG normal sinus rhythm at 80 bpm- Poor R-wave progression.  2. SOB (shortness of breath) Holly Daniels notes increasing shortness of breath with exercising and seems to be worsening over time with weight gain. She notes getting out of breath sooner with activity than she used to. This has gotten worse recently. Holly Daniels denies shortness of breath at rest or orthopnea.  3. Type 2 diabetes mellitus with hyperglycemia, with long-term current use of insulin (HCC) Diagnosed about 15 years ago and has been on insulin for the last 10 years. Holly Daniels's last A1c was 6.4. Blood sugars run 70-130. She is on Humalog sliding scale.   4. Hypertension associated with diabetes ([place]) Diagnosed 40 years ago. BP well controlled. Pt denies chest pain/chest pressure/headache. She is on amlodipine, HCTZ, ARB.  5. Hyperlipidemia associated with type 2 diabetes mellitus ([place]) Holly Daniels is on statin therapy and denies transaminitis. No fasting lipid panel in Epic.  6. OSA (obstructive sleep apnea) Holly Daniels has CPAP but doesn't use it. She doesn't have any mask, except initial mask.  7. Vitamin deficiency Pt denies nausea, vomiting, and muscle weakness but notes  fatigue. Diagnosis likely given due to obesity.  Assessment/Plan:   1. Other fatigue Holly Daniels does feel that her weight is causing her energy to be lower than it should be. Fatigue may be related to obesity, depression or many other causes. Labs will be ordered, and in the  meanwhile, Holly Daniels will focus on self care including making healthy food choices, increasing physical activity and focusing on stress reduction. Check labs today.  - EKG 12-Lead - T3 - T4, free - TSH  2. SOB (shortness of breath) Holly Daniels does feel that she gets out of breath more easily that she used to when she exercises. Holly Daniels's shortness of breath appears to be obesity related and exercise induced. She has agreed to work on weight loss and gradually increase exercise to treat her exercise induced shortness of breath. Will continue to monitor closely. Check labs today.  - CBC with Differential/Platelet  3. Type 2 diabetes mellitus with hyperglycemia, with long-term current use of insulin (HCC) Good blood sugar control is important to decrease the likelihood of diabetic complications such as nephropathy, neuropathy, limb loss, blindness, coronary artery disease, and death. Intensive lifestyle modification including diet, exercise and weight loss are the first line of treatment for diabetes. Check labs today.  - Hemoglobin A1c - C-peptide  4. Hypertension associated with diabetes ([place]) Holly Daniels is working on healthy weight loss and exercise to improve blood pressure control. We will watch for signs of hypotension as she continues her lifestyle modifications. Check labs today.  - Comprehensive metabolic panel  5. Hyperlipidemia associated with type 2 diabetes mellitus ([place]) Cardiovascular risk and specific lipid/LDL goals reviewed.  We discussed several lifestyle modifications today and Holly Daniels will continue to work on diet, exercise and weight loss efforts. Orders and follow up as documented in patient record.   Counseling Intensive lifestyle modifications are the first line treatment for this issue. Dietary changes: Increase soluble fiber. Decrease simple carbohydrates. Exercise changes: Moderate to vigorous-intensity aerobic activity 150 minutes per week if tolerated. Lipid-lowering  medications: see documented in medical record. Check labs today.  - Lipid Panel With LDL/HDL Ratio  6. OSA (obstructive sleep apnea) Intensive lifestyle modifications are the first line treatment for this issue. We discussed several lifestyle modifications today and she will continue to work on diet, exercise and weight loss efforts. We will continue to monitor. Orders and follow up as documented in patient record.   7. Vitamin deficiency Low Vitamin D level contributes to fatigue and are associated with obesity, breast, and colon cancer. She will follow-up for routine testing of Vitamin D, at least 2-3 times per year to avoid over-replacement. Check labs today.  - VITAMIN D 25 Hydroxy (Vit-D Deficiency, Fractures)  8. Depression screening Holly Daniels had a positive depression screening. Depression is commonly associated with obesity and often results in emotional eating behaviors. We will monitor this closely and work on CBT to help improve the non-hunger eating patterns. Referral to Psychology may be required if no improvement is seen as she continues in our clinic.  9. Obesity with current BMI of 34.9  Holly Daniels is currently in the action stage of change and her goal is to continue with weight loss efforts. I recommend Holly Daniels begin the structured treatment plan as follows:  She has agreed to keeping a food journal and adhering to recommended goals of 1300-1500 calories and 85+ grams protein.  Exercise goals: No exercise has been prescribed at this time.   Behavioral modification strategies: increasing lean protein intake, no skipping meals, meal  planning and cooking strategies, keeping healthy foods in the home, and planning for success.  She was informed of the importance of frequent follow-up visits to maximize her success with intensive lifestyle modifications for her multiple health conditions. She was informed we would discuss her lab results at her next visit unless there is a critical issue  that needs to be addressed sooner. Holly Daniels agreed to keep her next visit at the agreed upon time to discuss these results.  Objective:   Blood pressure 120/72, pulse 81, temperature 98.5 F (36.9 C), height 5\' 4"  (1.626 m), weight 203 lb (92.1 kg), SpO2 98 %. Body mass index is 34.84 kg/m.  EKG: Normal sinus rhythm, rate 80- Poor R-wave progression.  Indirect Calorimeter completed today shows a VO2 of 275 and a REE of 1901.  Her calculated basal metabolic rate is 8588 thus her basal metabolic rate is better than expected.  General: Cooperative, alert, well developed, in no acute distress. HEENT: Conjunctivae and lids unremarkable. Cardiovascular: Regular rhythm.  Lungs: Normal work of breathing. Neurologic: No focal deficits.   Lab Results  Component Value Date   CREATININE 0.92 12/05/2015   BUN 17 12/05/2015   NA 140 12/05/2015   K 3.9 12/05/2015   CL 105 12/05/2015   CO2 26 12/05/2015   Lab Results  Component Value Date   ALT 17 12/05/2015   AST 20 12/05/2015   ALKPHOS 69 12/05/2015   BILITOT 0.6 12/05/2015   Lab Results  Component Value Date   HGBA1C 9.1 (H) 12/05/2015   No results found for: INSULIN Lab Results  Component Value Date   TSH 0.568 12/05/2015   Lab Results  Component Value Date   CHOL 252 (H) 12/05/2015   HDL 46 12/05/2015   LDLCALC 171 (H) 12/05/2015   TRIG 175 (H) 12/05/2015   CHOLHDL 5.5 12/05/2015   Lab Results  Component Value Date   WBC 10.1 01/04/2012   HGB 15.2 (H) 01/04/2012   HCT 44.0 01/04/2012   MCV 87.3 01/04/2012   PLT 177 01/04/2012   Attestation Statements:   Reviewed by clinician on day of visit: allergies, medications, problem list, medical history, surgical history, family history, social history, and previous encounter notes.  Coral Ceo, CMA, am acting as transcriptionist for Coralie Common, MD.  This is the patient's first visit at [facility]. The patient's NEW PATIENT PACKET was reviewed  at length. Included in the packet: current and past health history, medications, allergies, ROS, gynecologic history (women only), surgical history, family history, social history, weight history, weight loss surgery history (for those that have had weight loss surgery), nutritional evaluation, mood and food questionnaire, PHQ9, Epworth questionnaire, sleep habits questionnaire, patient life and health improvement goals questionnaire. These will all be scanned into the patient's chart under media.   During the visit, I independently reviewed the patient's EKG, bioimpedance scale results, and indirect calorimeter results. I used this information to tailor a meal plan for the patient that will help her to lose weight and will improve her obesity-related conditions going forward. I performed a medically necessary appropriate examination and/or evaluation. I discussed the assessment and treatment plan with the patient. The patient was provided an opportunity to ask questions and all were answered. The patient agreed with the plan and demonstrated an understanding of the instructions. Labs were ordered at this visit and will be reviewed at the next visit unless more critical results need to be addressed immediately. Clinical information was updated and documented  in the EMR.   Time spent on visit including pre-visit chart review and post-visit care was 60 minutes.   A separate 15 minutes was spent on risk counseling (see above).  I have reviewed the above documentation for accuracy and completeness, and I agree with the above. - Coralie Common, MD

## 2021-06-23 LAB — VITAMIN D 25 HYDROXY (VIT D DEFICIENCY, FRACTURES): Vit D, 25-Hydroxy: 15.4 ng/mL — ABNORMAL LOW (ref 30.0–100.0)

## 2021-06-23 LAB — CBC WITH DIFFERENTIAL/PLATELET
Basophils Absolute: 0 10*3/uL (ref 0.0–0.2)
Basos: 0 %
EOS (ABSOLUTE): 0 10*3/uL (ref 0.0–0.4)
Eos: 0 %
Hematocrit: 47.9 % — ABNORMAL HIGH (ref 34.0–46.6)
Hemoglobin: 16.2 g/dL — ABNORMAL HIGH (ref 11.1–15.9)
Immature Grans (Abs): 0 10*3/uL (ref 0.0–0.1)
Immature Granulocytes: 0 %
Lymphocytes Absolute: 2.2 10*3/uL (ref 0.7–3.1)
Lymphs: 23 %
MCH: 29.9 pg (ref 26.6–33.0)
MCHC: 33.8 g/dL (ref 31.5–35.7)
MCV: 89 fL (ref 79–97)
Monocytes Absolute: 0.6 10*3/uL (ref 0.1–0.9)
Monocytes: 7 %
Neutrophils Absolute: 6.5 10*3/uL (ref 1.4–7.0)
Neutrophils: 70 %
Platelets: 183 10*3/uL (ref 150–450)
RBC: 5.41 x10E6/uL — ABNORMAL HIGH (ref 3.77–5.28)
RDW: 14.8 % (ref 11.7–15.4)
WBC: 9.4 10*3/uL (ref 3.4–10.8)

## 2021-06-23 LAB — LIPID PANEL WITH LDL/HDL RATIO
Cholesterol, Total: 129 mg/dL (ref 100–199)
HDL: 33 mg/dL — ABNORMAL LOW (ref >39)
LDL Chol Calc (NIH): 73 mg/dL (ref 0–99)
LDL/HDL Ratio: 2.2 ratio (ref 0.0–3.2)
Triglycerides: 130 mg/dL (ref 0–149)
VLDL Cholesterol Cal: 23 mg/dL (ref 5–40)

## 2021-06-23 LAB — COMPREHENSIVE METABOLIC PANEL
ALT: 13 IU/L (ref 0–32)
AST: 15 IU/L (ref 0–40)
Albumin/Globulin Ratio: 1.9 (ref 1.2–2.2)
Albumin: 4.6 g/dL (ref 3.8–4.8)
Alkaline Phosphatase: 86 IU/L (ref 44–121)
BUN/Creatinine Ratio: 19 (ref 12–28)
BUN: 20 mg/dL (ref 8–27)
Bilirubin Total: 0.3 mg/dL (ref 0.0–1.2)
CO2: 21 mmol/L (ref 20–29)
Calcium: 10 mg/dL (ref 8.7–10.3)
Chloride: 102 mmol/L (ref 96–106)
Creatinine, Ser: 1.04 mg/dL — ABNORMAL HIGH (ref 0.57–1.00)
Globulin, Total: 2.4 g/dL (ref 1.5–4.5)
Glucose: 111 mg/dL — ABNORMAL HIGH (ref 65–99)
Potassium: 4.3 mmol/L (ref 3.5–5.2)
Sodium: 142 mmol/L (ref 134–144)
Total Protein: 7 g/dL (ref 6.0–8.5)
eGFR: 60 mL/min/{1.73_m2} (ref >59)

## 2021-06-23 LAB — TSH: TSH: 1.07 u[IU]/mL (ref 0.450–4.500)

## 2021-06-23 LAB — C-PEPTIDE: C-Peptide: 4.6 ng/mL — ABNORMAL HIGH (ref 1.1–4.4)

## 2021-06-23 LAB — T3: T3, Total: 107 ng/dL (ref 71–180)

## 2021-06-23 LAB — HEMOGLOBIN A1C
Est. average glucose Bld gHb Est-mCnc: 154 mg/dL
Hgb A1c MFr Bld: 7 % — ABNORMAL HIGH (ref 4.8–5.6)

## 2021-06-23 LAB — T4, FREE: Free T4: 1.19 ng/dL (ref 0.82–1.77)

## 2021-07-06 ENCOUNTER — Ambulatory Visit (INDEPENDENT_AMBULATORY_CARE_PROVIDER_SITE_OTHER): Payer: Self-pay | Admitting: Family Medicine

## 2021-07-07 DIAGNOSIS — Z1231 Encounter for screening mammogram for malignant neoplasm of breast: Secondary | ICD-10-CM | POA: Diagnosis not present

## 2021-07-07 DIAGNOSIS — E1142 Type 2 diabetes mellitus with diabetic polyneuropathy: Secondary | ICD-10-CM | POA: Diagnosis not present

## 2021-07-07 DIAGNOSIS — G4733 Obstructive sleep apnea (adult) (pediatric): Secondary | ICD-10-CM | POA: Diagnosis not present

## 2021-07-07 DIAGNOSIS — E1122 Type 2 diabetes mellitus with diabetic chronic kidney disease: Secondary | ICD-10-CM | POA: Diagnosis not present

## 2021-07-07 DIAGNOSIS — Z13820 Encounter for screening for osteoporosis: Secondary | ICD-10-CM | POA: Diagnosis not present

## 2021-07-07 DIAGNOSIS — E1165 Type 2 diabetes mellitus with hyperglycemia: Secondary | ICD-10-CM | POA: Diagnosis not present

## 2021-07-07 DIAGNOSIS — Z0001 Encounter for general adult medical examination with abnormal findings: Secondary | ICD-10-CM | POA: Diagnosis not present

## 2021-07-07 DIAGNOSIS — Z23 Encounter for immunization: Secondary | ICD-10-CM | POA: Diagnosis not present

## 2021-07-07 DIAGNOSIS — F17290 Nicotine dependence, other tobacco product, uncomplicated: Secondary | ICD-10-CM | POA: Diagnosis not present

## 2021-07-07 DIAGNOSIS — Z1211 Encounter for screening for malignant neoplasm of colon: Secondary | ICD-10-CM | POA: Diagnosis not present

## 2021-07-10 ENCOUNTER — Other Ambulatory Visit: Payer: Self-pay | Admitting: Internal Medicine

## 2021-07-10 DIAGNOSIS — Z122 Encounter for screening for malignant neoplasm of respiratory organs: Secondary | ICD-10-CM

## 2021-07-14 ENCOUNTER — Other Ambulatory Visit: Payer: Self-pay | Admitting: Internal Medicine

## 2021-07-14 DIAGNOSIS — Z1231 Encounter for screening mammogram for malignant neoplasm of breast: Secondary | ICD-10-CM

## 2021-07-20 DIAGNOSIS — E1165 Type 2 diabetes mellitus with hyperglycemia: Secondary | ICD-10-CM | POA: Diagnosis not present

## 2021-07-25 ENCOUNTER — Ambulatory Visit (INDEPENDENT_AMBULATORY_CARE_PROVIDER_SITE_OTHER): Payer: Medicare Other | Admitting: Bariatrics

## 2021-07-27 ENCOUNTER — Encounter (INDEPENDENT_AMBULATORY_CARE_PROVIDER_SITE_OTHER): Payer: Self-pay | Admitting: Bariatrics

## 2021-07-27 ENCOUNTER — Other Ambulatory Visit: Payer: Self-pay

## 2021-07-27 ENCOUNTER — Ambulatory Visit (INDEPENDENT_AMBULATORY_CARE_PROVIDER_SITE_OTHER): Payer: Medicare Other | Admitting: Bariatrics

## 2021-07-27 VITALS — BP 124/80 | HR 72 | Temp 98.2°F | Ht 64.0 in | Wt 200.0 lb

## 2021-07-27 DIAGNOSIS — E1169 Type 2 diabetes mellitus with other specified complication: Secondary | ICD-10-CM

## 2021-07-27 DIAGNOSIS — R718 Other abnormality of red blood cells: Secondary | ICD-10-CM | POA: Diagnosis not present

## 2021-07-27 DIAGNOSIS — D582 Other hemoglobinopathies: Secondary | ICD-10-CM | POA: Diagnosis not present

## 2021-07-27 DIAGNOSIS — E669 Obesity, unspecified: Secondary | ICD-10-CM

## 2021-07-27 DIAGNOSIS — Z6834 Body mass index (BMI) 34.0-34.9, adult: Secondary | ICD-10-CM

## 2021-07-27 DIAGNOSIS — E569 Vitamin deficiency, unspecified: Secondary | ICD-10-CM

## 2021-07-27 NOTE — Progress Notes (Signed)
Chief Complaint:   OBESITY Holly Daniels is here to discuss her progress with her obesity treatment plan along with follow-up of her obesity related diagnoses. Holly Daniels is on keeping a food journal and adhering to recommended goals of 1300-1500 calories and 85+ grams of protein daily and states she is following her eating plan approximately 75% of the time. Holly Daniels states she is doing 0 minutes 0 times per week.  Today's visit was #: 2 Starting weight: 203 lbs Starting date: 06/22/2021 Today's weight: 200 lbs Today's date: 07/27/2021 Total lbs lost to date: 3 Total lbs lost since last in-office visit: 3  Interim History: Holly Daniels is down 3 lbs since her initial visit. She did not find the plan difficult.  Subjective:   1. Vitamin deficiency Holly Daniels is taking high dose Vit D, and her recent Vit D level is 15.4.  2. Elevated hemoglobin (HCC) Holly Daniels's hemoglobin is elevated at 16.2.  3. Elevated hematocrit Holly Daniels's hematocrit is elevated at 47.9.  4. Type 2 diabetes mellitus with obesity ([place]) Holly Daniels is taking Trulicity, Synjardy, Humalog, and she is taking Lantus in the morning 50 units now. She was taking 50 units at night of Lantus. Her A1c is 7.0 and insulin 4.6. She notes a low blood sugar of 55 for 3 days.  Assessment/Plan:   1. Vitamin deficiency Low Vitamin D level contributes to fatigue and are associated with obesity, breast, and colon cancer. Melaney will continue high dose prescription Vitamin D per her primary care physician. She will follow-up for routine testing of Vitamin D, at least 2-3 times per year to avoid over-replacement.   2. Elevated hemoglobin (HCC) We will recheck labs in 6 weeks. Holly Daniels will continue to follow up as directed.  3. Elevated hematocrit We will recheck labs in 6 weeks. Holly Daniels will continue to follow up as directed.  4. Type 2 diabetes mellitus with obesity (Holly Daniels) Holly Daniels will continue the Dexcom for monitoring, eat more in the morning, no regular insulin as  she is to start tapering that Lantus in the morning based on her blood sugar. Good blood sugar control is important to decrease the likelihood of diabetic complications such as nephropathy, neuropathy, limb loss, blindness, coronary artery disease, and death. Intensive lifestyle modification including diet, exercise and weight loss are the first line of treatment for diabetes.   5. Obesity with current BMI of 34.3 Holly Daniels is currently in the action stage of change. As such, her goal is to continue with weight loss efforts. She has agreed to the Category 3 Plan, keeping a food journal and adhering to recommended goals of 1300-1500 calories and 80+ grams of protein daily, and/or the [place].   Intentional eating was discussed. I reviewed labs from 06/22/2021 with the patient today.   Exercise goals: Stretching for now.  Behavioral modification strategies: increasing lean protein intake, decreasing simple carbohydrates, increasing vegetables, and increasing water intake.  Holly Daniels has agreed to follow-up with our clinic in 2 weeks with Dr. Jearld Shines. She was informed of the importance of frequent follow-up visits to maximize her success with intensive lifestyle modifications for her multiple health conditions.   Objective:   Blood pressure 124/80, pulse 72, temperature 98.2 F (36.8 C), height 5\' 4"  (1.626 m), weight 200 lb (90.7 kg), SpO2 100 %. Body mass index is 34.33 kg/m.  General: Cooperative, alert, well developed, in no acute distress. HEENT: Conjunctivae and lids unremarkable. Cardiovascular: Regular rhythm.  Lungs: Normal work of breathing. Neurologic: No focal deficits.   Lab  Results  Component Value Date   CREATININE 1.04 (H) 06/22/2021   BUN 20 06/22/2021   NA 142 06/22/2021   K 4.3 06/22/2021   CL 102 06/22/2021   CO2 21 06/22/2021   Lab Results  Component Value Date   ALT 13 06/22/2021   AST 15 06/22/2021   ALKPHOS 86 06/22/2021   BILITOT 0.3 06/22/2021   Lab  Results  Component Value Date   HGBA1C 7.0 (H) 06/22/2021   HGBA1C 9.1 (H) 12/05/2015   No results found for: INSULIN Lab Results  Component Value Date   TSH 1.070 06/22/2021   Lab Results  Component Value Date   CHOL 129 06/22/2021   HDL 33 (L) 06/22/2021   LDLCALC 73 06/22/2021   TRIG 130 06/22/2021   CHOLHDL 5.5 12/05/2015   Lab Results  Component Value Date   VD25OH 15.4 (L) 06/22/2021   Lab Results  Component Value Date   WBC 9.4 06/22/2021   HGB 16.2 (H) 06/22/2021   HCT 47.9 (H) 06/22/2021   MCV 89 06/22/2021   PLT 183 06/22/2021   No results found for: IRON, TIBC, FERRITIN  Attestation Statements:   Reviewed by clinician on day of visit: allergies, medications, problem list, medical history, surgical history, family history, social history, and previous encounter notes.   Wilhemena Durie, am acting as Location manager for [employer], DO.  I have reviewed the above documentation for accuracy and completeness, and I agree with the above. Jearld Lesch, DO

## 2021-07-28 ENCOUNTER — Encounter (INDEPENDENT_AMBULATORY_CARE_PROVIDER_SITE_OTHER): Payer: Self-pay | Admitting: Bariatrics

## 2021-08-04 DIAGNOSIS — Z794 Long term (current) use of insulin: Secondary | ICD-10-CM | POA: Diagnosis not present

## 2021-08-04 DIAGNOSIS — E1165 Type 2 diabetes mellitus with hyperglycemia: Secondary | ICD-10-CM | POA: Diagnosis not present

## 2021-08-08 DIAGNOSIS — E1165 Type 2 diabetes mellitus with hyperglycemia: Secondary | ICD-10-CM | POA: Diagnosis not present

## 2021-08-10 ENCOUNTER — Other Ambulatory Visit: Payer: Self-pay

## 2021-08-10 ENCOUNTER — Encounter (INDEPENDENT_AMBULATORY_CARE_PROVIDER_SITE_OTHER): Payer: Self-pay | Admitting: Bariatrics

## 2021-08-10 ENCOUNTER — Ambulatory Visit (INDEPENDENT_AMBULATORY_CARE_PROVIDER_SITE_OTHER): Payer: Medicare Other | Admitting: Bariatrics

## 2021-08-10 VITALS — BP 124/80 | HR 75 | Temp 98.1°F | Ht 64.0 in | Wt 198.0 lb

## 2021-08-10 DIAGNOSIS — E785 Hyperlipidemia, unspecified: Secondary | ICD-10-CM

## 2021-08-10 DIAGNOSIS — Z6834 Body mass index (BMI) 34.0-34.9, adult: Secondary | ICD-10-CM

## 2021-08-10 DIAGNOSIS — E1169 Type 2 diabetes mellitus with other specified complication: Secondary | ICD-10-CM | POA: Diagnosis not present

## 2021-08-10 DIAGNOSIS — E669 Obesity, unspecified: Secondary | ICD-10-CM | POA: Diagnosis not present

## 2021-08-14 ENCOUNTER — Encounter (INDEPENDENT_AMBULATORY_CARE_PROVIDER_SITE_OTHER): Payer: Self-pay | Admitting: Bariatrics

## 2021-08-14 NOTE — Progress Notes (Signed)
Chief Complaint:   OBESITY Holly Daniels is here to discuss her progress with her obesity treatment plan along with follow-up of her obesity related diagnoses. Holly Daniels is on the Category 3 Plan and states she is following her eating plan approximately 78% of the time. Holly Daniels states she is doing 0 minutes 0 times per week.  Today's visit was #: 3 Starting weight: 203 lbs Starting date: 06/22/2021 Today's weight: 198 lbs Today's date: 08/10/2021 Total lbs lost to date: 5 lbs Total lbs lost since last in-office visit: 2 lbs  Interim History: Holly Daniels is down another 2 lbs since her last visit. She is getting in adequate water intake.  Subjective:   1. Type 2 diabetes mellitus with obesity (Holly Daniels) Holly Daniels is currently taking Trulicity, Lantus, Synjardy and Lantus.  Her last fasting blood sugar was 90- but some of her blood sugars have been low  in the 40-50s mainly in the afternoons. She has stopped her Humalog but is currently taking Lantus 50 mg bid.   2. Hyperlipidemia associated with type 2 diabetes mellitus (Holly Daniels) Holly Daniels is taking Crestor currently.  Assessment/Plan:   1. Type 2 diabetes mellitus with obesity (Holly Daniels) Holly Daniels will continue her medications. She has stopped Humalog. She has low blood sugar in the afternoon. She will adjust her Lantus insulin to  40 units  in the a.m. and  45 mg in the p.m. until she talks to her doctor. She was notified that she could always call our office. Good blood sugar control is important to decrease the likelihood of diabetic complications such as nephropathy, neuropathy, limb loss, blindness, coronary artery disease, and death. Intensive lifestyle modification including diet, exercise and weight loss are the first line of treatment for diabetes.   2. Hyperlipidemia associated with type 2 diabetes mellitus (Holly Daniels) Cardiovascular risk and specific lipid/LDL goals reviewed.  We discussed several lifestyle modifications today and Holly Daniels will continue to work on diet,  exercise and weight loss efforts. Holly Daniels will continue her medications. Orders and follow up as documented in patient record.   Counseling Intensive lifestyle modifications are the first line treatment for this issue. Dietary changes: Increase soluble fiber. Decrease simple carbohydrates. Exercise changes: Moderate to vigorous-intensity aerobic activity 150 minutes per week if tolerated. Lipid-lowering medications: see documented in medical record.   3. Obesity with current BMI of 34 Holly Daniels is currently in the action stage of change. As such, her goal is to continue with weight loss efforts. She has agreed to the Category 3 Plan.   Holly Daniels will continue meal planning and she will continue intentional eating. Strategies for the holidays were provided.  Exercise goals: No exercise has been prescribed at this time.  Behavioral modification strategies: increasing lean protein intake, decreasing simple carbohydrates, increasing vegetables, increasing water intake, decreasing eating out, no skipping meals, meal planning and cooking strategies, keeping healthy foods in the home, and planning for success.  Holly Daniels has agreed to follow-up with our clinic in 2-3 weeks. She was informed of the importance of frequent follow-up visits to maximize her success with intensive lifestyle modifications for her multiple health conditions.   Objective:   Blood pressure 124/80, pulse 75, temperature 98.1 F (36.7 C), height 5\' 4"  (1.626 m), weight 198 lb (89.8 kg), SpO2 99 %. Body mass index is 33.99 kg/m.  General: Cooperative, alert, well developed, in no acute distress. HEENT: Conjunctivae and lids unremarkable. Cardiovascular: Regular rhythm.  Lungs: Normal work of breathing. Neurologic: No focal deficits.   Lab Results  Component  Value Date   CREATININE 1.04 (H) 06/22/2021   BUN 20 06/22/2021   NA 142 06/22/2021   K 4.3 06/22/2021   CL 102 06/22/2021   CO2 21 06/22/2021   Lab Results  Component  Value Date   ALT 13 06/22/2021   AST 15 06/22/2021   ALKPHOS 86 06/22/2021   BILITOT 0.3 06/22/2021   Lab Results  Component Value Date   HGBA1C 7.0 (H) 06/22/2021   HGBA1C 9.1 (H) 12/05/2015   No results found for: INSULIN Lab Results  Component Value Date   TSH 1.070 06/22/2021   Lab Results  Component Value Date   CHOL 129 06/22/2021   HDL 33 (L) 06/22/2021   LDLCALC 73 06/22/2021   TRIG 130 06/22/2021   CHOLHDL 5.5 12/05/2015   Lab Results  Component Value Date   VD25OH 15.4 (L) 06/22/2021   Lab Results  Component Value Date   WBC 9.4 06/22/2021   HGB 16.2 (H) 06/22/2021   HCT 47.9 (H) 06/22/2021   MCV 89 06/22/2021   PLT 183 06/22/2021   No results found for: IRON, TIBC, FERRITIN  Attestation Statements:   Reviewed by clinician on day of visit: allergies, medications, problem list, medical history, surgical history, family history, social history, and previous encounter notes.  I, Lizbeth Bark, RMA, am acting as Location manager for [employer], DO.   I have reviewed the above documentation for accuracy and completeness, and I agree with the above. Jearld Lesch, DO

## 2021-08-18 ENCOUNTER — Other Ambulatory Visit: Payer: Self-pay | Admitting: Internal Medicine

## 2021-08-18 DIAGNOSIS — Z13820 Encounter for screening for osteoporosis: Secondary | ICD-10-CM

## 2021-08-25 DIAGNOSIS — J019 Acute sinusitis, unspecified: Secondary | ICD-10-CM | POA: Diagnosis not present

## 2021-08-25 DIAGNOSIS — G4733 Obstructive sleep apnea (adult) (pediatric): Secondary | ICD-10-CM | POA: Diagnosis not present

## 2021-08-25 DIAGNOSIS — R059 Cough, unspecified: Secondary | ICD-10-CM | POA: Diagnosis not present

## 2021-08-28 ENCOUNTER — Other Ambulatory Visit: Payer: Self-pay

## 2021-08-28 ENCOUNTER — Ambulatory Visit (INDEPENDENT_AMBULATORY_CARE_PROVIDER_SITE_OTHER): Payer: Medicare Other | Admitting: Family Medicine

## 2021-08-28 ENCOUNTER — Encounter (INDEPENDENT_AMBULATORY_CARE_PROVIDER_SITE_OTHER): Payer: Self-pay | Admitting: Family Medicine

## 2021-08-28 VITALS — BP 117/71 | HR 73 | Temp 98.7°F | Ht 64.0 in | Wt 194.0 lb

## 2021-08-28 DIAGNOSIS — E7849 Other hyperlipidemia: Secondary | ICD-10-CM

## 2021-08-28 DIAGNOSIS — Z794 Long term (current) use of insulin: Secondary | ICD-10-CM

## 2021-08-28 DIAGNOSIS — E1165 Type 2 diabetes mellitus with hyperglycemia: Secondary | ICD-10-CM | POA: Diagnosis not present

## 2021-08-28 DIAGNOSIS — Z6834 Body mass index (BMI) 34.0-34.9, adult: Secondary | ICD-10-CM

## 2021-08-28 DIAGNOSIS — E669 Obesity, unspecified: Secondary | ICD-10-CM | POA: Diagnosis not present

## 2021-08-28 NOTE — Progress Notes (Signed)
Chief Complaint:   OBESITY Evoleth is here to discuss her progress with her obesity treatment plan along with follow-up of her obesity related diagnoses. Llana is on the Category 3 Plan and states she is following her eating plan approximately 70% of the time. Azaylah states she is doing back exercises 7 times per week.   Today's visit was #: 4 Starting weight: 203 lbs Starting date: 06/22/2021 Today's weight: 194 lbs Today's date: 08/28/2021 Total lbs lost to date: 9 Total lbs lost since last in-office visit: 4  Interim History: Kelis continues to do well with weight loss. She is limited in exercise due to back pain. Her hunger is controlled, but she struggles  to eat all of the protein on her plan.  Subjective:   1. Type 2 diabetes mellitus with hyperglycemia, with long-term current use of insulin (HCC) Shanitra is on [place] but she is not on insulin anymore. She has a Dexcom and her BGs ranges between 85-150. She denies hypoglycemia since discontinuing insulin.  2. Other hyperlipidemia Doninique is on Crestor but Vytorin is still on her medication list. She is not on this any longer, but she is doing  well with diet and exercise.  Assessment/Plan:   1. Type 2 diabetes mellitus with hyperglycemia, with long-term current use of insulin (HCC) Sharetha will continue with her diet and exercise, and we will recheck labs in 1 month. Good blood sugar control is important to decrease the likelihood of diabetic complications such as nephropathy, neuropathy, limb loss, blindness, coronary artery disease, and death. Intensive lifestyle modification including diet, exercise and weight loss are the first line of treatment for diabetes.   2. Other hyperlipidemia Cardiovascular risk and specific lipid/LDL goals reviewed. We discussed several lifestyle modifications today. Bobi agreed to discontinue Vytorin, and will continue Crestor and her diet. We will recheck labs in 1 month. Orders and  follow up as documented in patient record.   3. Obesity BMI today is 31 Kynzi is currently in the action stage of change. As such, her goal is to continue with weight loss efforts. She has agreed to the Category 3 Plan.   Lean meat equivalents were discussed.  Exercise goals: As is.  Behavioral modification strategies: increasing lean protein intake and meal planning and cooking strategies.  Jolaine has agreed to follow-up with our clinic in 2 to 3 weeks. She was informed of the importance of frequent follow-up visits to maximize her success with intensive lifestyle modifications for her multiple health conditions.   Objective:   Blood pressure 117/71, pulse 73, temperature 98.7 F (37.1 C), height 5\' 4"  (1.626 m), weight 194 lb (88 kg), SpO2 100 %. Body mass index is 33.3 kg/m.  General: Cooperative, alert, well developed, in no acute distress. HEENT: Conjunctivae and lids unremarkable. Cardiovascular: Regular rhythm.  Lungs: Normal work of breathing. Neurologic: No focal deficits.   Lab Results  Component Value Date   CREATININE 1.04 (H) 06/22/2021   BUN 20 06/22/2021   NA 142 06/22/2021   K 4.3 06/22/2021   CL 102 06/22/2021   CO2 21 06/22/2021   Lab Results  Component Value Date   ALT 13 06/22/2021   AST 15 06/22/2021   ALKPHOS 86 06/22/2021   BILITOT 0.3 06/22/2021   Lab Results  Component Value Date   HGBA1C 7.0 (H) 06/22/2021   HGBA1C 9.1 (H) 12/05/2015   No results found for: INSULIN Lab Results  Component Value Date   TSH 1.070 06/22/2021  Lab Results  Component Value Date   CHOL 129 06/22/2021   HDL 33 (L) 06/22/2021   LDLCALC 73 06/22/2021   TRIG 130 06/22/2021   CHOLHDL 5.5 12/05/2015   Lab Results  Component Value Date   VD25OH 15.4 (L) 06/22/2021   Lab Results  Component Value Date   WBC 9.4 06/22/2021   HGB 16.2 (H) 06/22/2021   HCT 47.9 (H) 06/22/2021   MCV 89 06/22/2021   PLT 183 06/22/2021   No results found for: IRON, TIBC,  FERRITIN  Obesity Behavioral Intervention:   Approximately 15 minutes were spent on the discussion below.  ASK: We discussed the diagnosis of obesity with Rodena Piety today and Jeronda agreed to give [place] permission to discuss obesity behavioral modification therapy today.  ASSESS: Marlet has the diagnosis of obesity and her BMI today is 33.4. Laelia is in the action stage of change.   ADVISE: Diondra was educated on the multiple health risks of obesity as well as the benefit of weight loss to improve her health. She was advised of the need for long term treatment and the importance of lifestyle modifications to improve her current health and to decrease her risk of future health problems.  AGREE: Multiple dietary modification options and treatment options were discussed and Mikeyla agreed to follow the recommendations documented in the above note.  ARRANGE: Bradee was educated on the importance of frequent visits to treat obesity as outlined per CMS and USPSTF guidelines and agreed to schedule her next follow up appointment today.  Attestation Statements:   Reviewed by clinician on day of visit: allergies, medications, problem list, medical history, surgical history, family history, social history, and previous encounter notes.   I, Trixie Dredge, am acting as transcriptionist for Dennard Nip, MD.  I have reviewed the above documentation for accuracy and completeness, and I agree with the above. -  Dennard Nip, MD

## 2021-09-04 DIAGNOSIS — E1165 Type 2 diabetes mellitus with hyperglycemia: Secondary | ICD-10-CM | POA: Diagnosis not present

## 2021-09-12 ENCOUNTER — Encounter (INDEPENDENT_AMBULATORY_CARE_PROVIDER_SITE_OTHER): Payer: Self-pay | Admitting: Family Medicine

## 2021-09-12 ENCOUNTER — Ambulatory Visit (INDEPENDENT_AMBULATORY_CARE_PROVIDER_SITE_OTHER): Payer: Medicare Other | Admitting: Family Medicine

## 2021-09-12 ENCOUNTER — Other Ambulatory Visit: Payer: Self-pay

## 2021-09-12 VITALS — BP 135/81 | HR 73 | Temp 98.3°F | Ht 64.0 in | Wt 192.0 lb

## 2021-09-12 DIAGNOSIS — Z794 Long term (current) use of insulin: Secondary | ICD-10-CM

## 2021-09-12 DIAGNOSIS — E669 Obesity, unspecified: Secondary | ICD-10-CM | POA: Diagnosis not present

## 2021-09-12 DIAGNOSIS — Z6834 Body mass index (BMI) 34.0-34.9, adult: Secondary | ICD-10-CM

## 2021-09-12 DIAGNOSIS — E1165 Type 2 diabetes mellitus with hyperglycemia: Secondary | ICD-10-CM | POA: Diagnosis not present

## 2021-09-13 NOTE — Progress Notes (Signed)
Chief Complaint:   OBESITY Holly Daniels is here to discuss her progress with her obesity treatment plan along with follow-up of her obesity related diagnoses. Holly Daniels is on the Category 3 Plan and states she is following her eating plan approximately 70% of the time. Holly Daniels states she is doing sit down yoga.   Today's visit was #: 5 Starting weight: 203 lbs Starting date: 06/22/2021 Today's weight: 192 lbs Today's date: 09/12/2021 Total lbs lost to date: 11 Total lbs lost since last in-office visit: 2  Interim History: Holly Daniels continues to do very well with weight loss. She has started to do some chair yoga in addition to her back exercises, and she is feeling better overall.  Subjective:   1. Type 2 diabetes mellitus with hyperglycemia, with long-term current use of insulin ([place]) Holly Daniels had a Dexcom check and she is at goal at 96%. She is no longer on insulin and she is having no hypoglycemia. She is still on Holly Daniels.   Assessment/Plan:   1. Type 2 diabetes mellitus with hyperglycemia, with long-term current use of insulin (Holly Daniels) Holly Daniels agreed to discontinue insulin glargine. She will continue with diet, exercise, and her medications, and may be able to reduce other medications in the future. Good blood sugar control is important to decrease the likelihood of diabetic complications such as nephropathy, neuropathy, limb loss, blindness, coronary artery disease, and death. Intensive lifestyle modification including diet, exercise and weight loss are the first line of treatment for diabetes.   2. Obesity BMI today is 33.1 Holly Daniels is currently in the action stage of change. As such, her goal is to continue with weight loss efforts. She has agreed to the Category 3 Plan.   Exercise goals: As is.  Behavioral modification strategies: better snacking choices and holiday eating strategies .  Holly Daniels has agreed to follow-up with our clinic in 3 weeks. She was informed of the importance of  frequent follow-up visits to maximize her success with intensive lifestyle modifications for her multiple health conditions.   Objective:   Blood pressure 135/81, pulse 73, temperature 98.3 F (36.8 C), height 5\' 4"  (1.626 m), weight 192 lb (87.1 kg), SpO2 100 %. Body mass index is 32.96 kg/m.  General: Cooperative, alert, well developed, in no acute distress. HEENT: Conjunctivae and lids unremarkable. Cardiovascular: Regular rhythm.  Lungs: Normal work of breathing. Neurologic: No focal deficits.   Lab Results  Component Value Date   CREATININE 1.04 (H) 06/22/2021   BUN 20 06/22/2021   NA 142 06/22/2021   K 4.3 06/22/2021   CL 102 06/22/2021   CO2 21 06/22/2021   Lab Results  Component Value Date   ALT 13 06/22/2021   AST 15 06/22/2021   ALKPHOS 86 06/22/2021   BILITOT 0.3 06/22/2021   Lab Results  Component Value Date   HGBA1C 7.0 (H) 06/22/2021   HGBA1C 9.1 (H) 12/05/2015   No results found for: INSULIN Lab Results  Component Value Date   TSH 1.070 06/22/2021   Lab Results  Component Value Date   CHOL 129 06/22/2021   HDL 33 (L) 06/22/2021   LDLCALC 73 06/22/2021   TRIG 130 06/22/2021   CHOLHDL 5.5 12/05/2015   Lab Results  Component Value Date   VD25OH 15.4 (L) 06/22/2021   Lab Results  Component Value Date   WBC 9.4 06/22/2021   HGB 16.2 (H) 06/22/2021   HCT 47.9 (H) 06/22/2021   MCV 89 06/22/2021   PLT 183 06/22/2021  No results found for: IRON, TIBC, FERRITIN  Attestation Statements:   Reviewed by clinician on day of visit: allergies, medications, problem list, medical history, surgical history, family history, social history, and previous encounter notes.  Time spent on visit including pre-visit chart review and post-visit care and charting was 33 minutes.    I, Trixie Dredge, am acting as transcriptionist for Dennard Nip, MD.  I have reviewed the above documentation for accuracy and completeness, and I agree with the above. -  Dennard Nip, MD

## 2021-10-03 ENCOUNTER — Other Ambulatory Visit: Payer: Self-pay

## 2021-10-03 ENCOUNTER — Ambulatory Visit (INDEPENDENT_AMBULATORY_CARE_PROVIDER_SITE_OTHER): Payer: Medicare Other | Admitting: Family Medicine

## 2021-10-03 ENCOUNTER — Encounter (INDEPENDENT_AMBULATORY_CARE_PROVIDER_SITE_OTHER): Payer: Self-pay | Admitting: Family Medicine

## 2021-10-03 VITALS — BP 110/70 | HR 80 | Temp 98.8°F | Ht 64.0 in | Wt 189.0 lb

## 2021-10-03 DIAGNOSIS — Z6832 Body mass index (BMI) 32.0-32.9, adult: Secondary | ICD-10-CM

## 2021-10-03 DIAGNOSIS — Z794 Long term (current) use of insulin: Secondary | ICD-10-CM

## 2021-10-03 DIAGNOSIS — E669 Obesity, unspecified: Secondary | ICD-10-CM

## 2021-10-03 DIAGNOSIS — E1165 Type 2 diabetes mellitus with hyperglycemia: Secondary | ICD-10-CM

## 2021-10-04 NOTE — Progress Notes (Signed)
Chief Complaint:   OBESITY Holly Daniels is here to discuss her progress with her obesity treatment plan along with follow-up of her obesity related diagnoses. Holly Daniels is on the Category 3 Plan and states she is following her eating plan approximately 50% of the time. [name] states she is doing 0 minutes 0 times per week.  Today's visit was #: 6 Starting weight: 203 lbs Starting date: 06/22/2021 Today's weight: 189 lbs Today's date: 10/03/2021 Total lbs lost to date: 14 Total lbs lost since last in-office visit: 3  Interim History: Holly Daniels has done well with maintaining her weight loss and even losing another 3 lbs. Her hunger is better controlled and she is working on meal planning.  Subjective:   1. Type 2 diabetes mellitus with hyperglycemia, with long-term current use of insulin (HCC) Holly Daniels is having less hypoglycemia with a low of 75. She wears a Dexcom and will have this checked soon.  Assessment/Plan:   1. Type 2 diabetes mellitus with hyperglycemia, with long-term current use of insulin (HCC) Holly Daniels will continue with diet, exercise, and her medications, and will continue to monitor for hypoglycemia. Good blood sugar control is important to decrease the likelihood of diabetic complications such as nephropathy, neuropathy, limb loss, blindness, coronary artery disease, and death. Intensive lifestyle modification including diet, exercise and weight loss are the first line of treatment for diabetes.   2. Obesity with current BMI of 32.4 Holly Daniels is currently in the action stage of change. As such, her goal is to continue with weight loss efforts. She has agreed to the Category 3 Plan.   Behavioral modification strategies: increasing lean protein intake.  Holly Daniels has agreed to follow-up with our clinic in 2 to 3 weeks. She was informed of the importance of frequent follow-up visits to maximize her success with intensive lifestyle modifications for her multiple health conditions.   Objective:    Blood pressure 110/70, pulse 80, temperature 98.8 F (37.1 C), height 5\' 4"  (1.626 m), weight 189 lb (85.7 kg), SpO2 99 %. Body mass index is 32.44 kg/m.  General: Cooperative, alert, well developed, in no acute distress. HEENT: Conjunctivae and lids unremarkable. Cardiovascular: Regular rhythm.  Lungs: Normal work of breathing. Neurologic: No focal deficits.   Lab Results  Component Value Date   CREATININE 1.04 (H) 06/22/2021   BUN 20 06/22/2021   NA 142 06/22/2021   K 4.3 06/22/2021   CL 102 06/22/2021   CO2 21 06/22/2021   Lab Results  Component Value Date   ALT 13 06/22/2021   AST 15 06/22/2021   ALKPHOS 86 06/22/2021   BILITOT 0.3 06/22/2021   Lab Results  Component Value Date   HGBA1C 7.0 (H) 06/22/2021   HGBA1C 9.1 (H) 12/05/2015   No results found for: INSULIN Lab Results  Component Value Date   TSH 1.070 06/22/2021   Lab Results  Component Value Date   CHOL 129 06/22/2021   HDL 33 (L) 06/22/2021   LDLCALC 73 06/22/2021   TRIG 130 06/22/2021   CHOLHDL 5.5 12/05/2015   Lab Results  Component Value Date   VD25OH 15.4 (L) 06/22/2021   Lab Results  Component Value Date   WBC 9.4 06/22/2021   HGB 16.2 (H) 06/22/2021   HCT 47.9 (H) 06/22/2021   MCV 89 06/22/2021   PLT 183 06/22/2021   No results found for: IRON, TIBC, FERRITIN  Attestation Statements:   Reviewed by clinician on day of visit: allergies, medications, problem list, medical history, surgical history,  family history, social history, and previous encounter notes.  Time spent on visit including pre-visit chart review and post-visit care and charting was 32 minutes.    I, Trixie Dredge, am acting as transcriptionist for Dennard Nip, MD.  I have reviewed the above documentation for accuracy and completeness, and I agree with the above. -  Dennard Nip, MD

## 2021-10-05 DIAGNOSIS — E1165 Type 2 diabetes mellitus with hyperglycemia: Secondary | ICD-10-CM | POA: Diagnosis not present

## 2021-10-18 DIAGNOSIS — I10 Essential (primary) hypertension: Secondary | ICD-10-CM | POA: Diagnosis not present

## 2021-10-18 DIAGNOSIS — Z13820 Encounter for screening for osteoporosis: Secondary | ICD-10-CM | POA: Diagnosis not present

## 2021-10-18 DIAGNOSIS — E1121 Type 2 diabetes mellitus with diabetic nephropathy: Secondary | ICD-10-CM | POA: Diagnosis not present

## 2021-10-18 DIAGNOSIS — E1122 Type 2 diabetes mellitus with diabetic chronic kidney disease: Secondary | ICD-10-CM | POA: Diagnosis not present

## 2021-10-18 DIAGNOSIS — G4733 Obstructive sleep apnea (adult) (pediatric): Secondary | ICD-10-CM | POA: Diagnosis not present

## 2021-10-18 DIAGNOSIS — F17290 Nicotine dependence, other tobacco product, uncomplicated: Secondary | ICD-10-CM | POA: Diagnosis not present

## 2021-10-18 DIAGNOSIS — E1165 Type 2 diabetes mellitus with hyperglycemia: Secondary | ICD-10-CM | POA: Diagnosis not present

## 2021-10-18 DIAGNOSIS — E782 Mixed hyperlipidemia: Secondary | ICD-10-CM | POA: Diagnosis not present

## 2021-10-18 DIAGNOSIS — Z1211 Encounter for screening for malignant neoplasm of colon: Secondary | ICD-10-CM | POA: Diagnosis not present

## 2021-10-18 DIAGNOSIS — Z1231 Encounter for screening mammogram for malignant neoplasm of breast: Secondary | ICD-10-CM | POA: Diagnosis not present

## 2021-10-18 DIAGNOSIS — E1142 Type 2 diabetes mellitus with diabetic polyneuropathy: Secondary | ICD-10-CM | POA: Diagnosis not present

## 2021-10-23 ENCOUNTER — Ambulatory Visit (INDEPENDENT_AMBULATORY_CARE_PROVIDER_SITE_OTHER): Payer: Medicare Other | Admitting: Family Medicine

## 2021-10-23 ENCOUNTER — Encounter (INDEPENDENT_AMBULATORY_CARE_PROVIDER_SITE_OTHER): Payer: Self-pay | Admitting: Family Medicine

## 2021-10-23 ENCOUNTER — Other Ambulatory Visit: Payer: Self-pay

## 2021-10-23 VITALS — BP 130/69 | HR 88 | Temp 98.3°F | Ht 64.0 in | Wt 185.0 lb

## 2021-10-23 DIAGNOSIS — Z6831 Body mass index (BMI) 31.0-31.9, adult: Secondary | ICD-10-CM

## 2021-10-23 DIAGNOSIS — K5909 Other constipation: Secondary | ICD-10-CM | POA: Diagnosis not present

## 2021-10-23 DIAGNOSIS — Z794 Long term (current) use of insulin: Secondary | ICD-10-CM | POA: Diagnosis not present

## 2021-10-23 DIAGNOSIS — E119 Type 2 diabetes mellitus without complications: Secondary | ICD-10-CM | POA: Insufficient documentation

## 2021-10-23 DIAGNOSIS — Z6834 Body mass index (BMI) 34.0-34.9, adult: Secondary | ICD-10-CM

## 2021-10-23 DIAGNOSIS — E1169 Type 2 diabetes mellitus with other specified complication: Secondary | ICD-10-CM | POA: Diagnosis not present

## 2021-10-23 DIAGNOSIS — E669 Obesity, unspecified: Secondary | ICD-10-CM | POA: Diagnosis not present

## 2021-10-23 NOTE — Progress Notes (Signed)
Chief Complaint:   OBESITY Holly Daniels is here to discuss her progress with her obesity treatment plan along with follow-up of her obesity related diagnoses. Holly Daniels is on the Category 3 Plan and states she is following her eating plan approximately 78% of the time. Holly Daniels states she is doing back stretches and peddle bike for 5-7 minutes 3 times per week.  Today's visit was #: 7 Starting weight: 203 lbs Starting date: 06/22/2021 Today's weight: 185 lbs Today's date: 10/23/2021 Total lbs lost to date: 18 Total lbs lost since last in-office visit: 4  Interim History: Holly Daniels continues to do well with weight loss. She has increased her exercise, but she notes increased PM polyphagia. She goes to sleep approximately 6-7 hour after dinner.   Subjective:   1. Other constipation Holly Daniels is not relived by OTC laxatives. Her symptoms are worsening and have bee present for 2 weeks.  2. Type 2 diabetes mellitus with other specified complication, with long-term current use of insulin (Holly Daniels) Holly Daniels is on Dexcom. She notes decreased glucose excursions, and mostly ranging between 115-130. She is on Synjardy BID now.  Assessment/Plan:   1. Other constipation Holly Daniels agreed to start OTC miralax 17 grams daily and increase her water intake. She was informed that a decrease in bowel movement frequency is normal while losing weight, but stools should not be hard or painful. Orders and follow up as documented in patient record.   2. Type 2 diabetes mellitus with other specified complication, with long-term current use of insulin (HCC) Holly Daniels will continue with diet, exercise, and weight loss, and will continue to follow. Good blood sugar control is important to decrease the likelihood of diabetic complications such as nephropathy, neuropathy, limb loss, blindness, coronary artery disease, and death. Intensive lifestyle modification including diet, exercise and weight loss are the first line of treatment for diabetes.    3. Obesity, current BMI 31.8 Holly Daniels is currently in the action stage of change. As such, her goal is to continue with weight loss efforts. She has agreed to the Category 3 Plan.   Holly Daniels is to move her meals around so that she does not has as much time to get hungry between dinner and bedtime.  Exercise goals: As is.  Behavioral modification strategies: increasing lean protein intake, decreasing simple carbohydrates, increasing water intake, increasing high fiber foods, and meal planning and cooking strategies.  Holly Daniels has agreed to follow-up with our clinic in 3 to 4 weeks. She was informed of the importance of frequent follow-up visits to maximize her success with intensive lifestyle modifications for her multiple health conditions.   Objective:   Blood pressure 130/69, pulse 88, temperature 98.3 F (36.8 C), height 5\' 4"  (1.626 m), weight 185 lb (83.9 kg), SpO2 100 %. Body mass index is 31.76 kg/m.  General: Cooperative, alert, well developed, in no acute distress. HEENT: Conjunctivae and lids unremarkable. Cardiovascular: Regular rhythm.  Lungs: Normal work of breathing. Neurologic: No focal deficits.   Lab Results  Component Value Date   CREATININE 1.04 (H) 06/22/2021   BUN 20 06/22/2021   NA 142 06/22/2021   K 4.3 06/22/2021   CL 102 06/22/2021   CO2 21 06/22/2021   Lab Results  Component Value Date   ALT 13 06/22/2021   AST 15 06/22/2021   ALKPHOS 86 06/22/2021   BILITOT 0.3 06/22/2021   Lab Results  Component Value Date   HGBA1C 7.0 (H) 06/22/2021   HGBA1C 9.1 (H) 12/05/2015   No results  found for: INSULIN Lab Results  Component Value Date   TSH 1.070 06/22/2021   Lab Results  Component Value Date   CHOL 129 06/22/2021   HDL 33 (L) 06/22/2021   LDLCALC 73 06/22/2021   TRIG 130 06/22/2021   CHOLHDL 5.5 12/05/2015   Lab Results  Component Value Date   VD25OH 15.4 (L) 06/22/2021   Lab Results  Component Value Date   WBC 9.4 06/22/2021   HGB 16.2  (H) 06/22/2021   HCT 47.9 (H) 06/22/2021   MCV 89 06/22/2021   PLT 183 06/22/2021   No results found for: IRON, TIBC, FERRITIN  Obesity Behavioral Intervention:   Up to 15 minutes were spent on the discussion below.  ASK: We discussed the diagnosis of obesity with Holly Daniels today and Holly Daniels agreed to give [place] permission to discuss obesity behavioral modification therapy today.  ASSESS: Holly Daniels has the diagnosis of obesity and her BMI today is 31.8. Holly Daniels is in the action stage of change.   ADVISE: Holly Daniels was educated on the multiple health risks of obesity as well as the benefit of weight loss to improve her health. She was advised of the need for long term treatment and the importance of lifestyle modifications to improve her current health and to decrease her risk of future health problems.  AGREE: Multiple dietary modification options and treatment options were discussed and Holly Daniels agreed to follow the recommendations documented in the above note.  ARRANGE: Holly Daniels was educated on the importance of frequent visits to treat obesity as outlined per CMS and USPSTF guidelines and agreed to schedule her next follow up appointment today.  Attestation Statements:   Reviewed by clinician on day of visit: allergies, medications, problem list, medical history, surgical history, family history, social history, and previous encounter notes.   I, Trixie Dredge, am acting as transcriptionist for Dennard Nip, MD.  I have reviewed the above documentation for accuracy and completeness, and I agree with the above. -  Dennard Nip, MD

## 2021-11-06 ENCOUNTER — Encounter (INDEPENDENT_AMBULATORY_CARE_PROVIDER_SITE_OTHER): Payer: Self-pay | Admitting: Family Medicine

## 2021-11-06 ENCOUNTER — Other Ambulatory Visit: Payer: Self-pay

## 2021-11-06 ENCOUNTER — Ambulatory Visit (INDEPENDENT_AMBULATORY_CARE_PROVIDER_SITE_OTHER): Payer: Medicare Other | Admitting: Family Medicine

## 2021-11-06 VITALS — BP 123/71 | HR 81 | Temp 98.0°F | Ht 64.0 in | Wt 184.0 lb

## 2021-11-06 DIAGNOSIS — E1169 Type 2 diabetes mellitus with other specified complication: Secondary | ICD-10-CM | POA: Diagnosis not present

## 2021-11-06 DIAGNOSIS — E669 Obesity, unspecified: Secondary | ICD-10-CM | POA: Diagnosis not present

## 2021-11-06 DIAGNOSIS — Z6831 Body mass index (BMI) 31.0-31.9, adult: Secondary | ICD-10-CM

## 2021-11-06 DIAGNOSIS — F3289 Other specified depressive episodes: Secondary | ICD-10-CM | POA: Diagnosis not present

## 2021-11-06 DIAGNOSIS — Z720 Tobacco use: Secondary | ICD-10-CM

## 2021-11-06 DIAGNOSIS — E1165 Type 2 diabetes mellitus with hyperglycemia: Secondary | ICD-10-CM | POA: Diagnosis not present

## 2021-11-06 DIAGNOSIS — E569 Vitamin deficiency, unspecified: Secondary | ICD-10-CM | POA: Diagnosis not present

## 2021-11-06 DIAGNOSIS — Z794 Long term (current) use of insulin: Secondary | ICD-10-CM

## 2021-11-07 MED ORDER — BUPROPION HCL ER (SR) 150 MG PO TB12: 150.0000 mg | ORAL_TABLET | Freq: Every day | ORAL | 0 refills | Status: DC

## 2021-11-07 NOTE — Progress Notes (Signed)
Chief Complaint:   OBESITY Holly Daniels is here to discuss her progress with her obesity treatment plan along with follow-up of her obesity related diagnoses. Holly Daniels is on the Category 3 Plan and states she is following her eating plan approximately 70-75% of the time. Holly Daniels states she is peddling for 34 minutes 2-3 times per week.  Today's visit was #: 8 Starting weight: 203 lbs Starting date: 06/22/2021 Today's weight: 184 lbs Today's date: 11/06/2021 Total lbs lost to date: 19 Total lbs lost since last in-office visit: 1  Interim History: Holly Daniels has done well with 1 lb weight loss since her last visit. She is struggling with stress eating late at night. She is the only caregiver for her husband, and she is experiencing caregiver fatigue. She is stress and comfort eating. She has 2 daughter that have 2 full time jobs.  Subjective:   1. Type 2 diabetes mellitus with other specified complication, with long-term current use of insulin (HCC) Holly Daniels's last A1c was 7.0. She is taking Trulicity, Synjardy, and insulin.  2. Tobacco use Holly Daniels has been smoking since 66 years old. She would like to be able to cut back and then stop.   3. Vitamin deficiency Holly Daniels took Vit D prescribed by her primary care physician. She stopped due to side effects of vomiting. She is not taking Vit D OTC.  4. Other depression with emotional eating Holly Daniels is snacking late at night, and she is stress eating more comfort food. She is stressed with caring for her husband, and bills without help.   Assessment/Plan:   1. Type 2 diabetes mellitus with other specified complication, with long-term current use of insulin (HCC) Holly Daniels is to follow up with her primary care physician. She will continue her medications as directed, and will continue working on dietary changes, exercise, ans weight loss.  2. Tobacco use Holly Daniels will start Wellbutrin SR and will we will follow up at her next office visit.  3. Vitamin  deficiency Holly Daniels is to try Vitamin D 2,000 IU OTC with food. Side effects were discussed. We will continue to monitor.  4. Other depression with emotional eating Holly Daniels agreed to start Wellbutrin SR 150 mg q AM with no refills. Side effects were discussed. She has taken this in the past with no problems. She took it at night but not in the morning.  - buPROPion (WELLBUTRIN SR) 150 MG 12 hr tablet; Take 1 tablet (150 mg total) by mouth daily. Take in the AM.  Dispense: 30 tablet; Refill: 0  5. Obesity with current BMI of 31.6 Holly Daniels is currently in the action stage of change. As such, her goal is to continue with weight loss efforts. She has agreed to the Category 3 Plan.   Exercise goals: As is.  Behavioral modification strategies: increasing lean protein intake, ways to avoid night time snacking, better snacking choices, and emotional eating strategies.  Holly Daniels has agreed to follow-up with our clinic in 3 weeks. She was informed of the importance of frequent follow-up visits to maximize her success with intensive lifestyle modifications for her multiple health conditions.   Objective:   Blood pressure 123/71, pulse 81, temperature 98 F (36.7 C), height 5\' 4"  (1.626 m), weight 184 lb (83.5 kg), SpO2 99 %. Body mass index is 31.58 kg/m.  General: Cooperative, alert, well developed, in no acute distress. HEENT: Conjunctivae and lids unremarkable. Cardiovascular: Regular rhythm.  Lungs: Normal work of breathing. Neurologic: No focal deficits.   Lab Results  Component Value Date   CREATININE 1.04 (H) 06/22/2021   BUN 20 06/22/2021   NA 142 06/22/2021   K 4.3 06/22/2021   CL 102 06/22/2021   CO2 21 06/22/2021   Lab Results  Component Value Date   ALT 13 06/22/2021   AST 15 06/22/2021   ALKPHOS 86 06/22/2021   BILITOT 0.3 06/22/2021   Lab Results  Component Value Date   HGBA1C 7.0 (H) 06/22/2021   HGBA1C 9.1 (H) 12/05/2015   No results found for: INSULIN Lab Results   Component Value Date   TSH 1.070 06/22/2021   Lab Results  Component Value Date   CHOL 129 06/22/2021   HDL 33 (L) 06/22/2021   LDLCALC 73 06/22/2021   TRIG 130 06/22/2021   CHOLHDL 5.5 12/05/2015   Lab Results  Component Value Date   VD25OH 15.4 (L) 06/22/2021   Lab Results  Component Value Date   WBC 9.4 06/22/2021   HGB 16.2 (H) 06/22/2021   HCT 47.9 (H) 06/22/2021   MCV 89 06/22/2021   PLT 183 06/22/2021   No results found for: IRON, TIBC, FERRITIN  Obesity Behavioral Intervention:   Approximately 15 minutes were spent on the discussion below.  ASK: We discussed the diagnosis of obesity with Rodena Piety today and Jaelle agreed to give [place] permission to discuss obesity behavioral modification therapy today.  ASSESS: Lyris has the diagnosis of obesity and her BMI today is 31.6. Shadell is in the action stage of change.   ADVISE: Artice was educated on the multiple health risks of obesity as well as the benefit of weight loss to improve her health. She was advised of the need for long term treatment and the importance of lifestyle modifications to improve her current health and to decrease her risk of future health problems.  AGREE: Multiple dietary modification options and treatment options were discussed and Audrina agreed to follow the recommendations documented in the above note.  ARRANGE: Saragrace was educated on the importance of frequent visits to treat obesity as outlined per CMS and USPSTF guidelines and agreed to schedule her next follow up appointment today.  Attestation Statements:   Reviewed by clinician on day of visit: allergies, medications, problem list, medical history, surgical history, family history, social history, and previous encounter notes.   I, Trixie Dredge, am acting as transcriptionist for Dennard Nip, MD.  I have reviewed the above documentation for accuracy and completeness, and I agree with the above. -  Dennard Nip, MD

## 2021-11-09 DIAGNOSIS — Z794 Long term (current) use of insulin: Secondary | ICD-10-CM | POA: Diagnosis not present

## 2021-11-09 DIAGNOSIS — E1165 Type 2 diabetes mellitus with hyperglycemia: Secondary | ICD-10-CM | POA: Diagnosis not present

## 2021-11-27 ENCOUNTER — Ambulatory Visit (INDEPENDENT_AMBULATORY_CARE_PROVIDER_SITE_OTHER): Payer: Medicare Other | Admitting: Family Medicine

## 2021-11-27 ENCOUNTER — Encounter (INDEPENDENT_AMBULATORY_CARE_PROVIDER_SITE_OTHER): Payer: Self-pay | Admitting: Family Medicine

## 2021-11-27 ENCOUNTER — Other Ambulatory Visit: Payer: Self-pay

## 2021-11-27 VITALS — BP 90/60 | HR 92 | Temp 98.2°F | Ht 64.0 in | Wt 181.0 lb

## 2021-11-27 DIAGNOSIS — I10 Essential (primary) hypertension: Secondary | ICD-10-CM | POA: Diagnosis not present

## 2021-11-27 DIAGNOSIS — Z6831 Body mass index (BMI) 31.0-31.9, adult: Secondary | ICD-10-CM

## 2021-11-27 DIAGNOSIS — E669 Obesity, unspecified: Secondary | ICD-10-CM | POA: Diagnosis not present

## 2021-11-28 NOTE — Progress Notes (Signed)
Chief Complaint:   OBESITY Holly Daniels is here to discuss her progress with her obesity treatment plan along with follow-up of her obesity related diagnoses. Holly Daniels is on the Category 3 Plan and states she is following her eating plan approximately 50% of the time. Holly Daniels states she is doing 0 minutes 0 times per week.  Today's visit was #: 9 Starting weight: 203 lbs Starting date: 06/22/2021 Today's weight: 181 lbs Today's date: 11/27/2021 Total lbs lost to date: 22 Total lbs lost since last in-office visit: 3  Interim History: Holly Daniels continues to do well with weight loss on her plan. She had extra temptations since her last visit, but she tried to portion control and water her calories.  Subjective:   1. Essential hypertension [name]'s blood pressure is lower than expected. She is stable on her medications and she does not feel hypotensive.  Assessment/Plan:   1. Essential hypertension [name] is to increase her water intake, continue her medications, but watch for feeling lightheaded or dizzy. We will recheck her blood pressure in 3 weeks.   2. Obesity with current BMI of 31.1 Holly Daniels is currently in the action stage of change. As such, her goal is to continue with weight loss efforts. She has agreed to the Category 3 Plan.   Behavioral modification strategies: increasing lean protein intake and increasing water intake.  Holly Daniels has agreed to follow-up with our clinic in 3 weeks. She was informed of the importance of frequent follow-up visits to maximize her success with intensive lifestyle modifications for her multiple health conditions.   Objective:   Blood pressure 90/60, pulse 92, temperature 98.2 F (36.8 C), height 5\' 4"  (1.626 m), weight 181 lb (82.1 kg), SpO2 100 %. Body mass index is 31.07 kg/m.  General: Cooperative, alert, well developed, in no acute distress. HEENT: Conjunctivae and lids unremarkable. Cardiovascular: Regular rhythm.  Lungs: Normal work of  breathing. Neurologic: No focal deficits.   Lab Results  Component Value Date   CREATININE 1.04 (H) 06/22/2021   BUN 20 06/22/2021   NA 142 06/22/2021   K 4.3 06/22/2021   CL 102 06/22/2021   CO2 21 06/22/2021   Lab Results  Component Value Date   ALT 13 06/22/2021   AST 15 06/22/2021   ALKPHOS 86 06/22/2021   BILITOT 0.3 06/22/2021   Lab Results  Component Value Date   HGBA1C 7.0 (H) 06/22/2021   HGBA1C 9.1 (H) 12/05/2015   No results found for: INSULIN Lab Results  Component Value Date   TSH 1.070 06/22/2021   Lab Results  Component Value Date   CHOL 129 06/22/2021   HDL 33 (L) 06/22/2021   LDLCALC 73 06/22/2021   TRIG 130 06/22/2021   CHOLHDL 5.5 12/05/2015   Lab Results  Component Value Date   VD25OH 15.4 (L) 06/22/2021   Lab Results  Component Value Date   WBC 9.4 06/22/2021   HGB 16.2 (H) 06/22/2021   HCT 47.9 (H) 06/22/2021   MCV 89 06/22/2021   PLT 183 06/22/2021   No results found for: IRON, TIBC, FERRITIN  Attestation Statements:   Reviewed by clinician on day of visit: allergies, medications, problem list, medical history, surgical history, family history, social history, and previous encounter notes.  Time spent on visit including pre-visit chart review and post-visit care and charting was 25 minutes.    I, Trixie Dredge, am acting as transcriptionist for Dennard Nip, MD.  I have reviewed the above documentation for accuracy and completeness, and  I agree with the above. -  Dennard Nip, MD

## 2021-11-30 ENCOUNTER — Encounter (INDEPENDENT_AMBULATORY_CARE_PROVIDER_SITE_OTHER): Payer: Self-pay

## 2021-12-04 DIAGNOSIS — E1165 Type 2 diabetes mellitus with hyperglycemia: Secondary | ICD-10-CM | POA: Diagnosis not present

## 2021-12-11 ENCOUNTER — Encounter (INDEPENDENT_AMBULATORY_CARE_PROVIDER_SITE_OTHER): Payer: Self-pay | Admitting: Family Medicine

## 2021-12-11 ENCOUNTER — Ambulatory Visit (INDEPENDENT_AMBULATORY_CARE_PROVIDER_SITE_OTHER): Payer: Medicare Other | Admitting: Family Medicine

## 2021-12-11 ENCOUNTER — Other Ambulatory Visit: Payer: Self-pay

## 2021-12-11 VITALS — BP 129/71 | HR 89 | Temp 98.3°F | Ht 64.0 in | Wt 178.0 lb

## 2021-12-11 DIAGNOSIS — F3289 Other specified depressive episodes: Secondary | ICD-10-CM

## 2021-12-11 DIAGNOSIS — E669 Obesity, unspecified: Secondary | ICD-10-CM | POA: Diagnosis not present

## 2021-12-11 DIAGNOSIS — Z794 Long term (current) use of insulin: Secondary | ICD-10-CM

## 2021-12-11 DIAGNOSIS — Z6830 Body mass index (BMI) 30.0-30.9, adult: Secondary | ICD-10-CM | POA: Diagnosis not present

## 2021-12-11 DIAGNOSIS — G4733 Obstructive sleep apnea (adult) (pediatric): Secondary | ICD-10-CM | POA: Diagnosis not present

## 2021-12-11 DIAGNOSIS — Z6834 Body mass index (BMI) 34.0-34.9, adult: Secondary | ICD-10-CM

## 2021-12-11 DIAGNOSIS — Z720 Tobacco use: Secondary | ICD-10-CM

## 2021-12-11 DIAGNOSIS — E1169 Type 2 diabetes mellitus with other specified complication: Secondary | ICD-10-CM | POA: Diagnosis not present

## 2021-12-11 DIAGNOSIS — I10 Essential (primary) hypertension: Secondary | ICD-10-CM

## 2021-12-13 NOTE — Progress Notes (Signed)
? ? ? ?Chief Complaint:  ? ?OBESITY ?Holly Daniels is here to discuss her progress with her obesity treatment plan along with follow-up of her obesity related diagnoses. Holly Daniels is on the Category 3 Plan and states she is following her eating plan approximately 70% of the time. Holly Daniels states she is doing 0 minutes 0 times per week. ? ?Today's visit was #: 10 ?Starting weight: 203 lbs ?Starting date: 06/22/2021 ?Today's weight: 178 lbs ?Today's date: 12/11/2021 ?Total lbs lost to date: 25 ?Total lbs lost since last in-office visit: 3 ? ?Interim History: Holly Daniels doing well overall with weight loss. She started back to school and she is majoring in [employer]. ? ?Subjective:  ? ?1. Essential hypertension ?Aundraya's blood pressure looks good today. She is taking Norvasc 5 mg and Micardis 20 mg, and she denies side effects. She has a history of palpitations and she notices palpitations after smoking. She has seen Cardiology years ago. ? ?2. Type 2 diabetes mellitus with other specified complication, with long-term current use of insulin (Holly Daniels) ?Holly Daniels is taking Trulicity, Synjardy, and insulin. Her fasting blood sugars range between 80-120. She denies hypoglycemia.  ? ?3. Tobacco use ?Holly Daniels has been smoking since she was 66 years old. She reports shortness of breath with activity. She is taking Wellbutrin and she is trying to stop. ? ?4. OSA (obstructive sleep apnea) ?Holly Daniels is not currently using CPAP.  ? ?5. Other depression with emotional eating ?Holly Daniels is taking Wellbutrin SR 150 mg. She reports nausea and jittery and she is requesting to stop. ? ?Assessment/Plan:  ? ?1. Essential hypertension ?We will refer Holly Daniels to Cardiology for evaluation. She will continue her medications as directed. ? ?- Ambulatory referral to Cardiology ? ?2. Type 2 diabetes mellitus with other specified complication, with long-term current use of insulin (Holly Daniels) ?Holly Daniels will continue to follow up with her primary care physician, and she will continue her  medications as directed.  ? ?3. Tobacco use ?We discussed smoking cessation. We will refer Holly Daniels to Cardiology for evaluation. ?- Ambulatory referral to Cardiology ? ?4. OSA (obstructive sleep apnea) ?Holly Daniels was encouraged to use CPAP nightly. We discussed the risks associated with untreated sleep apnea such as hypertension, myocardial infarction, and cerebral vascular accident. ? ?5. Other depression with emotional eating ?Holly Daniels is to take Wellbutrin SR every other day for 1 week and then discontinue. Behavior modification techniques were discussed today to help Holly Daniels deal with her emotional/non-hunger eating behaviors. Orders and follow up as documented in patient record.  ? ?6. Obesity, current BMI 30.7 ?Holly Daniels is currently in the action stage of change. As such, her goal is to continue with weight loss efforts. She has agreed to the Category 3 Plan.  ? ?Behavioral modification strategies: increasing lean protein intake, increasing water intake, no skipping meals, and meal planning and cooking strategies. ? ?Holly Daniels has agreed to follow-up with our clinic in 2 weeks. She was informed of the importance of frequent follow-up visits to maximize her success with intensive lifestyle modifications for her multiple health conditions.  ? ?Objective:  ? ?Blood pressure 129/71, pulse 89, temperature 98.3 ?F (36.8 ?C), height '5\' 4"'$  (1.626 m), weight 178 lb (80.7 kg), SpO2 97 %. ?Body mass index is 30.55 kg/m?. ? ?General: Cooperative, alert, well developed, in no acute distress. ?HEENT: Conjunctivae and lids unremarkable. ?Cardiovascular: Regular rhythm.  ?Lungs: Normal work of breathing. ?Neurologic: No focal deficits.  ? ?Lab Results  ?Component Value Date  ? CREATININE 1.04 (H) 06/22/2021  ? BUN 20  06/22/2021  ? NA 142 06/22/2021  ? K 4.3 06/22/2021  ? CL 102 06/22/2021  ? CO2 21 06/22/2021  ? ?Lab Results  ?Component Value Date  ? ALT 13 06/22/2021  ? AST 15 06/22/2021  ? ALKPHOS 86 06/22/2021  ? BILITOT 0.3 06/22/2021   ? ?Lab Results  ?Component Value Date  ? HGBA1C 7.0 (H) 06/22/2021  ? HGBA1C 9.1 (H) 12/05/2015  ? ?No results found for: INSULIN ?Lab Results  ?Component Value Date  ? TSH 1.070 06/22/2021  ? ?Lab Results  ?Component Value Date  ? CHOL 129 06/22/2021  ? HDL 33 (L) 06/22/2021  ? [place] 73 06/22/2021  ? TRIG 130 06/22/2021  ? CHOLHDL 5.5 12/05/2015  ? ?Lab Results  ?Component Value Date  ? VD25OH 15.4 (L) 06/22/2021  ? ?Lab Results  ?Component Value Date  ? WBC 9.4 06/22/2021  ? HGB 16.2 (H) 06/22/2021  ? HCT 47.9 (H) 06/22/2021  ? MCV 89 06/22/2021  ? PLT 183 06/22/2021  ? ?No results found for: IRON, TIBC, FERRITIN ? ?Obesity Behavioral Intervention:  ? ?Approximately 15 minutes were spent on the discussion below. ? ?ASK: ?We discussed the diagnosis of obesity with Holly Daniels today and Holly Daniels agreed to give [place] permission to discuss obesity behavioral modification therapy today. ? ?ASSESS: ?Holly Daniels has the diagnosis of obesity and her BMI today is 30.7. Holly Daniels is in the action stage of change.  ? ?ADVISE: ?Holly Daniels was educated on the multiple health risks of obesity as well as the benefit of weight loss to improve her health. She was advised of the need for long term treatment and the importance of lifestyle modifications to improve her current health and to decrease her risk of future health problems. ? ?AGREE: ?Multiple dietary modification options and treatment options were discussed and Holly Daniels agreed to follow the recommendations documented in the above note. ? ?ARRANGE: ?Holly Daniels was educated on the importance of frequent visits to treat obesity as outlined per CMS and USPSTF guidelines and agreed to schedule her next follow up appointment today. ? ?Attestation Statements:  ? ?Reviewed by clinician on day of visit: allergies, medications, problem list, medical history, surgical history, family history, social history, and previous encounter notes. ? ? ?Daniels, Trixie Dredge, am acting as transcriptionist for Dennard Nip,  MD. ? ?Daniels have reviewed the above documentation for accuracy and completeness, and Daniels agree with the above. -  Dennard Nip, MD ? ? ?

## 2021-12-25 ENCOUNTER — Encounter (INDEPENDENT_AMBULATORY_CARE_PROVIDER_SITE_OTHER): Payer: Self-pay | Admitting: Family Medicine

## 2021-12-25 ENCOUNTER — Other Ambulatory Visit: Payer: Self-pay

## 2021-12-25 ENCOUNTER — Ambulatory Visit (INDEPENDENT_AMBULATORY_CARE_PROVIDER_SITE_OTHER): Payer: Medicare Other | Admitting: Family Medicine

## 2021-12-25 VITALS — BP 102/68 | HR 79 | Temp 98.2°F | Ht 64.0 in | Wt 180.0 lb

## 2021-12-25 DIAGNOSIS — E1169 Type 2 diabetes mellitus with other specified complication: Secondary | ICD-10-CM

## 2021-12-25 DIAGNOSIS — E669 Obesity, unspecified: Secondary | ICD-10-CM

## 2021-12-25 DIAGNOSIS — F3289 Other specified depressive episodes: Secondary | ICD-10-CM | POA: Diagnosis not present

## 2021-12-25 DIAGNOSIS — Z6831 Body mass index (BMI) 31.0-31.9, adult: Secondary | ICD-10-CM

## 2021-12-25 DIAGNOSIS — Z794 Long term (current) use of insulin: Secondary | ICD-10-CM

## 2021-12-26 NOTE — Progress Notes (Signed)
?Cardiology Office Note:   ? ?Date:  12/27/2021  ? ?ID:  Holly Daniels, DOB 05/05/1956, MRN [id number] ? ?PCP:  Audley Hose, MD ?  ?[facility] Providers ?Cardiologist:  Werner Lean, MD    ? ?Referring MD: Starlyn Skeans, MD  ? ?CC: chest discomfort ?Consulted for the evaluation of CP at the behest of Audley Hose, MD ? ?History of Present Illness:   ? ?Holly Daniels is a 66 y.o. female with a hx of Mild non obstructive CAD, HTN with DM, OSA awaiting CPAP, tobacco abuse (been smoking since she was 36) , HLD, who presents for evaluation 12/26/21. ? ?Patient notes that she is feeling a hard heart beat that go into her neck. She has these also when she is laying down on her left side.  They were much worse on topirmate and they have largely improved.  Drinks rare decaf coffee or tea, ? ?She also feels chest pressure after smoking .  The chest pressure is sternal and does not move. Patient exertion notable for walking up the stairs and feels hard heart beats but no chest pressure.    ? ?Husband has had 3 heart attacks and her symptoms are much different. ? ?No shortness of breath at rest but has DOE when going up the stairs (the most of her physical active).  No PND or orthopnea.  No weight gain, leg swelling , or abdominal swelling.  No syncope or near syncope.  ? ?Patient reports prior cardiac testing including prior stress test and heart catheterization years and years ago. ? ? ?Past Medical History:  ?Diagnosis Date  ? Anxiety   ? Back pain   ? Diabetes mellitus   ? oral meds  ? Fibromyalgia   ? GERD (gastroesophageal reflux disease)   ? Headache(784.0)   ? sinus  ? High cholesterol   ? History of C-section   ? x2  ? Hypertension   ? Joint pain   ? Neuropathy   ? OSA (obstructive sleep apnea)   ? Sciatica   ? Sleep apnea   ? ? ?Past Surgical History:  ?Procedure Laterality Date  ? CESAREAN SECTION    ? x2  ? CHOLECYSTECTOMY    ? CHONDROPLASTY Left 09/29/2015  ? Procedure: CHONDROPLASTY;   Surgeon: Frederik Pear, MD;  Location: [place];  Service: Orthopedics;  Laterality: Left;  ? HYSTEROSCOPY WITH D & C  01/14/2012  ? Procedure: DILATATION AND CURETTAGE /HYSTEROSCOPY;  Surgeon: Delice Lesch, MD;  Location: [facility];  Service: Gynecology;  Laterality: N/A;  ? KNEE ARTHROSCOPY WITH MEDIAL MENISECTOMY Left 09/29/2015  ? Procedure: KNEE ARTHROSCOPY WITH MEDIAL MENISECTOMY AND CHONDROPLASTY;  Surgeon: Frederik Pear, MD;  Location: [place];  Service: Orthopedics;  Laterality: Left;  ? vulvar cyst    ? ? ?Current Medications: ?Current Meds  ?Medication Sig  ? amLODipine (NORVASC) 5 MG tablet Take 5 mg by mouth every morning.  ? aspirin EC 81 MG tablet Take 1 tablet (81 mg total) by mouth daily. Swallow whole.  ? buPROPion (WELLBUTRIN SR) 150 MG 12 hr tablet Take 1 tablet (150 mg total) by mouth daily. Take in the AM.  ? Cholecalciferol (VITAMIN D3) 3000 UNITS TABS Take 1 tablet by mouth every morning.  ? cyclobenzaprine (FEXMID) 7.5 MG tablet Take 7.5 mg by mouth once.  ? Dulaglutide (TRULICITY) 7.16 RC/7.8LF SOPN Inject 0.75 mg into the skin.  ? Empagliflozin-metFORMIN HCl (SYNJARDY) 01-999 MG TABS Take by mouth.  ?  fluticasone (FLONASE) 50 MCG/ACT nasal spray Place into both nostrils daily.  ? hydrochlorothiazide (HYDRODIURIL) 25 MG tablet   ? Insulin Glargine 300 UNIT/ML SOPN Inject 30 Units into the skin.  ? Loratadine 10 MG CAPS Take 10 mg by mouth.  ? metoprolol tartrate (LOPRESSOR) 100 MG tablet Take 2 hours prior to Cardiac CT  ? montelukast (SINGULAIR) 10 MG tablet Take 10 mg by mouth at bedtime.  ? nitroGLYCERIN (NITROSTAT) 0.4 MG SL tablet Place 0.4 mg under the tongue every 5 (five) minutes as needed. For chest pains, never needed but has for emergency purpose  ? oxymetazoline (AFRIN) 0.05 % nasal spray Place 2 sprays into the nose 2 (two) times daily as needed. For congestion  ? pantoprazole (PROTONIX) 40 MG tablet Take 40 mg by mouth daily.  ? potassium  chloride (KLOR-CON) 10 MEQ tablet Take 10 mEq by mouth 2 (two) times daily.  ? rosuvastatin (CRESTOR) 20 MG tablet Take 20 mg by mouth daily.  ? telmisartan (MICARDIS) 20 MG tablet Take 20 mg by mouth daily.  ?  ? ?Allergies:   Methylprednisolone, Lisinopril, Prednisone, Topiramate, Codeine, and Lactose intolerance (gi)  ? ?Social History  ? ?Socioeconomic History  ? Marital status: Married  ?  Spouse name: Not on file  ? Number of children: Not on file  ? Years of education: Not on file  ? Highest education level: Not on file  ?Occupational History  ? Occupation: retired  ?Tobacco Use  ? Smoking status: Some Days  ?  Packs/day: 0.00  ?  Years: 15.00  ?  Pack years: 0.00  ?  Types: Cigarettes  ? Smokeless tobacco: Never  ?Substance and Sexual Activity  ? Alcohol use: No  ? Drug use: No  ? Sexual activity: Not Currently  ?  Birth control/protection: Post-menopausal  ?Other Topics Concern  ? Not on file  ?Social History Narrative  ? Not on file  ? ?Social Determinants of Health  ? ?Financial Resource Strain: Not on file  ?Food Insecurity: Not on file  ?Transportation Needs: Not on file  ?Physical Activity: Not on file  ?Stress: Not on file  ?Social Connections: Not on file  ?  ?Family History: ?The patient's family history includes Alcoholism in her mother; Breast cancer in her sister and sister; Cancer in her father, sister, and sister; Colon cancer in her father. ? ?ROS:   ?Please see the history of present illness.    ? All other systems reviewed and are negative. ? ?EKGs/Labs/Other Studies Reviewed:   ? ?The following studies were reviewed today: ? ?EKG:  EKG is  ordered today.  The ekg ordered today demonstrates  ?SR rate 77 borderline anterior infarction  ? ?Recent Labs: ?06/22/2021: ALT 13; BUN 20; Creatinine, Ser 1.04; Hemoglobin 16.2; Platelets 183; Potassium 4.3; Sodium 142; TSH 1.070  ?Recent Lipid Panel ?   ?Component Value Date/Time  ? CHOL 129 06/22/2021 1020  ? TRIG 130 06/22/2021 1020  ? HDL 33 (L)  06/22/2021 1020  ? CHOLHDL 5.5 12/05/2015 1135  ? VLDL 35 12/05/2015 1135  ? [place] 73 06/22/2021 1020  ? ?    ? ?Physical Exam:   ? ?VS:  BP 133/72   Pulse 77   Ht 5' 3.5" (1.613 m)   Wt 183 lb (83 kg)   SpO2 97%   BMI 31.91 kg/m?    ? ?Wt Readings from Last 3 Encounters:  ?12/27/21 183 lb (83 kg)  ?12/25/21 180 lb (81.6 kg)  ?12/11/21 178 lb (  80.7 kg)  ?  ?Gen: No distress   ?Neck: No JVD  ?Ears:  Pilar Plate Sign ?Cardiac: No Rubs or Gallops, No murmur, RRR +2 radial pulses ?Respiratory: Clear to auscultation bilaterally, normal effort, normal  respiratory rate ?GI: Soft, nontender, non-distended  ?MS: No  edema;  moves all extremities ?Integument: Skin feels warm ?Neuro:  At time of evaluation, alert and oriented to person/place/time/situation  ?Psych: Anxious affect, patient feels OK ? ? ?ASSESSMENT:   ? ?1. Precordial pain   ?2. Palpitations   ?3. Coronary artery disease involving native coronary artery of native heart with unstable angina pectoris ([place])   ? ?PLAN:   ? ?Mild CAD precordial CP  ?HLD ?- LDL goal of < 70 if not 55 ?HTN with DM ?OSA awaiting CPAP ?Anxiety ?Tobacco abuse (been smoking since she was 12) ?Palpitations ?- CCTA ?- start asa 81 mg Po daily ?- amlodipine 5 mg, HCTZ 25,  ?- has needed PRN nitro ?- rosuvastatin 20 mg PO daily, will likely increase to 40 mg  ?- telmisartan 20 mg ?- 14 day non live Ziopatch  ?- lipids at next visit ?- tobacco abuse prevention has discussed at length, we used a three box CBT approach (working on acute stressors that lead to smoking) ? ?Fall follow up with me ?   ? ?Medication Adjustments/Labs and Tests Ordered: ?Current medicines are reviewed at length with the patient today.  Concerns regarding medicines are outlined above.  ?Orders Placed This Encounter  ?Procedures  ? CT CORONARY MORPH W/CTA COR W/SCORE W/CA W/CM &/OR WO/CM  ? Basic metabolic panel  ? LONG TERM MONITOR (3-14 DAYS)  ? EKG 12-Lead  ? ?Meds ordered this encounter  ?Medications  ? metoprolol  tartrate (LOPRESSOR) 100 MG tablet  ?  Sig: Take 2 hours prior to Cardiac CT  ?  Dispense:  1 tablet  ?  Refill:  0  ? aspirin EC 81 MG tablet  ?  Sig: Take 1 tablet (81 mg total) by mouth daily. Swallow whole.  ?  Dispense

## 2021-12-27 ENCOUNTER — Ambulatory Visit (INDEPENDENT_AMBULATORY_CARE_PROVIDER_SITE_OTHER): Payer: Medicare Other

## 2021-12-27 ENCOUNTER — Encounter: Payer: Self-pay | Admitting: Internal Medicine

## 2021-12-27 ENCOUNTER — Ambulatory Visit: Payer: Medicare Other | Admitting: Internal Medicine

## 2021-12-27 VITALS — BP 133/72 | HR 77 | Ht 63.5 in | Wt 183.0 lb

## 2021-12-27 DIAGNOSIS — R072 Precordial pain: Secondary | ICD-10-CM

## 2021-12-27 DIAGNOSIS — R002 Palpitations: Secondary | ICD-10-CM

## 2021-12-27 DIAGNOSIS — I25110 Atherosclerotic heart disease of native coronary artery with unstable angina pectoris: Secondary | ICD-10-CM | POA: Diagnosis not present

## 2021-12-27 MED ORDER — ASPIRIN EC 81 MG PO TBEC: 81.0000 mg | DELAYED_RELEASE_TABLET | Freq: Every day | ORAL | 11 refills | Status: DC

## 2021-12-27 MED ORDER — METOPROLOL TARTRATE 100 MG PO TABS: ORAL_TABLET | ORAL | 0 refills | Status: DC

## 2021-12-27 NOTE — Progress Notes (Signed)
? ? ? ?Chief Complaint:  ? ?OBESITY ?Holly Daniels is here to discuss her progress with her obesity treatment plan along with follow-up of her obesity related diagnoses. Holly Daniels is on the Category 3 Plan and states she is following her eating plan approximately 50% of the time. Holly Daniels states she is doing 0 minutes 0 times per week. ? ?Today's visit was #: 44 ?Starting weight: 203 lbs ?Starting date: 06/22/2021 ?Today's weight: 180 lbs ?Today's date: 12/25/2021 ?Total lbs lost to date: 16 ?Total lbs lost since last in-office visit: 0 ? ?Interim History: Holly Daniels has had extra challenges in the last 2 weeks. She notes her husband has started brining more "junk" food into the house and she has a lot more temptations. ? ?Subjective:  ? ?1. Type 2 diabetes mellitus with other specified complication, with long-term current use of insulin (Holly Daniels) ?Holly Daniels is working on diet, but she has struggled with increased simple carbohydrates recently. She is working on getting back on track.  ? ?2. Other depression with emotional eating ?Holly Daniels is on Wellbutrin, and she notes some nausea and is not taking regularly.  ? ?Assessment/Plan:  ? ?1. Type 2 diabetes mellitus with other specified complication, with long-term current use of insulin ([place]) ?Feliza was educated on simple carbohydrates and fat storage and the effects of hyperglycemia on her health. She is to continue to decrease simple carbohydrates and increase vegetables. ? ?2. Other depression with emotional eating ?Holly Daniels is ok to stop Wellbutrin if she chooses to.  ? ?3. Obesity with current BMI of 31.0 ?Holly Daniels is currently in the action stage of change. As such, her goal is to continue with weight loss efforts. She has agreed to the Category 3 Plan.  ? ?Behavioral modification strategies: increasing lean protein intake and dealing with family or coworker sabotage. ? ?Holly Daniels has agreed to follow-up with our clinic in 2 to 3 weeks. She was informed of the importance of frequent follow-up visits to  maximize her success with intensive lifestyle modifications for her multiple health conditions.  ? ?Objective:  ? ?Blood pressure 102/68, pulse 79, temperature 98.2 ?F (36.8 ?C), height '5\' 4"'$  (1.626 m), weight 180 lb (81.6 kg), SpO2 99 %. ?Body mass index is 30.9 kg/m?. ? ?General: Cooperative, alert, well developed, in no acute distress. ?HEENT: Conjunctivae and lids unremarkable. ?Cardiovascular: Regular rhythm.  ?Lungs: Normal work of breathing. ?Neurologic: No focal deficits.  ? ?Lab Results  ?Component Value Date  ? CREATININE 1.04 (H) 06/22/2021  ? BUN 20 06/22/2021  ? NA 142 06/22/2021  ? K 4.3 06/22/2021  ? CL 102 06/22/2021  ? CO2 21 06/22/2021  ? ?Lab Results  ?Component Value Date  ? ALT 13 06/22/2021  ? AST 15 06/22/2021  ? ALKPHOS 86 06/22/2021  ? BILITOT 0.3 06/22/2021  ? ?Lab Results  ?Component Value Date  ? HGBA1C 7.0 (H) 06/22/2021  ? HGBA1C 9.1 (H) 12/05/2015  ? ?No results found for: INSULIN ?Lab Results  ?Component Value Date  ? TSH 1.070 06/22/2021  ? ?Lab Results  ?Component Value Date  ? CHOL 129 06/22/2021  ? HDL 33 (L) 06/22/2021  ? [place] 73 06/22/2021  ? TRIG 130 06/22/2021  ? CHOLHDL 5.5 12/05/2015  ? ?Lab Results  ?Component Value Date  ? VD25OH 15.4 (L) 06/22/2021  ? ?Lab Results  ?Component Value Date  ? WBC 9.4 06/22/2021  ? HGB 16.2 (H) 06/22/2021  ? HCT 47.9 (H) 06/22/2021  ? MCV 89 06/22/2021  ? PLT 183 06/22/2021  ? ?No  results found for: IRON, TIBC, FERRITIN ? ?Attestation Statements:  ? ?Reviewed by clinician on day of visit: allergies, medications, problem list, medical history, surgical history, family history, social history, and previous encounter notes. ? ?Time spent on visit including pre-visit chart review and post-visit care and charting was 32 minutes.  ? ? ?I, Trixie Dredge, am acting as transcriptionist for Dennard Nip, MD. ? ?I have reviewed the above documentation for accuracy and completeness, and I agree with the above. -  Dennard Nip, MD ? ? ?

## 2021-12-27 NOTE — Patient Instructions (Signed)
Medication Instructions:  ?Your physician has recommended you make the following change in your medication:  ?START: Aspirin 81 mg by mouth once daily ? ?*If you need a refill on your cardiac medications before your next appointment, please call your pharmacy* ? ? ?Lab Work: ?TODAY: BMP ?If you have labs (blood work) drawn today and your tests are completely normal, you will receive your results only by: ?MyChart Message (if you have MyChart) OR ?A paper copy in the mail ?If you have any lab test that is abnormal or we need to change your treatment, we will call you to review the results. ? ? ?Testing/Procedures: ?Your physician has requested that you have cardiac CT. Cardiac computed tomography (CT) is a painless test that uses an x-ray machine to take clear, detailed pictures of your heart. For further information please visit [email or web address]. Please follow instruction sheet as given. ?  ?Your physician has requested that you wear a 14 day heart monitor.   ? ? ?Follow-Up: ?At [facility], you and your health needs are our priority.  As part of our continuing mission to provide you with exceptional heart care, we have created designated Provider Care Teams.  These Care Teams include your primary Cardiologist (physician) and Advanced Practice Providers (APPs -  Physician Assistants and Nurse Practitioners) who all work together to provide you with the care you need, when you need it. ? ? ?Your next appointment:   ?6 month(s) ? ?The format for your next appointment:   ?In Person ? ?Provider:   ?Werner Lean, MD   ? ? ?Other Instructions ? ?Your cardiac CT will be scheduled at the below location:  ? ?[facility] ?[address] ?[place],  [address] ?[phone] ? ?At [facility], please arrive at the [facility] Entrance (Entrance C2) of [facility] 30 minutes prior to test start time. ?You can use the FREE valet parking offered at entrance C  (encouraged to control the heart rate for the test)  ?Proceed to the [facility] Radiology Department (first floor) to check-in and test prep. ? ?All radiology patients and guests should use entrance C2 at [facility], accessed from [facility], even though the hospital's physical address listed is [address]. ? ? ? ? ?Please follow these instructions carefully (unless otherwise directed): ? ? ?On the Night Before the Test: ?Be sure to Drink plenty of water. ?Do not consume any caffeinated/decaffeinated beverages or chocolate 12 hours prior to your test. ?Do not take any antihistamines 12 hours prior to your test. (Flonase, Loratadine/Claritin, Montelukast/ Singular)  ? ? ?On the Day of the Test: ?Drink plenty of water until 1 hour prior to the test. ?Do not eat any food 4 hours prior to the test. ?You may take your regular medications prior to the test.  ?Take metoprolol (Lopressor) 100 mg by mouth two hours prior to test. ?HOLD Hydrochlorothiazide morning of the test. ?FEMALES- please wear underwire-free bra if available, avoid dresses & tight clothing ? ? ?     ?After the Test: ?Drink plenty of water. ?After receiving IV contrast, you may experience a mild flushed feeling. This is normal. ?On occasion, you may experience a mild rash up to 24 hours after the test. This is not dangerous. If this occurs, you can take Benadryl 25 mg and increase your fluid intake. ?If you experience trouble breathing, this can be serious. If it is severe call 911 IMMEDIATELY. If it is mild,  please call our office. ?If you take any of these medications: Metformin (SYNJARDY)  please do not take 48 hours after completing test unless otherwise instructed. ? ?We will call to schedule your test 2-4 weeks out understanding that some insurance companies will need an authorization prior to the service being performed.  ? ?For non-scheduling related questions, please contact the cardiac imaging nurse navigator  should you have any questions/concerns: ?Marchia Bond, Cardiac Imaging Nurse Navigator ?Gordy Clement, Cardiac Imaging Nurse Navigator ?[facility] ?Direct Office Dial: [phone]  ? ?For scheduling needs, including cancellations and rescheduling, please call [place], [phone].   ? ? ? ? ?ZIO XT- Long Term Monitor Instructions ? ?Your physician has requested you wear a ZIO patch monitor for 14 days.  ?This is a single patch monitor. [employer] supplies one patch monitor per enrollment. Additional ?stickers are not available. Please do not apply patch if you will be having a Nuclear Stress Test,  ?Echocardiogram, Cardiac CT, MRI, or Chest Xray during the period you would be wearing the  ?monitor. The patch cannot be worn during these tests. You cannot remove and re-apply the  ?ZIO XT patch monitor.  ?Your ZIO patch monitor will be mailed 3 day USPS to your address on file. It may take 3-5 days  ?to receive your monitor after you have been enrolled.  ?Once you have received your monitor, please review the enclosed instructions. Your monitor  ?has already been registered assigning a specific monitor serial # to you. ? ?Billing and Patient Assistance Program Information ? ?We have supplied [employer] with any of your insurance information on file for billing purposes. ?[employer] offers a sliding scale Patient Assistance Program for patients that do not have  ?insurance, or whose insurance does not completely cover the cost of the ZIO monitor.  ?You must apply for the Patient Assistance Program to qualify for this discounted rate.  ?To apply, please call [employer] at [phone], select option 4, select option 2, ask to apply for  ?Patient Assistance Program. Theodore Demark will ask your household income, and how many people  ?are in your household. They will quote your out-of-pocket cost based on that information.  ?[employer] will also be able to set up a 19-month interest-free payment plan if  needed. ? ?Applying the monitor ?  ?Shave hair from upper left chest.  ?Hold abrader disc by orange tab. Rub abrader in 40 strokes over the upper left chest as  ?indicated in your monitor instructions.  ?Clean area with 4 enclosed alcohol pads. Let dry.  ?Apply patch as indicated in monitor instructions. Patch will be placed under collarbone on left  ?side of chest with arrow pointing upward.  ?Rub patch adhesive wings for 2 minutes. Remove white label marked "1". Remove the white  ?label marked "2". Rub patch adhesive wings for 2 additional minutes.  ?While looking in a mirror, press and release button in center of patch. A small green light will  ?flash 3-4 times. This will be your only indicator that the monitor has been turned on.  ?Do not shower for the first 24 hours. You may shower after the first 24 hours.  ?Press the button if you feel a symptom. You will hear a small click. Record Date, Time and  ?Symptom in the Patient Logbook.  ?When you are ready to remove the patch, follow instructions on the last 2 pages of Patient  ?Logbook. Stick patch monitor onto the last page of Patient Logbook.  ?Place Patient  Logbook in the blue and white box. Use locking tab on box and tape box closed  ?securely. The blue and white box has prepaid postage on it. Please place it in the mailbox as  ?soon as possible. Your physician should have your test results approximately 7 days after the  ?monitor has been mailed back to [facility].  ?Call [facility] at [phone] if you have questions regarding  ?your ZIO XT patch monitor. Call them immediately if you see an orange light blinking on your  ?monitor.  ?If your monitor falls off in less than 4 days, contact our Monitor department at [phone].  ?If your monitor becomes loose or falls off after 4 days call [employer] at [phone] for  ?suggestions on securing your monitor  ?

## 2021-12-27 NOTE — Progress Notes (Unsigned)
Enrolled patient for a 14 Day Zio XT monitor to be mailed to patients home 

## 2021-12-28 LAB — BASIC METABOLIC PANEL
BUN/Creatinine Ratio: 22 (ref 12–28)
BUN: 22 mg/dL (ref 8–27)
CO2: 21 mmol/L (ref 20–29)
Calcium: 10.2 mg/dL (ref 8.7–10.3)
Chloride: 100 mmol/L (ref 96–106)
Creatinine, Ser: 1 mg/dL (ref 0.57–1.00)
Glucose: 97 mg/dL (ref 70–99)
Potassium: 4.1 mmol/L (ref 3.5–5.2)
Sodium: 142 mmol/L (ref 134–144)
eGFR: 62 mL/min/{1.73_m2} (ref >59)

## 2021-12-29 DIAGNOSIS — R072 Precordial pain: Secondary | ICD-10-CM | POA: Diagnosis not present

## 2021-12-29 DIAGNOSIS — R002 Palpitations: Secondary | ICD-10-CM

## 2022-01-01 ENCOUNTER — Ambulatory Visit (INDEPENDENT_AMBULATORY_CARE_PROVIDER_SITE_OTHER): Payer: Medicare Other | Admitting: Family Medicine

## 2022-01-08 ENCOUNTER — Encounter (INDEPENDENT_AMBULATORY_CARE_PROVIDER_SITE_OTHER): Payer: Self-pay | Admitting: Family Medicine

## 2022-01-08 ENCOUNTER — Ambulatory Visit (INDEPENDENT_AMBULATORY_CARE_PROVIDER_SITE_OTHER): Payer: Medicare Other | Admitting: Family Medicine

## 2022-01-08 VITALS — BP 118/69 | HR 78 | Temp 98.4°F | Ht 64.0 in | Wt 178.0 lb

## 2022-01-08 DIAGNOSIS — E669 Obesity, unspecified: Secondary | ICD-10-CM

## 2022-01-08 DIAGNOSIS — Z6830 Body mass index (BMI) 30.0-30.9, adult: Secondary | ICD-10-CM

## 2022-01-08 DIAGNOSIS — E1169 Type 2 diabetes mellitus with other specified complication: Secondary | ICD-10-CM | POA: Diagnosis not present

## 2022-01-08 DIAGNOSIS — Z794 Long term (current) use of insulin: Secondary | ICD-10-CM | POA: Diagnosis not present

## 2022-01-09 NOTE — Progress Notes (Signed)
? ? ? ?Chief Complaint:  ? ?OBESITY ?Holly Daniels is here to discuss her progress with her obesity treatment plan along with follow-up of her obesity related diagnoses. Holly Daniels is on the Category 3 Plan and states she is following her eating plan approximately 65% of the time. Holly Daniels states she is walking for 7-10 minutes 2 times per week. ? ?Today's visit was #: 12 ?Starting weight: 203 lbs ?Starting date: 06/22/2021 ?Today's weight: 178 lbs ?Today's date: 01/08/2022 ?Total lbs lost to date: 25 ?Total lbs lost since last in-office visit: 2 ? ?Interim History: Holly Daniels continues to do well with weight loss on her plan. She is doing well with portion control lately, even when she eats out and she is trying to decrease simple carbohydrates. She has her grandkids staying with her which makes meal planning more difficult.  ? ?Subjective:  ? ?1. Type 2 diabetes mellitus with other specified complication, with long-term current use of insulin ([place]) ?Holly Daniels is doing well with her diet and weight loss. She had a high of 200 after eating a regular popsicle. Her glucose otherwise has been well controlled. She has no signs of hypoglycemia.  ? ?Assessment/Plan:  ? ?1. Type 2 diabetes mellitus with other specified complication, with long-term current use of insulin ([place]) ?Holly Daniels is to get labs done at her primary care physician's office. Good blood sugar control is important to decrease the likelihood of diabetic complications such as nephropathy, neuropathy, limb loss, blindness, coronary artery disease, and death. Intensive lifestyle modification including diet, exercise and weight loss are the first line of treatment for diabetes.  ? ?2. Obesity with current BMI of 30.6 ?Holly Daniels is currently in the action stage of change. As such, her goal is to continue with weight loss efforts. She has agreed to the Category 3 Plan.  ? ?Exercise goals: As is. ? ?Behavioral modification strategies: meal planning and cooking strategies. ? ?Holly Daniels has agreed to  follow-up with our clinic in 2 weeks. She was informed of the importance of frequent follow-up visits to maximize her success with intensive lifestyle modifications for her multiple health conditions.  ? ?Objective:  ? ?Blood pressure 118/69, pulse 78, temperature 98.4 ?F (36.9 ?C), height '5\' 4"'$  (1.626 m), weight 178 lb (80.7 kg), SpO2 99 %. ?Body mass index is 30.55 kg/m?. ? ?General: Cooperative, alert, well developed, in no acute distress. ?HEENT: Conjunctivae and lids unremarkable. ?Cardiovascular: Regular rhythm.  ?Lungs: Normal work of breathing. ?Neurologic: No focal deficits.  ? ?Lab Results  ?Component Value Date  ? CREATININE 1.00 12/27/2021  ? BUN 22 12/27/2021  ? NA 142 12/27/2021  ? K 4.1 12/27/2021  ? CL 100 12/27/2021  ? CO2 21 12/27/2021  ? ?Lab Results  ?Component Value Date  ? ALT 13 06/22/2021  ? AST 15 06/22/2021  ? ALKPHOS 86 06/22/2021  ? BILITOT 0.3 06/22/2021  ? ?Lab Results  ?Component Value Date  ? HGBA1C 7.0 (H) 06/22/2021  ? HGBA1C 9.1 (H) 12/05/2015  ? ?No results found for: INSULIN ?Lab Results  ?Component Value Date  ? TSH 1.070 06/22/2021  ? ?Lab Results  ?Component Value Date  ? CHOL 129 06/22/2021  ? HDL 33 (L) 06/22/2021  ? [place] 73 06/22/2021  ? TRIG 130 06/22/2021  ? CHOLHDL 5.5 12/05/2015  ? ?Lab Results  ?Component Value Date  ? VD25OH 15.4 (L) 06/22/2021  ? ?Lab Results  ?Component Value Date  ? WBC 9.4 06/22/2021  ? HGB 16.2 (H) 06/22/2021  ? HCT 47.9 (H) 06/22/2021  ?  MCV 89 06/22/2021  ? PLT 183 06/22/2021  ? ?No results found for: IRON, TIBC, FERRITIN ? ?Attestation Statements:  ? ?Reviewed by clinician on day of visit: allergies, medications, problem list, medical history, surgical history, family history, social history, and previous encounter notes. ? ?Time spent on visit including pre-visit chart review and post-visit care and charting was 32 minutes.  ? ? ?I, Trixie Dredge, am acting as transcriptionist for Dennard Nip, MD. ? ?I have reviewed the above  documentation for accuracy and completeness, and I agree with the above. -  Dennard Nip, MD ? ? ?

## 2022-01-10 ENCOUNTER — Telehealth (HOSPITAL_COMMUNITY): Payer: Self-pay | Admitting: Emergency Medicine

## 2022-01-10 NOTE — Telephone Encounter (Signed)
Attempted to call patient regarding upcoming cardiac CT appointment. °Left message on voicemail with name and callback number °Amoreena Neubert RN Navigator Cardiac Imaging °[facility] °[phone] Office °[phone] Cell ° °

## 2022-01-10 NOTE — Telephone Encounter (Signed)
Reaching out to patient to offer assistance regarding upcoming cardiac imaging study; pt verbalizes understanding of appt date/time, parking situation and where to check in, pre-test NPO status and medications ordered, and verified current allergies; name and call back number provided for further questions should they arise ?Marchia Bond RN Navigator Cardiac Imaging ?[facility] ?[phone] office ?[phone] cell  ? ?One good vein- will point it out tomorrow ?'100mg'$  metoprolol tartrate  ?Arrival 200 ? ?

## 2022-01-11 ENCOUNTER — Ambulatory Visit (HOSPITAL_COMMUNITY)
Admission: RE | Admit: 2022-01-11 | Discharge: 2022-01-11 | Disposition: A | Discharge status: Home / Self Care | Payer: Medicare Other | Source: Ambulatory Visit | Attending: Internal Medicine | Admitting: Internal Medicine

## 2022-01-11 DIAGNOSIS — R072 Precordial pain: Secondary | ICD-10-CM | POA: Diagnosis not present

## 2022-01-11 MED ORDER — IOHEXOL 350 MG/ML SOLN
100.0000 mL | Freq: Once | INTRAVENOUS | Status: AC | PRN
  Administered 2022-01-11: 100 mL via INTRAVENOUS

## 2022-01-11 MED ORDER — NITROGLYCERIN 0.4 MG SL SUBL
SUBLINGUAL_TABLET | SUBLINGUAL | Status: AC
  Administered 2022-01-11: 0.8 mg via SUBLINGUAL
  Filled 2022-01-11: qty 2

## 2022-01-11 MED ORDER — NITROGLYCERIN 0.4 MG SL SUBL: 0.8000 mg | SUBLINGUAL_TABLET | Freq: Once | SUBLINGUAL | Status: AC

## 2022-01-12 DIAGNOSIS — E782 Mixed hyperlipidemia: Secondary | ICD-10-CM | POA: Diagnosis not present

## 2022-01-12 DIAGNOSIS — E559 Vitamin D deficiency, unspecified: Secondary | ICD-10-CM | POA: Diagnosis not present

## 2022-01-12 DIAGNOSIS — E1121 Type 2 diabetes mellitus with diabetic nephropathy: Secondary | ICD-10-CM | POA: Diagnosis not present

## 2022-01-12 DIAGNOSIS — R002 Palpitations: Secondary | ICD-10-CM | POA: Diagnosis not present

## 2022-01-12 DIAGNOSIS — E1165 Type 2 diabetes mellitus with hyperglycemia: Secondary | ICD-10-CM | POA: Diagnosis not present

## 2022-01-12 DIAGNOSIS — R072 Precordial pain: Secondary | ICD-10-CM | POA: Diagnosis not present

## 2022-01-16 DIAGNOSIS — E1121 Type 2 diabetes mellitus with diabetic nephropathy: Secondary | ICD-10-CM | POA: Diagnosis not present

## 2022-01-16 DIAGNOSIS — E782 Mixed hyperlipidemia: Secondary | ICD-10-CM | POA: Diagnosis not present

## 2022-01-16 DIAGNOSIS — Z1231 Encounter for screening mammogram for malignant neoplasm of breast: Secondary | ICD-10-CM | POA: Diagnosis not present

## 2022-01-16 DIAGNOSIS — I10 Essential (primary) hypertension: Secondary | ICD-10-CM | POA: Diagnosis not present

## 2022-01-16 DIAGNOSIS — E1142 Type 2 diabetes mellitus with diabetic polyneuropathy: Secondary | ICD-10-CM | POA: Diagnosis not present

## 2022-01-16 DIAGNOSIS — E1122 Type 2 diabetes mellitus with diabetic chronic kidney disease: Secondary | ICD-10-CM | POA: Diagnosis not present

## 2022-01-16 DIAGNOSIS — F17290 Nicotine dependence, other tobacco product, uncomplicated: Secondary | ICD-10-CM | POA: Diagnosis not present

## 2022-01-16 DIAGNOSIS — E1165 Type 2 diabetes mellitus with hyperglycemia: Secondary | ICD-10-CM | POA: Diagnosis not present

## 2022-01-16 DIAGNOSIS — I739 Peripheral vascular disease, unspecified: Secondary | ICD-10-CM | POA: Diagnosis not present

## 2022-01-16 DIAGNOSIS — M5459 Other low back pain: Secondary | ICD-10-CM | POA: Diagnosis not present

## 2022-01-16 DIAGNOSIS — Z13820 Encounter for screening for osteoporosis: Secondary | ICD-10-CM | POA: Diagnosis not present

## 2022-01-16 DIAGNOSIS — G4733 Obstructive sleep apnea (adult) (pediatric): Secondary | ICD-10-CM | POA: Diagnosis not present

## 2022-01-22 ENCOUNTER — Encounter (INDEPENDENT_AMBULATORY_CARE_PROVIDER_SITE_OTHER): Payer: Self-pay | Admitting: Family Medicine

## 2022-01-22 ENCOUNTER — Ambulatory Visit (INDEPENDENT_AMBULATORY_CARE_PROVIDER_SITE_OTHER): Payer: Medicare Other | Admitting: Family Medicine

## 2022-01-22 VITALS — BP 123/68 | HR 77 | Temp 98.0°F | Ht 64.0 in | Wt 178.0 lb

## 2022-01-22 DIAGNOSIS — F3289 Other specified depressive episodes: Secondary | ICD-10-CM | POA: Diagnosis not present

## 2022-01-22 DIAGNOSIS — E669 Obesity, unspecified: Secondary | ICD-10-CM

## 2022-01-22 DIAGNOSIS — Z794 Long term (current) use of insulin: Secondary | ICD-10-CM | POA: Diagnosis not present

## 2022-01-22 DIAGNOSIS — E1169 Type 2 diabetes mellitus with other specified complication: Secondary | ICD-10-CM

## 2022-01-22 DIAGNOSIS — Z9189 Other specified personal risk factors, not elsewhere classified: Secondary | ICD-10-CM

## 2022-01-22 DIAGNOSIS — Z6832 Body mass index (BMI) 32.0-32.9, adult: Secondary | ICD-10-CM

## 2022-01-22 DIAGNOSIS — Z6830 Body mass index (BMI) 30.0-30.9, adult: Secondary | ICD-10-CM

## 2022-01-22 MED ORDER — BUPROPION HCL ER (SR) 150 MG PO TB12: 150.0000 mg | ORAL_TABLET | Freq: Every day | ORAL | 0 refills | Status: DC

## 2022-02-01 DIAGNOSIS — E1122 Type 2 diabetes mellitus with diabetic chronic kidney disease: Secondary | ICD-10-CM | POA: Diagnosis not present

## 2022-02-01 NOTE — Progress Notes (Signed)
? ? ? ?Chief Complaint:  ? ?OBESITY ?Holly Daniels is here to discuss her progress with her obesity treatment plan along with follow-up of her obesity related diagnoses. Holly Daniels is on the Category 3 Plan and states she is following her eating plan approximately 65-70% of the time. Holly Daniels states she is doing 0 minutes 0 times per week. ? ?Today's visit was #: 13 ?Starting weight: 203 lbs ?Starting date: 06/22/2021 ?Today's weight: 178 lbs ?Today's date: 01/22/2022 ?Total lbs lost to date: 25 ?Total lbs lost since last in-office visit: 0 ? ?Interim History: Holly Daniels has done well with maintaining her weight. She wants to lose more and she is open to looking at other eating plan options. ? ?Subjective:  ? ?1. Type 2 diabetes mellitus with other specified complication, with long-term current use of insulin ([place]) ?Holly Daniels is stable on her ,medications, and she is doing well with her diet. She denies signs of hypoglycemia.  ? ?2. Other depression with emotional eating ?Holly Daniels's mood is stable on Wellbutrin, and she is doing well with decreasing emotional eating.  ? ?3. At risk for heart disease ?Holly Daniels is at higher than average risk for cardiovascular disease due to obesity. ? ?Assessment/Plan:  ? ?1. Type 2 diabetes mellitus with other specified complication, with long-term current use of insulin ([place]) ?Holly Daniels will change to the low carbohydrates and we will monitor closely for hypoglycemia.  ? ?2. Other depression with emotional eating ?Holly Daniels will continue her Wellbutrin SR, and we will refill for 1 month. ? ?- buPROPion (WELLBUTRIN SR) 150 MG 12 hr tablet; Take 1 tablet (150 mg total) by mouth daily. Take in the AM.  Dispense: 30 tablet; Refill: 0 ? ?3. At risk for heart disease ?Holly Daniels was given approximately 15 minutes of coronary artery disease prevention counseling today. She is 67 y.o. female and has risk factors for heart disease including obesity. We discussed intensive lifestyle modifications today with an emphasis on specific  weight loss instructions and strategies. ? ?Repetitive spaced learning was employed today to elicit superior memory formation and behavioral change.  ? ?4. Obesity with current BMI of 30.6 ?Holly Daniels is currently in the action stage of change. As such, her goal is to continue with weight loss efforts. She has agreed to change to following a lower carbohydrate, vegetable and lean protein rich diet plan.  ? ?Behavioral modification strategies: increasing lean protein intake. ? ?Holly Daniels has agreed to follow-up with our clinic in 3 weeks. She was informed of the importance of frequent follow-up visits to maximize her success with intensive lifestyle modifications for her multiple health conditions.  ? ?Objective:  ? ?Blood pressure 123/68, pulse 77, temperature 98 ?F (36.7 ?C), temperature source Oral, height '5\' 4"'$  (1.626 m), weight 178 lb (80.7 kg), SpO2 100 %. ?Body mass index is 30.55 kg/m?. ? ?General: Cooperative, alert, well developed, in no acute distress. ?HEENT: Conjunctivae and lids unremarkable. ?Cardiovascular: Regular rhythm.  ?Lungs: Normal work of breathing. ?Neurologic: No focal deficits.  ? ?Lab Results  ?Component Value Date  ? CREATININE 1.00 12/27/2021  ? BUN 22 12/27/2021  ? NA 142 12/27/2021  ? K 4.1 12/27/2021  ? CL 100 12/27/2021  ? CO2 21 12/27/2021  ? ?Lab Results  ?Component Value Date  ? ALT 13 06/22/2021  ? AST 15 06/22/2021  ? ALKPHOS 86 06/22/2021  ? BILITOT 0.3 06/22/2021  ? ?Lab Results  ?Component Value Date  ? HGBA1C 7.0 (H) 06/22/2021  ? HGBA1C 9.1 (H) 12/05/2015  ? ?No results found for:  INSULIN ?Lab Results  ?Component Value Date  ? TSH 1.070 06/22/2021  ? ?Lab Results  ?Component Value Date  ? CHOL 129 06/22/2021  ? HDL 33 (L) 06/22/2021  ? [place] 73 06/22/2021  ? TRIG 130 06/22/2021  ? CHOLHDL 5.5 12/05/2015  ? ?Lab Results  ?Component Value Date  ? VD25OH 15.4 (L) 06/22/2021  ? ?Lab Results  ?Component Value Date  ? WBC 9.4 06/22/2021  ? HGB 16.2 (H) 06/22/2021  ? HCT 47.9 (H)  06/22/2021  ? MCV 89 06/22/2021  ? PLT 183 06/22/2021  ? ?No results found for: IRON, TIBC, FERRITIN ? ?Attestation Statements:  ? ?Reviewed by clinician on day of visit: allergies, medications, problem list, medical history, surgical history, family history, social history, and previous encounter notes. ? ? ?I, Trixie Dredge, am acting as transcriptionist for Dennard Nip, MD. ? ?I have reviewed the above documentation for accuracy and completeness, and I agree with the above. -  Dennard Nip, MD ? ? ?

## 2022-02-07 DIAGNOSIS — E1165 Type 2 diabetes mellitus with hyperglycemia: Secondary | ICD-10-CM | POA: Diagnosis not present

## 2022-02-07 DIAGNOSIS — Z794 Long term (current) use of insulin: Secondary | ICD-10-CM | POA: Diagnosis not present

## 2022-02-08 ENCOUNTER — Encounter (INDEPENDENT_AMBULATORY_CARE_PROVIDER_SITE_OTHER): Payer: Self-pay | Admitting: Family Medicine

## 2022-02-08 ENCOUNTER — Ambulatory Visit (INDEPENDENT_AMBULATORY_CARE_PROVIDER_SITE_OTHER): Payer: Medicare Other | Admitting: Family Medicine

## 2022-02-08 VITALS — BP 121/63 | HR 80 | Temp 98.5°F | Ht 64.0 in | Wt 173.0 lb

## 2022-02-08 DIAGNOSIS — I10 Essential (primary) hypertension: Secondary | ICD-10-CM

## 2022-02-08 DIAGNOSIS — E669 Obesity, unspecified: Secondary | ICD-10-CM | POA: Insufficient documentation

## 2022-02-08 DIAGNOSIS — Z6829 Body mass index (BMI) 29.0-29.9, adult: Secondary | ICD-10-CM

## 2022-02-16 DIAGNOSIS — E1165 Type 2 diabetes mellitus with hyperglycemia: Secondary | ICD-10-CM | POA: Diagnosis not present

## 2022-02-22 NOTE — Progress Notes (Signed)
Chief Complaint:   OBESITY Holly Daniels is here to discuss her progress with her obesity treatment plan along with follow-up of her obesity related diagnoses. Holly Daniels is on following a lower carbohydrate, vegetable and lean protein rich diet plan and states she is following her eating plan approximately 50% of the time. Holly Daniels states she is doing 0 minutes 0 times per week.  Today's visit was #: 14 Starting weight: 203 lbs Starting date: 06/22/2021 Today's weight: 173 lbs Today's date: 02/08/2022 Total lbs lost to date: 30 Total lbs lost since last in-office visit: 5  Interim History: Holly Daniels continues to do well with weight loss. Her hunger is controlled and she is doing very well with meal planning prepping.   Subjective:   1. Essential hypertension Holly Daniels's blood pressure is well controlled with weight loss and her medications. No side effects were noted.   Assessment/Plan:   1. Essential hypertension Holly Daniels will continue with her diet, exercise, and weight loss. She will watch for signs of hypotension.   2. Obesity, Current BMI 29.8 Holly Daniels is currently in the action stage of change. As such, her goal is to continue with weight loss efforts. She has agreed to following a lower carbohydrate, vegetable and lean protein rich diet plan.   Behavioral modification strategies: increasing lean protein intake and no skipping meals.  Holly Daniels has agreed to follow-up with our clinic in 3 weeks. She was informed of the importance of frequent follow-up visits to maximize her success with intensive lifestyle modifications for her multiple health conditions.   Objective:   Blood pressure 121/63, pulse 80, temperature 98.5 F (36.9 C), height '5\' 4"'$  (1.626 m), weight 173 lb (78.5 kg), SpO2 98 %. Body mass index is 29.7 kg/m.  General: Cooperative, alert, well developed, in no acute distress. HEENT: Conjunctivae and lids unremarkable. Cardiovascular: Regular rhythm.  Lungs: Normal work of  breathing. Neurologic: No focal deficits.   Lab Results  Component Value Date   CREATININE 1.00 12/27/2021   BUN 22 12/27/2021   NA 142 12/27/2021   K 4.1 12/27/2021   CL 100 12/27/2021   CO2 21 12/27/2021   Lab Results  Component Value Date   ALT 13 06/22/2021   AST 15 06/22/2021   ALKPHOS 86 06/22/2021   BILITOT 0.3 06/22/2021   Lab Results  Component Value Date   HGBA1C 7.0 (H) 06/22/2021   HGBA1C 9.1 (H) 12/05/2015   No results found for: INSULIN Lab Results  Component Value Date   TSH 1.070 06/22/2021   Lab Results  Component Value Date   CHOL 129 06/22/2021   HDL 33 (L) 06/22/2021   LDLCALC 73 06/22/2021   TRIG 130 06/22/2021   CHOLHDL 5.5 12/05/2015   Lab Results  Component Value Date   VD25OH 15.4 (L) 06/22/2021   Lab Results  Component Value Date   WBC 9.4 06/22/2021   HGB 16.2 (H) 06/22/2021   HCT 47.9 (H) 06/22/2021   MCV 89 06/22/2021   PLT 183 06/22/2021   No results found for: IRON, TIBC, FERRITIN  Attestation Statements:   Reviewed by clinician on day of visit: allergies, medications, problem list, medical history, surgical history, family history, social history, and previous encounter notes.  Time spent on visit including pre-visit chart review and post-visit care and charting was 30 minutes.    I, Trixie Dredge, am acting as transcriptionist for Dennard Nip, MD.  I have reviewed the above documentation for accuracy and completeness, and I agree with the above. -  Dennard Nip, MD

## 2022-03-05 ENCOUNTER — Ambulatory Visit (INDEPENDENT_AMBULATORY_CARE_PROVIDER_SITE_OTHER): Payer: Medicare Other | Admitting: Family Medicine

## 2022-03-06 ENCOUNTER — Encounter (INDEPENDENT_AMBULATORY_CARE_PROVIDER_SITE_OTHER): Payer: Self-pay | Admitting: Family Medicine

## 2022-03-06 ENCOUNTER — Ambulatory Visit (INDEPENDENT_AMBULATORY_CARE_PROVIDER_SITE_OTHER): Payer: Medicare Other | Admitting: Family Medicine

## 2022-03-06 VITALS — BP 90/54 | HR 74 | Temp 98.9°F | Ht 64.0 in | Wt 172.0 lb

## 2022-03-06 DIAGNOSIS — E669 Obesity, unspecified: Secondary | ICD-10-CM

## 2022-03-06 DIAGNOSIS — Z6829 Body mass index (BMI) 29.0-29.9, adult: Secondary | ICD-10-CM

## 2022-03-06 DIAGNOSIS — F4321 Adjustment disorder with depressed mood: Secondary | ICD-10-CM | POA: Diagnosis not present

## 2022-03-06 DIAGNOSIS — Z6834 Body mass index (BMI) 34.0-34.9, adult: Secondary | ICD-10-CM

## 2022-03-06 DIAGNOSIS — G4700 Insomnia, unspecified: Secondary | ICD-10-CM

## 2022-03-06 MED ORDER — TRAZODONE HCL 50 MG PO TABS: 50.0000 mg | ORAL_TABLET | Freq: Every day | ORAL | 0 refills | Status: DC

## 2022-03-08 NOTE — Progress Notes (Unsigned)
Chief Complaint:   OBESITY Holly Daniels is here to discuss her progress with her obesity treatment plan along with follow-up of her obesity related diagnoses. Holly Daniels is on following a lower carbohydrate, vegetable and lean protein rich diet plan and states she is following her eating plan approximately 0% of the time. Holly Daniels states she is doing 0 minutes 0 times per week.  Today's visit was #: 15 Starting weight: 203 lbs Starting date: 06/22/2021 Today's weight: 172 lbs Today's date: 03/06/2022 Total lbs lost to date: 31 Total lbs lost since last in-office visit: 1  Interim History: Holly Daniels's sister died approximately 3 weeks ago. Her hunger is controlled but she has not been able to follow her plan, and she is just trying to not forget to eat.   Subjective:   1. Insomnia, unspecified type Holly Daniels has struggled to sleep since her sister passed away. She cant shut her "mind" off.   2. Grieving Holly Daniels is grieving the recent unexpected loss of her sister. She hasn't been eating well, but she is trying to be healthier. She appears to be grieving in a healthy way and she shows no signs of suicidal ideations.   Assessment/Plan:   1. Insomnia, unspecified type Holly Daniels agreed to start trazodone 50 mg 1 tablet PO qhs as needed, with no refills. We will follow up at her next visit.   - traZODone (DESYREL) 50 MG tablet; Take 1 tablet (50 mg total) by mouth at bedtime.  Dispense: 30 tablet; Refill: 0  2. Grieving We will continue to monitor. Holly Daniels was encouraged to work on her sleep and eating, and we will follow up in 3 weeks at her next visit.  3. Obesity, Current BMI 29.5 Holly Daniels is currently in the action stage of change. As such, her goal is to maintain weight for now. She has agreed to practicing portion control and making smarter food choices, such as increasing vegetables and decreasing simple carbohydrates.   Holly Daniels's goal is to maintain her weight while she is adjusting to her new normal.    Behavioral modification strategies: increasing lean protein intake and no skipping meals.  Holly Daniels has agreed to follow-up with our clinic in 3 weeks. She was informed of the importance of frequent follow-up visits to maximize her success with intensive lifestyle modifications for her multiple health conditions.   Objective:   Blood pressure (!) 90/54, pulse 74, temperature 98.9 F (37.2 C), height '5\' 4"'$  (1.626 m), weight 172 lb (78 kg), SpO2 98 %. Body mass index is 29.52 kg/m.  General: Cooperative, alert, well developed, in no acute distress. HEENT: Conjunctivae and lids unremarkable. Cardiovascular: Regular rhythm.  Lungs: Normal work of breathing. Neurologic: No focal deficits.   Lab Results  Component Value Date   CREATININE 1.00 12/27/2021   BUN 22 12/27/2021   NA 142 12/27/2021   K 4.1 12/27/2021   CL 100 12/27/2021   CO2 21 12/27/2021   Lab Results  Component Value Date   ALT 13 06/22/2021   AST 15 06/22/2021   ALKPHOS 86 06/22/2021   BILITOT 0.3 06/22/2021   Lab Results  Component Value Date   HGBA1C 7.0 (H) 06/22/2021   HGBA1C 9.1 (H) 12/05/2015   No results found for: "INSULIN" Lab Results  Component Value Date   TSH 1.070 06/22/2021   Lab Results  Component Value Date   CHOL 129 06/22/2021   HDL 33 (L) 06/22/2021   LDLCALC 73 06/22/2021   TRIG 130 06/22/2021   CHOLHDL 5.5  12/05/2015   Lab Results  Component Value Date   VD25OH 15.4 (L) 06/22/2021   Lab Results  Component Value Date   WBC 9.4 06/22/2021   HGB 16.2 (H) 06/22/2021   HCT 47.9 (H) 06/22/2021   MCV 89 06/22/2021   PLT 183 06/22/2021   No results found for: "IRON", "TIBC", "FERRITIN"  Attestation Statements:   Reviewed by clinician on day of visit: allergies, medications, problem list, medical history, surgical history, family history, social history, and previous encounter notes.  Time spent on visit including pre-visit chart review and post-visit care and charting was 42  minutes.   I, Trixie Dredge, am acting as transcriptionist for Dennard Nip, MD.  I have reviewed the above documentation for accuracy and completeness, and I agree with the above. -  Dennard Nip, MD

## 2022-03-10 DIAGNOSIS — Z794 Long term (current) use of insulin: Secondary | ICD-10-CM | POA: Diagnosis not present

## 2022-03-10 DIAGNOSIS — E1165 Type 2 diabetes mellitus with hyperglycemia: Secondary | ICD-10-CM | POA: Diagnosis not present

## 2022-03-19 DIAGNOSIS — E1165 Type 2 diabetes mellitus with hyperglycemia: Secondary | ICD-10-CM | POA: Diagnosis not present

## 2022-03-26 ENCOUNTER — Ambulatory Visit (INDEPENDENT_AMBULATORY_CARE_PROVIDER_SITE_OTHER): Payer: Medicare Other | Admitting: Family Medicine

## 2022-03-27 ENCOUNTER — Ambulatory Visit (INDEPENDENT_AMBULATORY_CARE_PROVIDER_SITE_OTHER): Payer: Medicare Other | Admitting: Family Medicine

## 2022-03-27 ENCOUNTER — Encounter (INDEPENDENT_AMBULATORY_CARE_PROVIDER_SITE_OTHER): Payer: Self-pay

## 2022-04-10 DIAGNOSIS — E1165 Type 2 diabetes mellitus with hyperglycemia: Secondary | ICD-10-CM | POA: Diagnosis not present

## 2022-04-10 DIAGNOSIS — Z794 Long term (current) use of insulin: Secondary | ICD-10-CM | POA: Diagnosis not present

## 2022-04-16 ENCOUNTER — Ambulatory Visit (INDEPENDENT_AMBULATORY_CARE_PROVIDER_SITE_OTHER): Payer: Medicare Other | Admitting: Family Medicine

## 2022-04-16 DIAGNOSIS — E559 Vitamin D deficiency, unspecified: Secondary | ICD-10-CM | POA: Diagnosis not present

## 2022-04-16 DIAGNOSIS — E1165 Type 2 diabetes mellitus with hyperglycemia: Secondary | ICD-10-CM | POA: Diagnosis not present

## 2022-04-16 DIAGNOSIS — E1122 Type 2 diabetes mellitus with diabetic chronic kidney disease: Secondary | ICD-10-CM | POA: Diagnosis not present

## 2022-04-16 DIAGNOSIS — Z122 Encounter for screening for malignant neoplasm of respiratory organs: Secondary | ICD-10-CM | POA: Diagnosis not present

## 2022-04-16 DIAGNOSIS — M5459 Other low back pain: Secondary | ICD-10-CM | POA: Diagnosis not present

## 2022-04-16 DIAGNOSIS — E782 Mixed hyperlipidemia: Secondary | ICD-10-CM | POA: Diagnosis not present

## 2022-04-16 DIAGNOSIS — E1121 Type 2 diabetes mellitus with diabetic nephropathy: Secondary | ICD-10-CM | POA: Diagnosis not present

## 2022-04-16 DIAGNOSIS — F17290 Nicotine dependence, other tobacco product, uncomplicated: Secondary | ICD-10-CM | POA: Diagnosis not present

## 2022-04-16 DIAGNOSIS — I10 Essential (primary) hypertension: Secondary | ICD-10-CM | POA: Diagnosis not present

## 2022-04-18 ENCOUNTER — Ambulatory Visit (INDEPENDENT_AMBULATORY_CARE_PROVIDER_SITE_OTHER): Payer: Medicare Other | Admitting: Family Medicine

## 2022-04-18 ENCOUNTER — Encounter (INDEPENDENT_AMBULATORY_CARE_PROVIDER_SITE_OTHER): Payer: Self-pay | Admitting: Family Medicine

## 2022-04-18 VITALS — BP 89/47 | HR 85 | Temp 98.1°F | Ht 64.0 in | Wt 170.0 lb

## 2022-04-18 DIAGNOSIS — Z794 Long term (current) use of insulin: Secondary | ICD-10-CM | POA: Diagnosis not present

## 2022-04-18 DIAGNOSIS — E669 Obesity, unspecified: Secondary | ICD-10-CM

## 2022-04-18 DIAGNOSIS — E1169 Type 2 diabetes mellitus with other specified complication: Secondary | ICD-10-CM | POA: Diagnosis not present

## 2022-04-18 DIAGNOSIS — Z6829 Body mass index (BMI) 29.0-29.9, adult: Secondary | ICD-10-CM | POA: Diagnosis not present

## 2022-04-24 NOTE — Progress Notes (Signed)
Chief Complaint:   OBESITY Holly Daniels is here to discuss her progress with her obesity treatment plan along with follow-up of her obesity related diagnoses. Holly Daniels is on practicing portion control and making smarter food choices, such as increasing vegetables and decreasing simple carbohydrates and states she is following her eating plan approximately 50% of the time. Holly Daniels states she is walking and cycling for 10-15 minutes 3 times per week.  Today's visit was #: 3 Starting weight: 203 lbs Starting date: 06/22/2021 Today's weight: 170 lbs Today's date: 04/18/2022 Total lbs lost to date: 33 Total lbs lost since last in-office visit: 2  Interim History: Holly Daniels continues to work on weight loss.  She is socializing with her grieving family more and eating out more.  She is working on portion control and she has increased walking with less back pain.  She notes eating more fruit recently.  Subjective:   1. Type 2 diabetes mellitus with other specified complication, with long-term current use of insulin (HCC) Holly Daniels states her A1c has increased to 7.1. She feels this is due to increased fruit volume and some higher sugar fruits as well.  Assessment/Plan:   1. Type 2 diabetes mellitus with other specified complication, with long-term current use of insulin (HCC) Holly Daniels was given the smart fruit list of lower sugar fruit to help improve her glucose levels.  2. Obesity, Current BMI 29.3 Holly Daniels is currently in the action stage of change. As such, her goal is to continue with weight loss efforts. She has agreed to practicing portion control and making smarter food choices, such as increasing vegetables and decreasing simple carbohydrates.   Exercise goals: As is.   Behavioral modification strategies: decreasing simple carbohydrates and increasing water intake.  Holly Daniels has agreed to follow-up with our clinic in 3 to 4 weeks. She was informed of the importance of frequent follow-up visits to maximize  her success with intensive lifestyle modifications for her multiple health conditions.   Objective:   Blood pressure (!) 89/47, pulse 85, temperature 98.1 F (36.7 C), height '5\' 4"'$  (1.626 m), weight 170 lb (77.1 kg), SpO2 100 %. Body mass index is 29.18 kg/m.  General: Cooperative, alert, well developed, in no acute distress. HEENT: Conjunctivae and lids unremarkable. Cardiovascular: Regular rhythm.  Lungs: Normal work of breathing. Neurologic: No focal deficits.   Lab Results  Component Value Date   CREATININE 1.00 12/27/2021   BUN 22 12/27/2021   NA 142 12/27/2021   K 4.1 12/27/2021   CL 100 12/27/2021   CO2 21 12/27/2021   Lab Results  Component Value Date   ALT 13 06/22/2021   AST 15 06/22/2021   ALKPHOS 86 06/22/2021   BILITOT 0.3 06/22/2021   Lab Results  Component Value Date   HGBA1C 7.0 (H) 06/22/2021   HGBA1C 9.1 (H) 12/05/2015   No results found for: "INSULIN" Lab Results  Component Value Date   TSH 1.070 06/22/2021   Lab Results  Component Value Date   CHOL 129 06/22/2021   HDL 33 (L) 06/22/2021   LDLCALC 73 06/22/2021   TRIG 130 06/22/2021   CHOLHDL 5.5 12/05/2015   Lab Results  Component Value Date   VD25OH 15.4 (L) 06/22/2021   Lab Results  Component Value Date   WBC 9.4 06/22/2021   HGB 16.2 (H) 06/22/2021   HCT 47.9 (H) 06/22/2021   MCV 89 06/22/2021   PLT 183 06/22/2021   No results found for: "IRON", "TIBC", "FERRITIN"  Attestation Statements:  Reviewed by clinician on day of visit: allergies, medications, problem list, medical history, surgical history, family history, social history, and previous encounter notes.   I, Trixie Dredge, am acting as transcriptionist for Dennard Nip, MD.  I have reviewed the above documentation for accuracy and completeness, and I agree with the above. -  Dennard Nip, MD

## 2022-04-26 DIAGNOSIS — E1165 Type 2 diabetes mellitus with hyperglycemia: Secondary | ICD-10-CM | POA: Diagnosis not present

## 2022-04-30 DIAGNOSIS — Z794 Long term (current) use of insulin: Secondary | ICD-10-CM | POA: Diagnosis not present

## 2022-04-30 DIAGNOSIS — E1165 Type 2 diabetes mellitus with hyperglycemia: Secondary | ICD-10-CM | POA: Diagnosis not present

## 2022-05-09 ENCOUNTER — Encounter (INDEPENDENT_AMBULATORY_CARE_PROVIDER_SITE_OTHER): Payer: Self-pay

## 2022-05-09 ENCOUNTER — Ambulatory Visit (INDEPENDENT_AMBULATORY_CARE_PROVIDER_SITE_OTHER): Payer: Medicare Other | Admitting: Family Medicine

## 2022-05-09 ENCOUNTER — Encounter (INDEPENDENT_AMBULATORY_CARE_PROVIDER_SITE_OTHER): Payer: Self-pay | Admitting: Family Medicine

## 2022-05-09 VITALS — BP 115/56 | HR 61 | Temp 98.6°F | Ht 64.0 in | Wt 169.0 lb

## 2022-05-09 DIAGNOSIS — Z794 Long term (current) use of insulin: Secondary | ICD-10-CM

## 2022-05-09 DIAGNOSIS — E1169 Type 2 diabetes mellitus with other specified complication: Secondary | ICD-10-CM

## 2022-05-09 DIAGNOSIS — E669 Obesity, unspecified: Secondary | ICD-10-CM | POA: Diagnosis not present

## 2022-05-09 DIAGNOSIS — Z6829 Body mass index (BMI) 29.0-29.9, adult: Secondary | ICD-10-CM | POA: Diagnosis not present

## 2022-05-16 NOTE — Progress Notes (Signed)
Chief Complaint:   OBESITY Holly Daniels is here to discuss her progress with her obesity treatment plan along with follow-up of her obesity related diagnoses. Holly Daniels is on practicing portion control and making smarter food choices, such as increasing vegetables and decreasing simple carbohydrates and states she is following her eating plan approximately 40% of the time. Holly Daniels states she is doing 0 minutes 0 times per week.  Today's visit was #: 79 Starting weight: 203 lbs Starting date: 06/22/2021 Today's weight: 169 lbs Today's date: 05/09/2022 Total lbs lost to date: 34 Total lbs lost since last in-office visit: 1  Interim History: Holly Daniels continues to do well with her weight loss. She is doing well with meeting her protein goals. She is taking college classes again and this is decreasing her exercise.   Subjective:   1. Type 2 diabetes mellitus with other specified complication, with long-term current use of insulin (Holly Daniels) Holly Daniels notes some increase in hypoglycemia with continued weight loss. She doesn't feel low glucose levels anymore but she has a CGM.   Assessment/Plan:   1. Type 2 diabetes mellitus with other specified complication, with long-term current use of insulin (HCC) Holly Daniels has an upcoming appointment with her Endocrinologist who will likely decrease her medications. We will continue to follow closely.   2. Obesity, Current BMI 29.1 Holly Daniels is currently in the action stage of change. As such, her goal is to continue with weight loss efforts. She has agreed to practicing portion control and making smarter food choices, such as increasing vegetables and decreasing simple carbohydrates.   Holly Daniels was encouraged to do the treadmill or stationary bike studying flash cards.   Exercise goals: All adults should avoid inactivity. Some physical activity is better than none, and adults who participate in any amount of physical activity gain some health benefits.  Behavioral modification  strategies: increasing lean protein intake and emotional eating strategies.  Holly Daniels has agreed to follow-up with our clinic in 3 weeks. She was informed of the importance of frequent follow-up visits to maximize her success with intensive lifestyle modifications for her multiple health conditions.   Objective:   Blood pressure (!) 115/56, pulse 61, temperature 98.6 F (37 C), height '5\' 4"'$  (1.626 m), weight 169 lb (76.7 kg), SpO2 99 %. Body mass index is 29.01 kg/m.  General: Cooperative, alert, well developed, in no acute distress. HEENT: Conjunctivae and lids unremarkable. Cardiovascular: Regular rhythm.  Lungs: Normal work of breathing. Neurologic: No focal deficits.   Lab Results  Component Value Date   CREATININE 1.00 12/27/2021   BUN 22 12/27/2021   NA 142 12/27/2021   K 4.1 12/27/2021   CL 100 12/27/2021   CO2 21 12/27/2021   Lab Results  Component Value Date   ALT 13 06/22/2021   AST 15 06/22/2021   ALKPHOS 86 06/22/2021   BILITOT 0.3 06/22/2021   Lab Results  Component Value Date   HGBA1C 7.0 (H) 06/22/2021   HGBA1C 9.1 (H) 12/05/2015   No results found for: "INSULIN" Lab Results  Component Value Date   TSH 1.070 06/22/2021   Lab Results  Component Value Date   CHOL 129 06/22/2021   HDL 33 (L) 06/22/2021   LDLCALC 73 06/22/2021   TRIG 130 06/22/2021   CHOLHDL 5.5 12/05/2015   Lab Results  Component Value Date   VD25OH 15.4 (L) 06/22/2021   Lab Results  Component Value Date   WBC 9.4 06/22/2021   HGB 16.2 (H) 06/22/2021   HCT 47.9 (  H) 06/22/2021   MCV 89 06/22/2021   PLT 183 06/22/2021   No results found for: "IRON", "TIBC", "FERRITIN"  Attestation Statements:   Reviewed by clinician on day of visit: allergies, medications, problem list, medical history, surgical history, family history, social history, and previous encounter notes.  Time spent on visit including pre-visit chart review and post-visit care and charting was 30 minutes.   I,  Trixie Dredge, am acting as transcriptionist for Dennard Nip, MD.  I have reviewed the above documentation for accuracy and completeness, and I agree with the above. -  Dennard Nip, MD

## 2022-05-28 DIAGNOSIS — E1165 Type 2 diabetes mellitus with hyperglycemia: Secondary | ICD-10-CM | POA: Diagnosis not present

## 2022-05-30 ENCOUNTER — Ambulatory Visit: Payer: Medicare Other | Admitting: Internal Medicine

## 2022-05-31 ENCOUNTER — Ambulatory Visit (INDEPENDENT_AMBULATORY_CARE_PROVIDER_SITE_OTHER): Payer: Medicare Other | Admitting: Family Medicine

## 2022-05-31 ENCOUNTER — Encounter (INDEPENDENT_AMBULATORY_CARE_PROVIDER_SITE_OTHER): Payer: Self-pay | Admitting: Family Medicine

## 2022-05-31 VITALS — BP 103/57 | HR 72 | Temp 98.2°F | Ht 64.0 in | Wt 169.0 lb

## 2022-05-31 DIAGNOSIS — Z794 Long term (current) use of insulin: Secondary | ICD-10-CM | POA: Diagnosis not present

## 2022-05-31 DIAGNOSIS — F3289 Other specified depressive episodes: Secondary | ICD-10-CM

## 2022-05-31 DIAGNOSIS — Z6829 Body mass index (BMI) 29.0-29.9, adult: Secondary | ICD-10-CM

## 2022-05-31 DIAGNOSIS — F32A Depression, unspecified: Secondary | ICD-10-CM | POA: Insufficient documentation

## 2022-05-31 DIAGNOSIS — E1169 Type 2 diabetes mellitus with other specified complication: Secondary | ICD-10-CM

## 2022-05-31 DIAGNOSIS — E669 Obesity, unspecified: Secondary | ICD-10-CM | POA: Diagnosis not present

## 2022-05-31 MED ORDER — BUPROPION HCL ER (SR) 150 MG PO TB12: 150.0000 mg | ORAL_TABLET | Freq: Every day | ORAL | 0 refills | Status: DC

## 2022-06-05 ENCOUNTER — Other Ambulatory Visit: Payer: Self-pay | Admitting: Internal Medicine

## 2022-06-05 DIAGNOSIS — R519 Headache, unspecified: Secondary | ICD-10-CM | POA: Diagnosis not present

## 2022-06-05 DIAGNOSIS — R059 Cough, unspecified: Secondary | ICD-10-CM | POA: Diagnosis not present

## 2022-06-05 DIAGNOSIS — E049 Nontoxic goiter, unspecified: Secondary | ICD-10-CM

## 2022-06-05 DIAGNOSIS — J3089 Other allergic rhinitis: Secondary | ICD-10-CM | POA: Diagnosis not present

## 2022-06-05 DIAGNOSIS — J019 Acute sinusitis, unspecified: Secondary | ICD-10-CM | POA: Diagnosis not present

## 2022-06-06 NOTE — Progress Notes (Signed)
Chief Complaint:   OBESITY Holly Daniels is here to discuss her progress with her obesity treatment plan along with follow-up of her obesity related diagnoses. Holly Daniels is on practicing portion control and making smarter food choices, such as increasing vegetables and decreasing simple carbohydrates and states she is following her eating plan approximately 30% of the time. Holly Daniels states she is doing 0 minutes 0 times per week.  Today's visit was #: 18 Starting weight: 203 lbs Starting date: 06/22/2021 Today's weight: 169 lbs Today's date: 05/31/2022 Total lbs lost to date: 34 Total lbs lost since last in-office visit: 0  Interim History: Holly Daniels has done well with maintaining her weight loss. She is working on getting back to exercise. Her hunger is controlled.   Subjective:   1. Type 2 diabetes mellitus with other specified complication, with long-term current use of insulin (HCC) Holly Daniels states she is doing well on her medications. She is doing well with her diet, but she is struggling with exercise.   2. Other depression with emotional eating Holly Daniels is stable on her medications. She is still smoking, but not more than usual.   Assessment/Plan:   1. Type 2 diabetes mellitus with other specified complication, with long-term current use of insulin (HCC) Holly Daniels will continue with her diet, exercise, and her medications.   2. Other depression with emotional eating Holly Daniels will continue Wellbutrin SR 150 mg q AM, and we will refill for 1 month.  - buPROPion (WELLBUTRIN SR) 150 MG 12 hr tablet; Take 1 tablet (150 mg total) by mouth daily. Take in the AM.  Dispense: 30 tablet; Refill: 0  3. Obesity, Holly Daniels BMI 29.0 Holly Daniels is currently in the action stage of change. As such, her goal is to continue with weight loss efforts. She has agreed to the Category 3 Plan.   Behavioral modification strategies: increasing lean protein intake and decreasing simple carbohydrates.  Holly Daniels has agreed to follow-up  with our clinic in 4 weeks. She was informed of the importance of frequent follow-up visits to maximize her success with intensive lifestyle modifications for her multiple health conditions.   Objective:   Blood pressure (!) 103/57, pulse 72, temperature 98.2 F (36.8 C), height '5\' 4"'$  (1.626 m), weight 169 lb (76.7 kg), SpO2 100 %. Body mass index is 29.01 kg/m.  General: Cooperative, alert, well developed, in no acute distress. HEENT: Conjunctivae and lids unremarkable. Cardiovascular: Regular rhythm.  Lungs: Normal work of breathing. Neurologic: No focal deficits.   Lab Results  Component Value Date   CREATININE 1.00 12/27/2021   BUN 22 12/27/2021   NA 142 12/27/2021   K 4.1 12/27/2021   CL 100 12/27/2021   CO2 21 12/27/2021   Lab Results  Component Value Date   ALT 13 06/22/2021   AST 15 06/22/2021   ALKPHOS 86 06/22/2021   BILITOT 0.3 06/22/2021   Lab Results  Component Value Date   HGBA1C 7.0 (H) 06/22/2021   HGBA1C 9.1 (H) 12/05/2015   No results found for: "INSULIN" Lab Results  Component Value Date   TSH 1.070 06/22/2021   Lab Results  Component Value Date   CHOL 129 06/22/2021   HDL 33 (L) 06/22/2021   LDLCALC 73 06/22/2021   TRIG 130 06/22/2021   CHOLHDL 5.5 12/05/2015   Lab Results  Component Value Date   VD25OH 15.4 (L) 06/22/2021   Lab Results  Component Value Date   WBC 9.4 06/22/2021   HGB 16.2 (H) 06/22/2021   HCT 47.9 (  H) 06/22/2021   MCV 89 06/22/2021   PLT 183 06/22/2021   No results found for: "IRON", "TIBC", "FERRITIN"  Attestation Statements:   Reviewed by clinician on day of visit: allergies, medications, problem list, medical history, surgical history, family history, social history, and previous encounter notes.   I, Trixie Dredge, am acting as transcriptionist for Dennard Nip, MD.  I have reviewed the above documentation for accuracy and completeness, and I agree with the above. -  Dennard Nip, MD

## 2022-06-08 ENCOUNTER — Other Ambulatory Visit: Payer: Medicare Other

## 2022-06-15 ENCOUNTER — Other Ambulatory Visit: Payer: Medicare Other

## 2022-06-21 ENCOUNTER — Encounter (INDEPENDENT_AMBULATORY_CARE_PROVIDER_SITE_OTHER): Payer: Self-pay

## 2022-06-21 ENCOUNTER — Ambulatory Visit (INDEPENDENT_AMBULATORY_CARE_PROVIDER_SITE_OTHER): Payer: Medicare Other | Admitting: Family Medicine

## 2022-07-03 DIAGNOSIS — R638 Other symptoms and signs concerning food and fluid intake: Secondary | ICD-10-CM | POA: Diagnosis not present

## 2022-07-03 DIAGNOSIS — M5431 Sciatica, right side: Secondary | ICD-10-CM | POA: Diagnosis not present

## 2022-07-03 DIAGNOSIS — J209 Acute bronchitis, unspecified: Secondary | ICD-10-CM | POA: Diagnosis not present

## 2022-07-03 DIAGNOSIS — R42 Dizziness and giddiness: Secondary | ICD-10-CM | POA: Diagnosis not present

## 2022-07-03 DIAGNOSIS — N183 Chronic kidney disease, stage 3 unspecified: Secondary | ICD-10-CM | POA: Diagnosis not present

## 2022-07-03 DIAGNOSIS — R350 Frequency of micturition: Secondary | ICD-10-CM | POA: Diagnosis not present

## 2022-07-03 DIAGNOSIS — E1142 Type 2 diabetes mellitus with diabetic polyneuropathy: Secondary | ICD-10-CM | POA: Diagnosis not present

## 2022-07-16 DIAGNOSIS — J208 Acute bronchitis due to other specified organisms: Secondary | ICD-10-CM | POA: Diagnosis not present

## 2022-07-16 DIAGNOSIS — J029 Acute pharyngitis, unspecified: Secondary | ICD-10-CM | POA: Diagnosis not present

## 2022-07-16 DIAGNOSIS — J309 Allergic rhinitis, unspecified: Secondary | ICD-10-CM | POA: Diagnosis not present

## 2022-07-19 ENCOUNTER — Encounter (INDEPENDENT_AMBULATORY_CARE_PROVIDER_SITE_OTHER): Payer: Self-pay | Admitting: Family Medicine

## 2022-07-19 ENCOUNTER — Ambulatory Visit (INDEPENDENT_AMBULATORY_CARE_PROVIDER_SITE_OTHER): Payer: Medicare Other | Admitting: Family Medicine

## 2022-07-19 VITALS — BP 117/73 | HR 82 | Temp 97.9°F | Ht 64.0 in | Wt 164.0 lb

## 2022-07-19 DIAGNOSIS — Z7985 Long-term (current) use of injectable non-insulin antidiabetic drugs: Secondary | ICD-10-CM

## 2022-07-19 DIAGNOSIS — E669 Obesity, unspecified: Secondary | ICD-10-CM

## 2022-07-19 DIAGNOSIS — J40 Bronchitis, not specified as acute or chronic: Secondary | ICD-10-CM | POA: Diagnosis not present

## 2022-07-19 DIAGNOSIS — E1169 Type 2 diabetes mellitus with other specified complication: Secondary | ICD-10-CM

## 2022-07-19 DIAGNOSIS — E569 Vitamin deficiency, unspecified: Secondary | ICD-10-CM | POA: Diagnosis not present

## 2022-07-19 DIAGNOSIS — Z6828 Body mass index (BMI) 28.0-28.9, adult: Secondary | ICD-10-CM

## 2022-07-19 MED ORDER — CHOLECALCIFEROL 1.25 MG (50000 UT) PO TABS: 50000.0000 [IU] | ORAL_TABLET | ORAL | 0 refills | Status: DC

## 2022-07-19 NOTE — Progress Notes (Signed)
Office: [phone]  /  Fax: [phone]   Total lbs lost to date: 39 Total lbs lost since last in-office visit: 5     BP 117/73   Pulse 82   Temp 97.9 F (36.6 C)   Ht '5\' 4"'$  (1.626 m)   Wt 164 lb (74.4 kg)   SpO2 100%   BMI 28.15 kg/m  She was weighed on the bioimpedance scale:  Body mass index is 28.15 kg/m.  General:  Alert, oriented and cooperative. Patient is in no acute distress.  Mental Status: Normal mood and affect. Normal behavior. Normal judgment and thought content.        Patient past medical history includes:   Past Medical History:  Diagnosis Date   Anxiety    Back pain    Diabetes mellitus    oral meds   Fibromyalgia    GERD (gastroesophageal reflux disease)    Headache(784.0)    sinus   High cholesterol    History of C-section    x2   Hypertension    Joint pain    Neuropathy    OSA (obstructive sleep apnea)    Sciatica    Sleep apnea     History of Present Illness The patient presents with a history of obesity, diabetes, and hypertension. They recently experienced a severe case of bronchitis, which led to a 5-pound weight loss. The patient was prescribed Z-Pak, Nordell AD, and Teflon pearls for the bronchitis, but reported that one or more of these medications caused nausea. The patient did not have the flu, as they had received their COVID booster and flu shot.  The patient's bronchitis symptoms have slightly improved over the past month, with no more sore throat or itchy ears. However, they still experience a deep cough, which keeps them up at night and produces a significant amount of mucus. The patient has not been able to take Mucinex due to it causing vomiting. Their appetite has been significantly decreased during this time.  The patient's blood sugars have been running low at times, and they have had to adjust their glucose tablets and consume a Pepsi to raise their blood sugar levels. They have not taken any insulin during this  period, and their last hemoglobin A1c was 7.0, with a goal of 6.5. The patient is scheduled for a physical examination and plans to have their hemoglobin A1c checked again.  The patient has a history of low vitamin D levels and has been prescribed vitamin D tablets, which they have not been taking consistently. They have experienced dark spots on their face, which they believe may be related to their low vitamin D levels. The patient plans to restart their vitamin D supplementation at 50,000 international units every week.  The patient has been following a category 3 eating plan to address their obesity and has lost some weight. They are advised to continue with this plan and gradually incorporate more exercise once their bronchitis symptoms improve. The patient's current BMI is 28, with a fat percentage of 34.8 and a visceral fat rating of 8. They are encouraged to maintain their muscle mass through protein intake and strengthening exercises.  Assessment & Plan Bronchitis: Persistent deep cough and mucus production, improved from initial presentation but still present. -Continue current treatment and monitor symptoms. She will follow up with her PCP tomorrow, SaO2 100% today in office  Type 2 Diabetes Mellitus: Last HbA1c was 7.0, goal is 6.5. -Continue current management and recheck HbA1c at physical tomorrow.  Vitamin D Deficiency: Patient has prescription for Vitamin D but has not been taking it due to nausea. -Advise patient to take Vitamin D with a full meal to minimize nausea. Recheck Vitamin D level at physical tomorrow. RF vit D today  Obesity: Patient lost 5 pounds due to illness, currently on category 3 eating plan. -Continue with current eating plan. Once patient's bronchitis improves, discuss increasing exercise.  Muscle Mass Maintenance: Importance of maintaining muscle mass post-menopause. -Encourage patient to continue protein intake and strengthening exercises, such as walking  stairs. and small weights to start.  Follow-up in 8 weeks.     Dennard Nip, MD

## 2022-07-20 DIAGNOSIS — Z122 Encounter for screening for malignant neoplasm of respiratory organs: Secondary | ICD-10-CM | POA: Diagnosis not present

## 2022-07-20 DIAGNOSIS — E782 Mixed hyperlipidemia: Secondary | ICD-10-CM | POA: Diagnosis not present

## 2022-07-20 DIAGNOSIS — Z0001 Encounter for general adult medical examination with abnormal findings: Secondary | ICD-10-CM | POA: Diagnosis not present

## 2022-07-20 DIAGNOSIS — Z1239 Encounter for other screening for malignant neoplasm of breast: Secondary | ICD-10-CM | POA: Diagnosis not present

## 2022-07-20 DIAGNOSIS — Z23 Encounter for immunization: Secondary | ICD-10-CM | POA: Diagnosis not present

## 2022-07-20 DIAGNOSIS — E1165 Type 2 diabetes mellitus with hyperglycemia: Secondary | ICD-10-CM | POA: Diagnosis not present

## 2022-07-20 DIAGNOSIS — F17290 Nicotine dependence, other tobacco product, uncomplicated: Secondary | ICD-10-CM | POA: Diagnosis not present

## 2022-07-20 DIAGNOSIS — E1121 Type 2 diabetes mellitus with diabetic nephropathy: Secondary | ICD-10-CM | POA: Diagnosis not present

## 2022-07-20 DIAGNOSIS — E559 Vitamin D deficiency, unspecified: Secondary | ICD-10-CM | POA: Diagnosis not present

## 2022-07-20 DIAGNOSIS — E1142 Type 2 diabetes mellitus with diabetic polyneuropathy: Secondary | ICD-10-CM | POA: Diagnosis not present

## 2022-07-20 DIAGNOSIS — M5459 Other low back pain: Secondary | ICD-10-CM | POA: Diagnosis not present

## 2022-07-20 DIAGNOSIS — E1122 Type 2 diabetes mellitus with diabetic chronic kidney disease: Secondary | ICD-10-CM | POA: Diagnosis not present

## 2022-07-21 ENCOUNTER — Other Ambulatory Visit: Payer: Self-pay | Admitting: Family Medicine

## 2022-07-21 DIAGNOSIS — Z1239 Encounter for other screening for malignant neoplasm of breast: Secondary | ICD-10-CM

## 2022-07-24 DIAGNOSIS — E1165 Type 2 diabetes mellitus with hyperglycemia: Secondary | ICD-10-CM | POA: Diagnosis not present

## 2022-07-24 DIAGNOSIS — Z794 Long term (current) use of insulin: Secondary | ICD-10-CM | POA: Diagnosis not present

## 2022-08-26 ENCOUNTER — Other Ambulatory Visit (INDEPENDENT_AMBULATORY_CARE_PROVIDER_SITE_OTHER): Payer: Self-pay | Admitting: Family Medicine

## 2022-08-28 DIAGNOSIS — E782 Mixed hyperlipidemia: Secondary | ICD-10-CM | POA: Diagnosis not present

## 2022-09-13 ENCOUNTER — Encounter (INDEPENDENT_AMBULATORY_CARE_PROVIDER_SITE_OTHER): Payer: Self-pay | Admitting: Family Medicine

## 2022-09-13 ENCOUNTER — Ambulatory Visit (INDEPENDENT_AMBULATORY_CARE_PROVIDER_SITE_OTHER): Payer: Medicare Other | Admitting: Family Medicine

## 2022-09-13 VITALS — BP 107/66 | HR 72 | Temp 98.0°F | Ht 64.0 in | Wt 165.0 lb

## 2022-09-13 DIAGNOSIS — Z794 Long term (current) use of insulin: Secondary | ICD-10-CM

## 2022-09-13 DIAGNOSIS — E669 Obesity, unspecified: Secondary | ICD-10-CM | POA: Diagnosis not present

## 2022-09-13 DIAGNOSIS — E1169 Type 2 diabetes mellitus with other specified complication: Secondary | ICD-10-CM

## 2022-09-13 DIAGNOSIS — Z6828 Body mass index (BMI) 28.0-28.9, adult: Secondary | ICD-10-CM | POA: Diagnosis not present

## 2022-09-13 DIAGNOSIS — Z6834 Body mass index (BMI) 34.0-34.9, adult: Secondary | ICD-10-CM

## 2022-09-27 NOTE — Progress Notes (Signed)
Chief Complaint:   OBESITY Holly Daniels is here to discuss her progress with her obesity treatment plan along with follow-up of her obesity related diagnoses. Holly Daniels is on the Category 3 Plan and states she is following her eating plan approximately 40% of the time. Holly Daniels states she is doing 0 minutes 0 times per week.  Today's visit was #: 20 Starting weight: 203 lbs Starting date: 06/22/2021 Today's weight: 165 lbs Today's date: 09/13/2022 Total lbs lost to date: 38 Total lbs lost since last in-office visit: 0  Interim History: Holly Daniels is doing well with working on portion control over the holidays.  She minimize weight gain over Thanksgiving and her goal is to maintain her weight loss over the holidays and get back on to a structured plan in January.  Subjective:   1. Type 2 diabetes mellitus with other specified complication, with long-term current use of insulin (HCC) Holly Daniels is on Trulicity at 1.5 mg and she notes polyphagia.  She continues to work on her diet to help control her diabetes mellitus.  Assessment/Plan:   1. Type 2 diabetes mellitus with other specified complication, with long-term current use of insulin (HCC) Holly Daniels will continue with her diet, exercise, and weight loss.  She will decrease simple carbohydrates to control her glucose.  2. Obesity, Current BMI 28.4 Holly Daniels is currently in the action stage of change. As such, her goal is to maintain weight for now. She has agreed to the Category 3 Plan.   Behavioral modification strategies: increasing lean protein intake, dealing with family or coworker sabotage, and holiday eating strategies .  Holly Daniels has agreed to follow-up with our clinic in 4 to 6 weeks. She was informed of the importance of frequent follow-up visits to maximize her success with intensive lifestyle modifications for her multiple health conditions.   Objective:   Blood pressure 107/66, pulse 72, temperature 98 F (36.7 C), height '5\' 4"'$  (1.626 m), weight  165 lb (74.8 kg), SpO2 98 %. Body mass index is 28.32 kg/m.  General: Cooperative, alert, well developed, in no acute distress. HEENT: Conjunctivae and lids unremarkable. Cardiovascular: Regular rhythm.  Lungs: Normal work of breathing. Neurologic: No focal deficits.   Lab Results  Component Value Date   CREATININE 1.00 12/27/2021   BUN 22 12/27/2021   NA 142 12/27/2021   K 4.1 12/27/2021   CL 100 12/27/2021   CO2 21 12/27/2021   Lab Results  Component Value Date   ALT 13 06/22/2021   AST 15 06/22/2021   ALKPHOS 86 06/22/2021   BILITOT 0.3 06/22/2021   Lab Results  Component Value Date   HGBA1C 7.0 (H) 06/22/2021   HGBA1C 9.1 (H) 12/05/2015   No results found for: "INSULIN" Lab Results  Component Value Date   TSH 1.070 06/22/2021   Lab Results  Component Value Date   CHOL 129 06/22/2021   HDL 33 (L) 06/22/2021   LDLCALC 73 06/22/2021   TRIG 130 06/22/2021   CHOLHDL 5.5 12/05/2015   Lab Results  Component Value Date   VD25OH 15.4 (L) 06/22/2021   Lab Results  Component Value Date   WBC 9.4 06/22/2021   HGB 16.2 (H) 06/22/2021   HCT 47.9 (H) 06/22/2021   MCV 89 06/22/2021   PLT 183 06/22/2021   No results found for: "IRON", "TIBC", "FERRITIN"  Attestation Statements:   Reviewed by clinician on day of visit: allergies, medications, problem list, medical history, surgical history, family history, social history, and previous encounter notes.  Time spent on visit including pre-visit chart review and post-visit care and charting was 20 minutes.   I, Trixie Dredge, am acting as transcriptionist for Dennard Nip, MD.  I have reviewed the above documentation for accuracy and completeness, and I agree with the above. -  Dennard Nip, MD

## 2022-09-28 DIAGNOSIS — E1165 Type 2 diabetes mellitus with hyperglycemia: Secondary | ICD-10-CM | POA: Diagnosis not present

## 2022-10-09 ENCOUNTER — Ambulatory Visit: Payer: Medicare Other

## 2022-10-11 ENCOUNTER — Ambulatory Visit
Admission: RE | Admit: 2022-10-11 | Discharge: 2022-10-11 | Disposition: A | Discharge status: Home / Self Care | Payer: Medicare Other | Source: Ambulatory Visit | Attending: Family Medicine | Admitting: Family Medicine

## 2022-10-11 DIAGNOSIS — Z1239 Encounter for other screening for malignant neoplasm of breast: Secondary | ICD-10-CM

## 2022-10-11 DIAGNOSIS — Z1231 Encounter for screening mammogram for malignant neoplasm of breast: Secondary | ICD-10-CM | POA: Diagnosis not present

## 2022-10-22 DIAGNOSIS — E1165 Type 2 diabetes mellitus with hyperglycemia: Secondary | ICD-10-CM | POA: Diagnosis not present

## 2022-10-22 DIAGNOSIS — Z794 Long term (current) use of insulin: Secondary | ICD-10-CM | POA: Diagnosis not present

## 2022-10-24 ENCOUNTER — Ambulatory Visit (INDEPENDENT_AMBULATORY_CARE_PROVIDER_SITE_OTHER): Payer: Medicare Other | Admitting: Family Medicine

## 2022-10-24 ENCOUNTER — Encounter (INDEPENDENT_AMBULATORY_CARE_PROVIDER_SITE_OTHER): Payer: Self-pay | Admitting: Family Medicine

## 2022-10-24 VITALS — BP 86/46 | HR 82 | Temp 98.7°F | Ht 64.0 in | Wt 161.0 lb

## 2022-10-24 DIAGNOSIS — E669 Obesity, unspecified: Secondary | ICD-10-CM

## 2022-10-24 DIAGNOSIS — I10 Essential (primary) hypertension: Secondary | ICD-10-CM

## 2022-10-24 DIAGNOSIS — Z7985 Long-term (current) use of injectable non-insulin antidiabetic drugs: Secondary | ICD-10-CM

## 2022-10-24 DIAGNOSIS — Z6825 Body mass index (BMI) 25.0-25.9, adult: Secondary | ICD-10-CM | POA: Insufficient documentation

## 2022-10-24 DIAGNOSIS — Z6827 Body mass index (BMI) 27.0-27.9, adult: Secondary | ICD-10-CM

## 2022-10-24 DIAGNOSIS — E1169 Type 2 diabetes mellitus with other specified complication: Secondary | ICD-10-CM | POA: Diagnosis not present

## 2022-10-30 DIAGNOSIS — E1165 Type 2 diabetes mellitus with hyperglycemia: Secondary | ICD-10-CM | POA: Diagnosis not present

## 2022-11-07 NOTE — Progress Notes (Signed)
Chief Complaint:   OBESITY Holly Daniels is here to discuss her progress with her obesity treatment plan along with follow-up of her obesity related diagnoses. Holly Daniels is on the Category 3 Plan and states she is following her eating plan approximately 60% of the time. Holly Daniels states she is fast pedaling for 10 minutes 2-5 times per week.  Today's visit was #: 21 Starting weight: 203 lbs Starting date: 06/22/2021 Today's weight: 161 lbs Today's date: 10/24/2022 Total lbs lost to date: 42 Total lbs lost since last in-office visit: 4  Interim History: [name] continues to do well with weight loss. Her hunger is mostly controlled and she is doing well with meeting her protein goals. She often ships breakfast.   Subjective:   1. Type 2 diabetes mellitus with other specified complication, without long-term current use of insulin (HCC) Holly Daniels was changed from [place] for [facility] and she is doing well, so far no nausea like she had on Trulicity. She notes decreased polyphagia.   2. Essential hypertension Holly Daniels's blood pressure continue to decrease as she loses weight, and she is at high risk of hypotension.   Assessment/Plan:   1. Type 2 diabetes mellitus with other specified complication, without long-term current use of insulin ([place]) Holly Daniels will continue Mounjaro at current dose and we will follow up in 4 weeks.   2. Essential hypertension Holly Daniels will continue amlodipine, and will discontinue HCTZ and recheck her blood pressure in 4 weeks at her next visit.   3. BMI 27.0-27.9,adult  4. Obesity, Beginning BMI 34.84 Holly Daniels is currently in the action stage of change. As such, her goal is to continue with weight loss efforts. She has agreed to the Category 3 Plan.   Exercise goals: Wobble cushion exercises were demonstrated and she did them in the office to strengthening core muscles.   Behavioral modification strategies: increasing lean protein intake.  Holly Daniels has agreed to follow-up with our  clinic in 4 weeks. She was informed of the importance of frequent follow-up visits to maximize her success with intensive lifestyle modifications for her multiple health conditions.   Objective:   Blood pressure (!) 86/46, pulse 82, temperature 98.7 F (37.1 C), height '5\' 4"'$  (1.626 m), weight 161 lb (73 kg), SpO2 100 %. Body mass index is 27.64 kg/m.  General: Cooperative, alert, well developed, in no acute distress. HEENT: Conjunctivae and lids unremarkable. Cardiovascular: Regular rhythm.  Lungs: Normal work of breathing. Neurologic: No focal deficits.   Lab Results  Component Value Date   CREATININE 1.00 12/27/2021   BUN 22 12/27/2021   NA 142 12/27/2021   K 4.1 12/27/2021   CL 100 12/27/2021   CO2 21 12/27/2021   Lab Results  Component Value Date   ALT 13 06/22/2021   AST 15 06/22/2021   ALKPHOS 86 06/22/2021   BILITOT 0.3 06/22/2021   Lab Results  Component Value Date   HGBA1C 7.0 (H) 06/22/2021   HGBA1C 9.1 (H) 12/05/2015   No results found for: "INSULIN" Lab Results  Component Value Date   TSH 1.070 06/22/2021   Lab Results  Component Value Date   CHOL 129 06/22/2021   HDL 33 (L) 06/22/2021   LDLCALC 73 06/22/2021   TRIG 130 06/22/2021   CHOLHDL 5.5 12/05/2015   Lab Results  Component Value Date   VD25OH 15.4 (L) 06/22/2021   Lab Results  Component Value Date   WBC 9.4 06/22/2021   HGB 16.2 (H) 06/22/2021   HCT 47.9 (H) 06/22/2021  MCV 89 06/22/2021   PLT 183 06/22/2021   No results found for: "IRON", "TIBC", "FERRITIN"  Attestation Statements:   Reviewed by clinician on day of visit: allergies, medications, problem list, medical history, surgical history, family history, social history, and previous encounter notes.  Time spent on visit including pre-visit chart review and post-visit care and charting was 40 minutes.   I, Trixie Dredge, am acting as transcriptionist for Dennard Nip, MD.  I have reviewed the above documentation for  accuracy and completeness, and I agree with the above. -  Dennard Nip, MD

## 2022-11-21 ENCOUNTER — Ambulatory Visit (INDEPENDENT_AMBULATORY_CARE_PROVIDER_SITE_OTHER): Payer: Medicare Other | Admitting: Family Medicine

## 2022-11-21 ENCOUNTER — Encounter (INDEPENDENT_AMBULATORY_CARE_PROVIDER_SITE_OTHER): Payer: Self-pay | Admitting: Family Medicine

## 2022-11-21 VITALS — BP 114/63 | HR 80 | Temp 98.3°F | Ht 64.0 in | Wt 162.0 lb

## 2022-11-21 DIAGNOSIS — E669 Obesity, unspecified: Secondary | ICD-10-CM | POA: Diagnosis not present

## 2022-11-21 DIAGNOSIS — Z7985 Long-term (current) use of injectable non-insulin antidiabetic drugs: Secondary | ICD-10-CM

## 2022-11-21 DIAGNOSIS — E1169 Type 2 diabetes mellitus with other specified complication: Secondary | ICD-10-CM | POA: Diagnosis not present

## 2022-11-21 DIAGNOSIS — Z6827 Body mass index (BMI) 27.0-27.9, adult: Secondary | ICD-10-CM

## 2022-11-21 DIAGNOSIS — Z794 Long term (current) use of insulin: Secondary | ICD-10-CM

## 2022-11-21 DIAGNOSIS — I10 Essential (primary) hypertension: Secondary | ICD-10-CM | POA: Diagnosis not present

## 2022-11-26 DIAGNOSIS — E559 Vitamin D deficiency, unspecified: Secondary | ICD-10-CM | POA: Diagnosis not present

## 2022-11-26 DIAGNOSIS — Z122 Encounter for screening for malignant neoplasm of respiratory organs: Secondary | ICD-10-CM | POA: Diagnosis not present

## 2022-11-26 DIAGNOSIS — K219 Gastro-esophageal reflux disease without esophagitis: Secondary | ICD-10-CM | POA: Diagnosis not present

## 2022-11-26 DIAGNOSIS — E782 Mixed hyperlipidemia: Secondary | ICD-10-CM | POA: Diagnosis not present

## 2022-11-26 DIAGNOSIS — E1165 Type 2 diabetes mellitus with hyperglycemia: Secondary | ICD-10-CM | POA: Diagnosis not present

## 2022-11-26 DIAGNOSIS — E1142 Type 2 diabetes mellitus with diabetic polyneuropathy: Secondary | ICD-10-CM | POA: Diagnosis not present

## 2022-11-26 DIAGNOSIS — E1122 Type 2 diabetes mellitus with diabetic chronic kidney disease: Secondary | ICD-10-CM | POA: Diagnosis not present

## 2022-11-26 DIAGNOSIS — E1121 Type 2 diabetes mellitus with diabetic nephropathy: Secondary | ICD-10-CM | POA: Diagnosis not present

## 2022-11-26 DIAGNOSIS — I10 Essential (primary) hypertension: Secondary | ICD-10-CM | POA: Diagnosis not present

## 2022-11-26 DIAGNOSIS — F17290 Nicotine dependence, other tobacco product, uncomplicated: Secondary | ICD-10-CM | POA: Diagnosis not present

## 2022-11-29 DIAGNOSIS — E1122 Type 2 diabetes mellitus with diabetic chronic kidney disease: Secondary | ICD-10-CM | POA: Diagnosis not present

## 2022-11-29 DIAGNOSIS — E559 Vitamin D deficiency, unspecified: Secondary | ICD-10-CM | POA: Diagnosis not present

## 2022-11-29 DIAGNOSIS — I10 Essential (primary) hypertension: Secondary | ICD-10-CM | POA: Diagnosis not present

## 2022-11-29 DIAGNOSIS — E782 Mixed hyperlipidemia: Secondary | ICD-10-CM | POA: Diagnosis not present

## 2022-11-29 LAB — PROTEIN / CREATININE RATIO, URINE
Albumin, U: 4.7
Creatinine, Urine: 59

## 2022-11-29 LAB — HEMOGLOBIN A1C: Hemoglobin A1C: 7.3

## 2022-11-29 LAB — CBC AND DIFFERENTIAL
HCT: 43 (ref 36–46)
Hemoglobin: 14.1 (ref 12.0–16.0)
Neutrophils Absolute: 5568
Platelets: 186 10*3/uL (ref 150–400)
WBC: 8.2

## 2022-11-29 LAB — HEPATIC FUNCTION PANEL
ALT: 21 U/L (ref 7–35)
AST: 15 (ref 13–35)
Alkaline Phosphatase: 72 (ref 25–125)
Bilirubin, Direct: 0.1
Bilirubin, Total: 0.5

## 2022-11-29 LAB — BASIC METABOLIC PANEL
BUN: 23 — AB (ref 4–21)
CO2: 23 — AB (ref 13–22)
Chloride: 106 (ref 99–108)
Creatinine: 0.8 (ref 0.5–1.1)
Glucose: 128
Potassium: 4.1 mEq/L (ref 3.5–5.1)
Sodium: 138 (ref 137–147)

## 2022-11-29 LAB — VITAMIN D 25 HYDROXY (VIT D DEFICIENCY, FRACTURES): Vit D, 25-Hydroxy: 19

## 2022-11-29 LAB — COMPREHENSIVE METABOLIC PANEL
Albumin: 4.5 (ref 3.5–5.0)
Calcium: 10.1 (ref 8.7–10.7)
Globulin: 2.5
eGFR: 82

## 2022-11-29 LAB — CBC: RBC: 4.7 (ref 3.87–5.11)

## 2022-11-29 LAB — MICROALBUMIN / CREATININE URINE RATIO: Microalb Creat Ratio: 80

## 2022-12-04 NOTE — Progress Notes (Signed)
Chief Complaint:   OBESITY Holly Daniels is here to discuss her progress with her obesity treatment plan along with follow-up of her obesity related diagnoses. Holly Daniels is on the Category 3 Plan and states she is following her eating plan approximately 50% of the time. Holly Daniels states she is bike riding and doing chair exercises for 5-10 minutes 2 times per week.  Today's visit was #: 22 Starting weight: 203 lbs Starting date: 06/22/2021 Today's weight: 162 lbs Today's date: 11/21/2022 Total lbs lost to date: 41 Total lbs lost since last in-office visit: 0  Interim History: Holly Daniels has done well with maintaining her weight.  She is doing well with increasing her water intake.  She notes struggling to meet her protein goals.  Her hunger is well-controlled.  Subjective:   1. Essential hypertension Holly Daniels blood pressure had decreased and we discontinued her hydrochlorothiazide at her last visit.  She is still on amlodipine and telmisartan, and her blood pressure remains well-controlled.  2. Type 2 diabetes mellitus with other specified complication, with long-term current use of insulin (Holly Daniels) Holly Daniels was changed from Holly Daniels to Holly Daniels.  She is not having nausea like she did on Trulicity.  She states polyphagia is controlled and sometimes she struggles to eat all of her food.  Assessment/Plan:   1. Essential hypertension Holly Daniels continues to work on her diet and exercise, and we will continue to monitor for additional decrease in blood pressure with weight loss.  2. Type 2 diabetes mellitus with other specified complication, with long-term current use of insulin (Holly Daniels) Holly Daniels is to follow-up with her PCP.  If she continues to be well-controlled I would advise to not increase her dose as it could cause malnutrition and sarcopenia.  3. BMI 27.0-27.9,adult  4. Obesity, Beginning BMI 34.84 Holly Daniels is currently in the action stage of change. As such, her goal is to continue with weight loss efforts. She  has agreed to the Category 3 Plan.   Holly Daniels is to try some liquid protein choices to help her meet her goals.   Exercise goals: As is.   Behavioral modification strategies: increasing lean protein intake.  Holly Daniels has agreed to follow-up with our clinic in 4 weeks. She was informed of the importance of frequent follow-up visits to maximize her success with intensive lifestyle modifications for her multiple health conditions.   Objective:   Blood pressure 114/63, pulse 80, temperature 98.3 F (36.8 C), height '5\' 4"'$  (1.626 m), weight 162 lb (73.5 kg), SpO2 96 %. Body mass index is 27.81 kg/m.  Lab Results  Component Value Date   CREATININE 1.00 12/27/2021   BUN 22 12/27/2021   NA 142 12/27/2021   K 4.1 12/27/2021   CL 100 12/27/2021   CO2 21 12/27/2021   Lab Results  Component Value Date   ALT 13 06/22/2021   AST 15 06/22/2021   ALKPHOS 86 06/22/2021   BILITOT 0.3 06/22/2021   Lab Results  Component Value Date   HGBA1C 7.0 (H) 06/22/2021   HGBA1C 9.1 (H) 12/05/2015   No results found for: "INSULIN" Lab Results  Component Value Date   TSH 1.070 06/22/2021   Lab Results  Component Value Date   CHOL 129 06/22/2021   HDL 33 (L) 06/22/2021   LDLCALC 73 06/22/2021   TRIG 130 06/22/2021   CHOLHDL 5.5 12/05/2015   Lab Results  Component Value Date   VD25OH 15.4 (L) 06/22/2021   Lab Results  Component Value Date   WBC 9.4 06/22/2021  HGB 16.2 (H) 06/22/2021   HCT 47.9 (H) 06/22/2021   MCV 89 06/22/2021   PLT 183 06/22/2021   No results found for: "IRON", "TIBC", "FERRITIN"  Attestation Statements:   Reviewed by clinician on day of visit: allergies, medications, problem list, medical history, surgical history, family history, social history, and previous encounter notes.  Time spent on visit including pre-visit chart review and post-visit care and charting was 30 minutes.   I, Trixie Dredge, am acting as transcriptionist for Dennard Nip, MD.  I have  reviewed the above documentation for accuracy and completeness, and I agree with the above. -  Dennard Nip, MD

## 2022-12-20 ENCOUNTER — Ambulatory Visit (INDEPENDENT_AMBULATORY_CARE_PROVIDER_SITE_OTHER): Payer: Medicare Other | Admitting: Family Medicine

## 2022-12-20 ENCOUNTER — Encounter (INDEPENDENT_AMBULATORY_CARE_PROVIDER_SITE_OTHER): Payer: Self-pay | Admitting: Family Medicine

## 2022-12-20 VITALS — BP 108/64 | HR 83 | Temp 98.3°F | Ht 64.0 in | Wt 160.0 lb

## 2022-12-20 DIAGNOSIS — E669 Obesity, unspecified: Secondary | ICD-10-CM | POA: Diagnosis not present

## 2022-12-20 DIAGNOSIS — I10 Essential (primary) hypertension: Secondary | ICD-10-CM

## 2022-12-20 DIAGNOSIS — J309 Allergic rhinitis, unspecified: Secondary | ICD-10-CM | POA: Insufficient documentation

## 2022-12-20 DIAGNOSIS — J3089 Other allergic rhinitis: Secondary | ICD-10-CM

## 2022-12-20 DIAGNOSIS — Z6827 Body mass index (BMI) 27.0-27.9, adult: Secondary | ICD-10-CM | POA: Diagnosis not present

## 2022-12-25 NOTE — Progress Notes (Signed)
Chief Complaint:   OBESITY Zyelle is here to discuss her progress with her obesity treatment plan along with follow-up of her obesity related diagnoses. Shahera is on the Category 3 Plan and states she is following her eating plan approximately 70% of the time. Egan states she is bicycling for exercise.    Today's visit was #: 23 Starting weight: 203 lbs Starting date: 06/22/2021 Today's weight: 160 lbs Today's date: 12/20/2022 Total lbs lost to date: 43 Total lbs lost since last in-office visit: 2  Interim History: Shanik continues to work on her diet and exercise.  Her hunger is mostly controlled.  She is working on decreasing snacking at night while she is studying.  Subjective:   1. Seasonal allergic rhinitis due to other allergic trigger Amayah notes increased rhinitis and conjunctivitis from increased pollen.  She is taking allergy medications but she is still struggling.  2. Essential hypertension Marimar's blood pressure is well-controlled with no further episodes of hypotension now that her hydrochlorothiazide has been discontinued.  Assessment/Plan:   1. Seasonal allergic rhinitis due to other allergic trigger Savanaha was encouraged to increase Flonase to 1-2 times per day and change Claritin to Zyrtec's OTC.  She was shown the correct way to take Flonase to get maximum results.  2. Essential hypertension Lilo will continue telmisartan and amlodipine, and will watch for signs of decreased blood pressure with continued weight loss.  3. BMI 27.0-27.9,adult  4. Obesity, Beginning BMI 34.84 Thienan is currently in the action stage of change. As such, her goal is to continue with weight loss efforts. She has agreed to the Category 3 Plan.   Exercise goals: As is.   Behavioral modification strategies: increasing lean protein intake.  Elzina has agreed to follow-up with our clinic in 4 weeks. She was informed of the importance of frequent follow-up visits to maximize her  success with intensive lifestyle modifications for her multiple health conditions.   Objective:   Blood pressure 108/64, pulse 83, temperature 98.3 F (36.8 C), height 5\' 4"  (1.626 m), weight 160 lb (72.6 kg), SpO2 97 %. Body mass index is 27.46 kg/m.  Lab Results  Component Value Date   CREATININE 1.00 12/27/2021   BUN 22 12/27/2021   NA 142 12/27/2021   K 4.1 12/27/2021   CL 100 12/27/2021   CO2 21 12/27/2021   Lab Results  Component Value Date   ALT 13 06/22/2021   AST 15 06/22/2021   ALKPHOS 86 06/22/2021   BILITOT 0.3 06/22/2021   Lab Results  Component Value Date   HGBA1C 7.0 (H) 06/22/2021   HGBA1C 9.1 (H) 12/05/2015   No results found for: "INSULIN" Lab Results  Component Value Date   TSH 1.070 06/22/2021   Lab Results  Component Value Date   CHOL 129 06/22/2021   HDL 33 (L) 06/22/2021   LDLCALC 73 06/22/2021   TRIG 130 06/22/2021   CHOLHDL 5.5 12/05/2015   Lab Results  Component Value Date   VD25OH 15.4 (L) 06/22/2021   Lab Results  Component Value Date   WBC 9.4 06/22/2021   HGB 16.2 (H) 06/22/2021   HCT 47.9 (H) 06/22/2021   MCV 89 06/22/2021   PLT 183 06/22/2021   No results found for: "IRON", "TIBC", "FERRITIN"  Attestation Statements:   Reviewed by clinician on day of visit: allergies, medications, problem list, medical history, surgical history, family history, social history, and previous encounter notes.   Wilhemena Durie, am acting as transcriptionist for  Dennard Nip, MD.  I have reviewed the above documentation for accuracy and completeness, and I agree with the above. -  Dennard Nip, MD

## 2023-01-15 DIAGNOSIS — E1165 Type 2 diabetes mellitus with hyperglycemia: Secondary | ICD-10-CM | POA: Diagnosis not present

## 2023-01-17 ENCOUNTER — Ambulatory Visit (INDEPENDENT_AMBULATORY_CARE_PROVIDER_SITE_OTHER): Payer: Medicare Other | Admitting: Family Medicine

## 2023-01-22 ENCOUNTER — Ambulatory Visit (INDEPENDENT_AMBULATORY_CARE_PROVIDER_SITE_OTHER): Payer: Medicare Other | Admitting: Family Medicine

## 2023-01-22 ENCOUNTER — Encounter (INDEPENDENT_AMBULATORY_CARE_PROVIDER_SITE_OTHER): Payer: Self-pay | Admitting: Family Medicine

## 2023-01-22 VITALS — BP 119/65 | HR 83 | Temp 98.2°F | Ht 64.0 in | Wt 161.0 lb

## 2023-01-22 DIAGNOSIS — E669 Obesity, unspecified: Secondary | ICD-10-CM

## 2023-01-22 DIAGNOSIS — E1169 Type 2 diabetes mellitus with other specified complication: Secondary | ICD-10-CM

## 2023-01-22 DIAGNOSIS — Z7985 Long-term (current) use of injectable non-insulin antidiabetic drugs: Secondary | ICD-10-CM | POA: Diagnosis not present

## 2023-01-22 DIAGNOSIS — F3289 Other specified depressive episodes: Secondary | ICD-10-CM | POA: Diagnosis not present

## 2023-01-22 DIAGNOSIS — Z6827 Body mass index (BMI) 27.0-27.9, adult: Secondary | ICD-10-CM

## 2023-01-22 MED ORDER — TIRZEPATIDE 5 MG/0.5ML ~~LOC~~ SOAJ: 5.0000 mg | SUBCUTANEOUS | 0 refills | Status: DC

## 2023-01-22 MED ORDER — BUPROPION HCL ER (SR) 150 MG PO TB12: 150.0000 mg | ORAL_TABLET | Freq: Every day | ORAL | 0 refills | Status: DC

## 2023-01-23 NOTE — Progress Notes (Signed)
Chief Complaint:   OBESITY Holly Daniels is here to discuss her progress with her obesity treatment plan along with follow-up of her obesity related diagnoses. Holly Daniels is on the Category 3 Plan and states she is following her eating plan approximately 50% of the time. Holly Daniels states she is doing 0 minutes 0 times per week.  Today's visit was #: 24 Starting weight: 203 lbs Starting date: 06/22/2021 Today's weight: 161 lbs Today's date: 01/22/2023 Total lbs lost to date: 42 Total lbs lost since last in-office visit: 0  Interim History: Holly Daniels has done more stress eating with her sister's health scare. She is missing more meals which often increases snacking. She is trying to avoid additional weight gain.   Subjective:   1. Type 2 diabetes mellitus with other specified complication, without long-term current use of insulin Holly Daniels is on Holly Daniels at 2.5 mg, with no nausea but no change in polyphagia.   2. Other depression with emotional eating Holly Daniels sometimes forgets to take Wellbutrin. She notes some increase in comfort eating lately.   Assessment/Plan:   1. Type 2 diabetes mellitus with other specified complication, without long-term current use of insulin Holly Daniels Daniels to increase Holly Daniels to 5 mg once weekly, and we will refill for 1 month.   - tirzepatide Holly Daniels) 5 MG/0.5ML Pen; Inject 5 mg into the skin once a week.  Dispense: 6 mL; Refill: 0  2. Other depression with emotional eating We will refill Wellbutrin SR for 1 month. Holly Daniels will work to remember to take regularly.   - buPROPion (WELLBUTRIN SR) 150 MG 12 hr tablet; Take 1 tablet (150 mg total) by mouth daily. Take in the AM.  Dispense: 30 tablet; Refill: 0  3. BMI 27.0-27.9,adult  4. Obesity, Beginning BMI 34.84 Holly Daniels is currently in the action stage of change. As such, her goal is to continue with weight loss efforts. She has Daniels to the Category 3 Plan.   Behavioral modification strategies: increasing lean protein  intake, no skipping meals, and emotional eating strategies.  Holly Daniels has Daniels to follow-up with our clinic in 4 weeks. She was informed of the importance of frequent follow-up visits to maximize her success with intensive lifestyle modifications for her multiple health conditions.   Objective:   Blood pressure 119/65, pulse 83, temperature 98.2 F (36.8 C), height  (1.626 m), weight 161 lb (73 kg), SpO2 96 %. Body mass index is 27.64 kg/m.  Lab Results  Component Value Date   CREATININE 1.00 12/27/2021   BUN 22 12/27/2021   NA 142 12/27/2021   K 4.1 12/27/2021   CL 100 12/27/2021   CO2 21 12/27/2021   Lab Results  Component Value Date   ALT 13 06/22/2021   AST 15 06/22/2021   ALKPHOS 86 06/22/2021   BILITOT 0.3 06/22/2021   Lab Results  Component Value Date   HGBA1C 7.0 (H) 06/22/2021   HGBA1C 9.1 (H) 12/05/2015   No results found for: "INSULIN" Lab Results  Component Value Date   TSH 1.070 06/22/2021   Lab Results  Component Value Date   CHOL 129 06/22/2021   HDL 33 (L) 06/22/2021   LDLCALC 73 06/22/2021   TRIG 130 06/22/2021   CHOLHDL 5.5 12/05/2015   Lab Results  Component Value Date   VD25OH 15.4 (L) 06/22/2021   Lab Results  Component Value Date   WBC 9.4 06/22/2021   HGB 16.2 (H) 06/22/2021   HCT 47.9 (H) 06/22/2021   MCV 89 06/22/2021  PLT 183 06/22/2021   No results found for: "IRON", "TIBC", "FERRITIN"  Attestation Statements:   Reviewed by clinician on day of visit: allergies, medications, problem list, medical history, surgical history, family history, social history, and previous encounter notes.   I, Burt Knack, am acting as transcriptionist for Holly Quince, MD.  I have reviewed the above documentation for accuracy and completeness, and I agree with the above. -  Holly Quince, MD

## 2023-02-01 ENCOUNTER — Other Ambulatory Visit: Payer: Self-pay | Admitting: Internal Medicine

## 2023-02-21 ENCOUNTER — Ambulatory Visit (INDEPENDENT_AMBULATORY_CARE_PROVIDER_SITE_OTHER): Payer: Medicare Other | Admitting: Family Medicine

## 2023-02-21 ENCOUNTER — Encounter (INDEPENDENT_AMBULATORY_CARE_PROVIDER_SITE_OTHER): Payer: Self-pay

## 2023-03-05 ENCOUNTER — Encounter (INDEPENDENT_AMBULATORY_CARE_PROVIDER_SITE_OTHER): Payer: Self-pay | Admitting: Family Medicine

## 2023-03-05 ENCOUNTER — Ambulatory Visit (INDEPENDENT_AMBULATORY_CARE_PROVIDER_SITE_OTHER): Payer: Medicare Other | Admitting: Family Medicine

## 2023-03-05 VITALS — BP 130/76 | HR 81 | Temp 98.3°F | Ht 64.0 in | Wt 160.0 lb

## 2023-03-05 DIAGNOSIS — Z7985 Long-term (current) use of injectable non-insulin antidiabetic drugs: Secondary | ICD-10-CM

## 2023-03-05 DIAGNOSIS — E1169 Type 2 diabetes mellitus with other specified complication: Secondary | ICD-10-CM

## 2023-03-05 DIAGNOSIS — E669 Obesity, unspecified: Secondary | ICD-10-CM | POA: Diagnosis not present

## 2023-03-05 DIAGNOSIS — Z6827 Body mass index (BMI) 27.0-27.9, adult: Secondary | ICD-10-CM | POA: Diagnosis not present

## 2023-03-05 DIAGNOSIS — F3289 Other specified depressive episodes: Secondary | ICD-10-CM | POA: Diagnosis not present

## 2023-03-05 NOTE — Progress Notes (Unsigned)
.smr  Office: [phone]  /  Fax: [phone]  WEIGHT SUMMARY AND BIOMETRICS  Anthropometric Measurements Height: 5\' 4"  (1.626 m) Weight: 160 lb (72.6 kg) BMI (Calculated): 27.45 Weight at Last Visit: 161lb Weight Lost Since Last Visit: 1lb Weight Gained Since Last Visit: 0lb   Body Composition  Body Fat %: 35.3 % Fat Mass (lbs): 56.8 lbs Muscle Mass (lbs): 98.8 lbs Total Body Water (lbs): 70.6 lbs Visceral Fat Rating : 9   Other Clinical Data Fasting: no Labs: no Today's Visit #: 25  iscussed the use of AI scribe software for clinical note transcription with the patient, who gave verbal consent to proceed.  Chief Complaint: OBESITY  Holly Daniels is here to discuss her progress with her obesity treatment plan. She is on the the Category 3 Plan and states she is following her eating plan approximately 60 % of the time.   History of Present Illness   The patient, who is on a weight loss regimen, reports a significant improvement in hunger control since the dosage of their Mounjaro was increased. They have lost weight since the last visit. They have been experiencing episodes of low blood glucose, with the lowest recorded at 62. These episodes typically occur in the early morning hours, around 3-4 AM, and are managed by consuming glucose tablets. The patient denies any associated nausea with their medication, Mounjaro.  The patient's sleep quality has improved, and they are getting approximately 8-9 hours of sleep per night. They report no challenges with their current eating plan, and have been focusing on increasing protein intake while decreasing simple carbohydrates. They have moved away from consuming large amounts of grapes and are not experiencing excessive hunger.  The patient recently experienced the loss of a friend, which has been emotionally challenging. They have been adhering to their medication regimen and have no current refills pending.         PHYSICAL  EXAM:  Blood pressure 130/76, pulse 81, temperature 98.3 F (36.8 C), height 5\' 4"  (1.626 m), weight 160 lb (72.6 kg), SpO2 100 %. Body mass index is 27.46 kg/m.  DIAGNOSTIC DATA REVIEWED:  BMET    Component Value Date/Time   NA 142 12/27/2021 1538   K 4.1 12/27/2021 1538   CL 100 12/27/2021 1538   CO2 21 12/27/2021 1538   GLUCOSE 97 12/27/2021 1538   GLUCOSE 244 (H) 12/05/2015 1135   BUN 22 12/27/2021 1538   CREATININE 1.00 12/27/2021 1538   CALCIUM 10.2 12/27/2021 1538   GFRNONAA >60 12/05/2015 1135   GFRAA >60 12/05/2015 1135   Lab Results  Component Value Date   HGBA1C 7.0 (H) 06/22/2021   HGBA1C 9.1 (H) 12/05/2015   No results found for: "INSULIN" Lab Results  Component Value Date   TSH 1.070 06/22/2021   CBC    Component Value Date/Time   WBC 9.4 06/22/2021 1020   WBC 10.1 01/04/2012 1355   RBC 5.41 (H) 06/22/2021 1020   RBC 5.04 01/04/2012 1355   HGB 16.2 (H) 06/22/2021 1020   HCT 47.9 (H) 06/22/2021 1020   PLT 183 06/22/2021 1020   MCV 89 06/22/2021 1020   MCH 29.9 06/22/2021 1020   MCH 30.2 01/04/2012 1355   MCHC 33.8 06/22/2021 1020   MCHC 34.5 01/04/2012 1355   RDW 14.8 06/22/2021 1020   Iron Studies No results found for: "IRON", "TIBC", "FERRITIN", "IRONPCTSAT" Lipid Panel     Component Value Date/Time   CHOL 129 06/22/2021 1020   TRIG 130 06/22/2021 1020  HDL 33 (L) 06/22/2021 1020   CHOLHDL 5.5 12/05/2015 1135   VLDL 35 12/05/2015 1135   LDLCALC 73 06/22/2021 1020   Hepatic Function Panel     Component Value Date/Time   PROT 7.0 06/22/2021 1020   ALBUMIN 4.6 06/22/2021 1020   AST 15 06/22/2021 1020   ALT 13 06/22/2021 1020   ALKPHOS 86 06/22/2021 1020   BILITOT 0.3 06/22/2021 1020   BILIDIR <0.1 (L) 12/05/2015 1135   IBILI NOT CALCULATED 12/05/2015 1135      Component Value Date/Time   TSH 1.070 06/22/2021 1020   Nutritional Lab Results  Component Value Date   VD25OH 15.4 (L) 06/22/2021     Assessment and Plan     Diabetes Mellitus: Improved hunger control since increasing Mounjaro dose. Noted episodes of early morning hypoglycemia with lowest recorded glucose of 62. No symptoms of nausea reported with Mounjaro. -Continue Mounjaro as prescribed. -Implement a bedtime snack of approximately 100 calories, preferably protein-based, to prevent early morning hypoglycemia. -Track dietary intake twice weekly to ensure adequate caloric and protein intake (1300-1500 calories and 90-100 grams of protein daily). -Use My Fitness Pal app to assist with tracking dietary intake. -Obtain recent lab results from primary care provider.  Weight Management: Lost 1 pound since last visit. Reports adherence to category three eating plan and decreased cravings for simple carbohydrates. -Continue with category three eating plan. or journaling to make sure she is getting enough correct nutrients. -Avoid excessive use of protein supplements as they may slow weight loss.  Sleep: Reports improved sleep quality and duration (8-9 hours nightly). -Continue current sleep habits as good sleep improves weight loss efforts.  Follow-up in 4 weeks. If any issues arise before then, patient to contact the office.       She was informed of the importance of frequent follow up visits to maximize her success with intensive lifestyle modifications for her multiple health conditions. No follow-ups on file.   Quillian Quince, MD

## 2023-04-02 ENCOUNTER — Other Ambulatory Visit (INDEPENDENT_AMBULATORY_CARE_PROVIDER_SITE_OTHER): Payer: Self-pay | Admitting: Family Medicine

## 2023-04-02 ENCOUNTER — Ambulatory Visit (INDEPENDENT_AMBULATORY_CARE_PROVIDER_SITE_OTHER): Payer: Medicare Other | Admitting: Family Medicine

## 2023-04-02 ENCOUNTER — Encounter (INDEPENDENT_AMBULATORY_CARE_PROVIDER_SITE_OTHER): Payer: Self-pay | Admitting: Family Medicine

## 2023-04-02 VITALS — BP 125/83 | HR 78 | Temp 98.6°F | Ht 64.0 in | Wt 161.0 lb

## 2023-04-02 DIAGNOSIS — Z6827 Body mass index (BMI) 27.0-27.9, adult: Secondary | ICD-10-CM

## 2023-04-02 DIAGNOSIS — Z7985 Long-term (current) use of injectable non-insulin antidiabetic drugs: Secondary | ICD-10-CM | POA: Diagnosis not present

## 2023-04-02 DIAGNOSIS — R0982 Postnasal drip: Secondary | ICD-10-CM | POA: Diagnosis not present

## 2023-04-02 DIAGNOSIS — E1169 Type 2 diabetes mellitus with other specified complication: Secondary | ICD-10-CM

## 2023-04-02 DIAGNOSIS — E669 Obesity, unspecified: Secondary | ICD-10-CM | POA: Diagnosis not present

## 2023-04-02 DIAGNOSIS — E559 Vitamin D deficiency, unspecified: Secondary | ICD-10-CM

## 2023-04-02 MED ORDER — LORATADINE 10 MG PO CAPS
10.0000 mg | ORAL_CAPSULE | Freq: Every day | ORAL | 0 refills | Status: AC
Start: 2023-04-02 — End: ?

## 2023-04-02 MED ORDER — CHOLECALCIFEROL 1.25 MG (50000 UT) PO TABS: 50000.0000 [IU] | ORAL_TABLET | ORAL | 0 refills | Status: DC

## 2023-04-02 MED ORDER — MONTELUKAST SODIUM 10 MG PO TABS
10.0000 mg | ORAL_TABLET | Freq: Every day | ORAL | 0 refills | Status: AC
Start: 2023-04-02 — End: ?

## 2023-04-02 MED ORDER — TIRZEPATIDE 5 MG/0.5ML ~~LOC~~ SOAJ: 5.0000 mg | SUBCUTANEOUS | 0 refills | Status: DC

## 2023-04-02 NOTE — Progress Notes (Unsigned)
Chief Complaint:   OBESITY Holly Daniels is here to discuss her progress with her obesity treatment plan along with follow-up of her obesity related diagnoses. Holly Daniels is on the Category 3 Plan and states she is following her eating plan approximately 50% of the time. Holly Daniels states she is doing 0 minutes 0 times per week.  Today's visit was #: 26 Starting weight: 203 lbs Starting date: 06/22/2021 Today's weight: 161 lbs Today's date: 04/02/2023 Total lbs lost to date: 42 Total lbs lost since last in-office visit: 0  Interim History: Patient has had more celebrations and increased eating out this last month.  She has done well with minimizing her weight gain and she has gotten back on track with her category 3 plan.  Subjective:   1. Vitamin D deficiency Patient is on vitamin D.  She denies nausea, vomiting, or muscle weakness.  2. Type 2 diabetes mellitus with other specified complication, without long-term current use of insulin (HCC) Patient missed a dose of Mounjaro due to pharmacy shortages.  She denies nausea, vomiting, and notes decreased polyphagia.  3. Post-nasal drip Patient is out of her medications and cannot get into her PCP for 2 weeks.  Assessment/Plan:   1. Vitamin D deficiency We will refill prescription vitamin D once weekly for 1 month.  - Cholecalciferol 1.25 MG (50000 UT) TABS; Take 50,000 Units by mouth once a week.  Dispense: 4 tablet; Refill: 0  2. Type 2 diabetes mellitus with other specified complication, without long-term current use of insulin (HCC) Patient will continue Mounjaro 5 mg once weekly, we will refill for 90 days.  - tirzepatide [facility]) 5 MG/0.5ML Pen; Inject 5 mg into the skin once a week.  Dispense: 6 mL; Refill: 0  3. Post-nasal drip Patient will continue her medications, and we will refill Singulair and loratidine for 1 month.  Patient will follow-up with her PCP.  - Loratadine 10 MG CAPS; Take 1 capsule (10 mg total) by mouth daily at 6  (six) AM.  Dispense: 30 capsule; Refill: 0 - montelukast (SINGULAIR) 10 MG tablet; Take 1 tablet (10 mg total) by mouth at bedtime.  Dispense: 30 tablet; Refill: 0  4. BMI 27.0-27.9,adult  5. Obesity, Beginning BMI 34.84 Holly Daniels is currently in the action stage of change. As such, her goal is to continue with weight loss efforts. She has agreed to the Category 3 Plan.   Behavioral modification strategies: increasing lean protein intake.  Holly Daniels has agreed to follow-up with our clinic in 4 weeks. She was informed of the importance of frequent follow-up visits to maximize her success with intensive lifestyle modifications for her multiple health conditions.   Objective:   Blood pressure 125/83, pulse 78, temperature 98.6 F (37 C), height 5\' 4"  (1.626 m), weight 161 lb (73 kg), SpO2 98 %. Body mass index is 27.64 kg/m.  Lab Results  Component Value Date   CREATININE 1.00 12/27/2021   BUN 22 12/27/2021   NA 142 12/27/2021   K 4.1 12/27/2021   CL 100 12/27/2021   CO2 21 12/27/2021   Lab Results  Component Value Date   ALT 13 06/22/2021   AST 15 06/22/2021   ALKPHOS 86 06/22/2021   BILITOT 0.3 06/22/2021   Lab Results  Component Value Date   HGBA1C 7.0 (H) 06/22/2021   HGBA1C 9.1 (H) 12/05/2015   No results found for: "INSULIN" Lab Results  Component Value Date   TSH 1.070 06/22/2021   Lab Results  Component Value  Date   CHOL 129 06/22/2021   HDL 33 (L) 06/22/2021   LDLCALC 73 06/22/2021   TRIG 130 06/22/2021   CHOLHDL 5.5 12/05/2015   Lab Results  Component Value Date   VD25OH 15.4 (L) 06/22/2021   Lab Results  Component Value Date   WBC 9.4 06/22/2021   HGB 16.2 (H) 06/22/2021   HCT 47.9 (H) 06/22/2021   MCV 89 06/22/2021   PLT 183 06/22/2021   No results found for: "IRON", "TIBC", "FERRITIN"  Attestation Statements:   Reviewed by clinician on day of visit: allergies, medications, problem list, medical history, surgical history, family history, social  history, and previous encounter notes.   I, Burt Knack, am acting as transcriptionist for Quillian Quince, MD.  I have reviewed the above documentation for accuracy and completeness, and I agree with the above. -  Quillian Quince, MD

## 2023-04-09 DIAGNOSIS — Z20822 Contact with and (suspected) exposure to covid-19: Secondary | ICD-10-CM | POA: Diagnosis not present

## 2023-04-09 DIAGNOSIS — E1165 Type 2 diabetes mellitus with hyperglycemia: Secondary | ICD-10-CM | POA: Diagnosis not present

## 2023-04-10 DIAGNOSIS — E782 Mixed hyperlipidemia: Secondary | ICD-10-CM | POA: Diagnosis not present

## 2023-04-10 DIAGNOSIS — E1121 Type 2 diabetes mellitus with diabetic nephropathy: Secondary | ICD-10-CM | POA: Diagnosis not present

## 2023-04-10 DIAGNOSIS — F17290 Nicotine dependence, other tobacco product, uncomplicated: Secondary | ICD-10-CM | POA: Diagnosis not present

## 2023-04-10 DIAGNOSIS — K219 Gastro-esophageal reflux disease without esophagitis: Secondary | ICD-10-CM | POA: Diagnosis not present

## 2023-04-10 DIAGNOSIS — E1122 Type 2 diabetes mellitus with diabetic chronic kidney disease: Secondary | ICD-10-CM | POA: Diagnosis not present

## 2023-04-10 DIAGNOSIS — E559 Vitamin D deficiency, unspecified: Secondary | ICD-10-CM | POA: Diagnosis not present

## 2023-04-10 DIAGNOSIS — Z122 Encounter for screening for malignant neoplasm of respiratory organs: Secondary | ICD-10-CM | POA: Diagnosis not present

## 2023-04-10 DIAGNOSIS — E1165 Type 2 diabetes mellitus with hyperglycemia: Secondary | ICD-10-CM | POA: Diagnosis not present

## 2023-04-10 DIAGNOSIS — E1142 Type 2 diabetes mellitus with diabetic polyneuropathy: Secondary | ICD-10-CM | POA: Diagnosis not present

## 2023-04-10 DIAGNOSIS — J3089 Other allergic rhinitis: Secondary | ICD-10-CM | POA: Diagnosis not present

## 2023-04-10 DIAGNOSIS — I10 Essential (primary) hypertension: Secondary | ICD-10-CM | POA: Diagnosis not present

## 2023-04-30 ENCOUNTER — Ambulatory Visit (INDEPENDENT_AMBULATORY_CARE_PROVIDER_SITE_OTHER): Payer: Medicare Other | Admitting: Family Medicine

## 2023-04-30 ENCOUNTER — Encounter (INDEPENDENT_AMBULATORY_CARE_PROVIDER_SITE_OTHER): Payer: Self-pay | Admitting: Family Medicine

## 2023-04-30 VITALS — BP 113/71 | HR 87 | Temp 97.9°F | Ht 64.0 in | Wt 160.0 lb

## 2023-04-30 DIAGNOSIS — E669 Obesity, unspecified: Secondary | ICD-10-CM

## 2023-04-30 DIAGNOSIS — F4321 Adjustment disorder with depressed mood: Secondary | ICD-10-CM | POA: Diagnosis not present

## 2023-04-30 DIAGNOSIS — E1169 Type 2 diabetes mellitus with other specified complication: Secondary | ICD-10-CM | POA: Diagnosis not present

## 2023-04-30 DIAGNOSIS — E559 Vitamin D deficiency, unspecified: Secondary | ICD-10-CM

## 2023-04-30 DIAGNOSIS — Z7985 Long-term (current) use of injectable non-insulin antidiabetic drugs: Secondary | ICD-10-CM

## 2023-04-30 DIAGNOSIS — Z6827 Body mass index (BMI) 27.0-27.9, adult: Secondary | ICD-10-CM

## 2023-04-30 MED ORDER — TIRZEPATIDE 5 MG/0.5ML ~~LOC~~ SOAJ: 5.0000 mg | SUBCUTANEOUS | 0 refills | Status: DC

## 2023-04-30 MED ORDER — CHOLECALCIFEROL 1.25 MG (50000 UT) PO TABS: 50000.0000 [IU] | ORAL_TABLET | ORAL | 0 refills | Status: DC

## 2023-05-01 NOTE — Progress Notes (Signed)
Chief Complaint:   OBESITY Holly Daniels is here to discuss her progress with her obesity treatment plan along with follow-up of her obesity related diagnoses. Holly Daniels is on the Category 3 Plan and states she is following her eating plan approximately 10% of the time. Holly Daniels states she is using and exercise ball and walking 2-3 times per week.  Today's visit was #: 27 Starting weight: 203 lbs Starting date: 06/22/2021 Today's weight: 160 lbs Today's date: 04/30/2023 Total lbs lost to date: 43 Total lbs lost since last in-office visit: 1  Interim History: Patient is working on increasing her protein and decreasing simple carbohydrates. She is mindful of her food choices and she has increased her exercise.   Subjective:   1. Type 2 diabetes mellitus with other specified complication, without long-term current use of insulin (HCC) Patient states her most recent A1c at her PCP was 6.4.  2. Vitamin D deficiency Patient is on Vitamin D, and she denies nausea, vomiting, or muscle weakness.   3. Grieving Patient's sister is now in Hospice with terminal brain cancer. She had her other sister die last year which is especially painful.   Assessment/Plan:   1. Type 2 diabetes mellitus with other specified complication, without long-term current use of insulin (HCC) Patient will continue with her diet, weight loss, and Mounjaro. We will refill Mounjaro 5 mg once weekly for 90 days.   - tirzepatide [facility]) 5 MG/0.5ML Pen; Inject 5 mg into the skin once a week.  Dispense: 6 mL; Refill: 0  2. Vitamin D deficiency Patient will continue prescription Vitamin D once weekly, and we will refill for 1 month.   - Cholecalciferol 1.25 MG (50000 UT) TABS; Take 50,000 Units by mouth once a week.  Dispense: 4 tablet; Refill: 0  3. Grieving Patient was offered support and encouragement. She is to ask if she needs additional help while she deals with her grief. We will continue to follow.   4. BMI  27.0-27.9,adult  5. Obesity, Beginning BMI 34.84 Holly Daniels is currently in the action stage of change. As such, her goal is to continue with weight loss efforts. She has agreed to the Category 3 Plan.   Exercise goals: As is.   Behavioral modification strategies: no skipping meals.  Holly Daniels has agreed to follow-up with our clinic in 4 weeks. She was informed of the importance of frequent follow-up visits to maximize her success with intensive lifestyle modifications for her multiple health conditions.   Objective:   Blood pressure 113/71, pulse 87, temperature 97.9 F (36.6 C), height 5\' 4"  (1.626 m), weight 160 lb (72.6 kg), SpO2 98%. Body mass index is 27.46 kg/m.  Lab Results  Component Value Date   CREATININE 1.00 12/27/2021   BUN 22 12/27/2021   NA 142 12/27/2021   K 4.1 12/27/2021   CL 100 12/27/2021   CO2 21 12/27/2021   Lab Results  Component Value Date   ALT 13 06/22/2021   AST 15 06/22/2021   ALKPHOS 86 06/22/2021   BILITOT 0.3 06/22/2021   Lab Results  Component Value Date   HGBA1C 7.0 (H) 06/22/2021   HGBA1C 9.1 (H) 12/05/2015   No results found for: "INSULIN" Lab Results  Component Value Date   TSH 1.070 06/22/2021   Lab Results  Component Value Date   CHOL 129 06/22/2021   HDL 33 (L) 06/22/2021   LDLCALC 73 06/22/2021   TRIG 130 06/22/2021   CHOLHDL 5.5 12/05/2015   Lab Results  Component Value Date   VD25OH 15.4 (L) 06/22/2021   Lab Results  Component Value Date   WBC 9.4 06/22/2021   HGB 16.2 (H) 06/22/2021   HCT 47.9 (H) 06/22/2021   MCV 89 06/22/2021   PLT 183 06/22/2021   No results found for: "IRON", "TIBC", "FERRITIN"  Attestation Statements:   Reviewed by clinician on day of visit: allergies, medications, problem list, medical history, surgical history, family history, social history, and previous encounter notes.   I, Burt Knack, am acting as transcriptionist for Quillian Quince, MD.  I have reviewed the above documentation  for accuracy and completeness, and I agree with the above. -  Quillian Quince, MD

## 2023-05-29 ENCOUNTER — Ambulatory Visit (INDEPENDENT_AMBULATORY_CARE_PROVIDER_SITE_OTHER): Payer: Medicare Other | Admitting: Family Medicine

## 2023-06-04 DIAGNOSIS — G8929 Other chronic pain: Secondary | ICD-10-CM | POA: Diagnosis not present

## 2023-06-04 DIAGNOSIS — I10 Essential (primary) hypertension: Secondary | ICD-10-CM | POA: Diagnosis not present

## 2023-06-04 DIAGNOSIS — M25551 Pain in right hip: Secondary | ICD-10-CM | POA: Diagnosis not present

## 2023-06-10 ENCOUNTER — Other Ambulatory Visit (INDEPENDENT_AMBULATORY_CARE_PROVIDER_SITE_OTHER): Payer: Self-pay | Admitting: Family Medicine

## 2023-06-10 DIAGNOSIS — E559 Vitamin D deficiency, unspecified: Secondary | ICD-10-CM

## 2023-06-25 ENCOUNTER — Ambulatory Visit (INDEPENDENT_AMBULATORY_CARE_PROVIDER_SITE_OTHER): Payer: Medicare Other | Admitting: Family Medicine

## 2023-06-25 ENCOUNTER — Encounter (INDEPENDENT_AMBULATORY_CARE_PROVIDER_SITE_OTHER): Payer: Self-pay | Admitting: Family Medicine

## 2023-06-25 VITALS — BP 113/59 | HR 46 | Temp 98.3°F | Ht 64.0 in | Wt 160.0 lb

## 2023-06-25 DIAGNOSIS — Z6827 Body mass index (BMI) 27.0-27.9, adult: Secondary | ICD-10-CM | POA: Diagnosis not present

## 2023-06-25 DIAGNOSIS — E669 Obesity, unspecified: Secondary | ICD-10-CM

## 2023-06-25 DIAGNOSIS — Z7985 Long-term (current) use of injectable non-insulin antidiabetic drugs: Secondary | ICD-10-CM | POA: Diagnosis not present

## 2023-06-25 DIAGNOSIS — E1169 Type 2 diabetes mellitus with other specified complication: Secondary | ICD-10-CM | POA: Diagnosis not present

## 2023-06-25 MED ORDER — TIRZEPATIDE 5 MG/0.5ML ~~LOC~~ SOAJ
5.0000 mg | SUBCUTANEOUS | 0 refills | Status: DC
Start: 2023-06-25 — End: 2023-07-24

## 2023-06-25 NOTE — Progress Notes (Unsigned)
Chief Complaint:   OBESITY Holly Daniels is here to discuss her progress with her obesity treatment plan along with follow-up of her obesity related diagnoses. Holly Daniels is on the Category 3 Plan and states she is following her eating plan approximately 30% of the time. Holly Daniels states she is walking for 1 hour 1/2 3-5 times per week.  Today's visit was #: 28 Starting weight: 203 lbs Starting date: 06/22/2021 Today's weight: 160 lbs Today's date: 06/25/2023 Total lbs lost to date: 43 Total lbs lost since last in-office visit: 0  Interim History: Patient has had extra challenges recently.  She is working on being mindful of her food choices.  She is working on getting back on track with her category 3 plan.  Subjective:   1. Type 2 diabetes mellitus with other specified complication, without long-term current use of insulin (HCC) Patient continues to work on her diet and weight loss.  She denies nausea, vomiting, or hypoglycemia.  She is due to have her A1c checked.  Assessment/Plan:   1. Type 2 diabetes mellitus with other specified complication, without long-term current use of insulin (HCC) Patient will continue Mounjaro 5 mg once weekly, and we will refill for 90 days.  - tirzepatide [facility]) 5 MG/0.5ML Pen; Inject 5 mg into the skin once a week.  Dispense: 6 mL; Refill: 0  2. BMI 27.0-27.9,adult  3. Obesity, Beginning BMI 34.84 Holly Daniels is currently in the action stage of change. As such, her goal is to continue with weight loss efforts. She has agreed to the Category 3 Plan.   Exercise goals: As is.   Behavioral modification strategies: increasing lean protein intake and meal planning and cooking strategies.  Holly Daniels has agreed to follow-up with our clinic in 4 weeks. She was informed of the importance of frequent follow-up visits to maximize her success with intensive lifestyle modifications for her multiple health conditions.   Objective:   Blood pressure (!) 113/59, pulse (!) 46,  temperature 98.3 F (36.8 C), height 5\' 4"  (1.626 m), weight 160 lb (72.6 kg), SpO2 98%. Body mass index is 27.46 kg/m.  Lab Results  Component Value Date   CREATININE 0.8 11/29/2022   BUN 23 (A) 11/29/2022   NA 138 11/29/2022   K 4.1 11/29/2022   CL 106 11/29/2022   CO2 23 (A) 11/29/2022   Lab Results  Component Value Date   ALT 21 11/29/2022   AST 15 11/29/2022   ALKPHOS 72 11/29/2022   BILITOT 0.3 06/22/2021   Lab Results  Component Value Date   HGBA1C 7.3 11/29/2022   HGBA1C 7.0 (H) 06/22/2021   HGBA1C 9.1 (H) 12/05/2015   No results found for: "INSULIN" Lab Results  Component Value Date   TSH 1.070 06/22/2021   Lab Results  Component Value Date   CHOL 129 06/22/2021   HDL 33 (L) 06/22/2021   LDLCALC 73 06/22/2021   TRIG 130 06/22/2021   CHOLHDL 5.5 12/05/2015   Lab Results  Component Value Date   VD25OH 19 11/29/2022   VD25OH 15.4 (L) 06/22/2021   Lab Results  Component Value Date   WBC 8.2 11/29/2022   HGB 14.1 11/29/2022   HCT 43 11/29/2022   MCV 89 06/22/2021   PLT 186 11/29/2022   No results found for: "IRON", "TIBC", "FERRITIN"  Attestation Statements:   Reviewed by clinician on day of visit: allergies, medications, problem list, medical history, surgical history, family history, social history, and previous encounter notes.  Time spent on visit  including pre-visit chart review and post-visit care and charting was 25 minutes.   I, Burt Knack, am acting as transcriptionist for Quillian Quince, MD.  I have reviewed the above documentation for accuracy and completeness, and I agree with the above. -  Quillian Quince, MD

## 2023-07-23 ENCOUNTER — Ambulatory Visit (INDEPENDENT_AMBULATORY_CARE_PROVIDER_SITE_OTHER): Payer: Medicare Other | Admitting: Family Medicine

## 2023-07-24 ENCOUNTER — Ambulatory Visit (INDEPENDENT_AMBULATORY_CARE_PROVIDER_SITE_OTHER): Payer: Medicare Other | Admitting: Family Medicine

## 2023-07-24 ENCOUNTER — Encounter (INDEPENDENT_AMBULATORY_CARE_PROVIDER_SITE_OTHER): Payer: Self-pay | Admitting: Family Medicine

## 2023-07-24 VITALS — BP 135/72 | HR 63 | Temp 98.6°F | Ht 64.0 in | Wt 160.0 lb

## 2023-07-24 DIAGNOSIS — F5089 Other specified eating disorder: Secondary | ICD-10-CM

## 2023-07-24 DIAGNOSIS — E559 Vitamin D deficiency, unspecified: Secondary | ICD-10-CM | POA: Diagnosis not present

## 2023-07-24 DIAGNOSIS — E119 Type 2 diabetes mellitus without complications: Secondary | ICD-10-CM | POA: Diagnosis not present

## 2023-07-24 DIAGNOSIS — Z6827 Body mass index (BMI) 27.0-27.9, adult: Secondary | ICD-10-CM

## 2023-07-24 DIAGNOSIS — I10 Essential (primary) hypertension: Secondary | ICD-10-CM

## 2023-07-24 DIAGNOSIS — Z6834 Body mass index (BMI) 34.0-34.9, adult: Secondary | ICD-10-CM

## 2023-07-24 DIAGNOSIS — Z7985 Long-term (current) use of injectable non-insulin antidiabetic drugs: Secondary | ICD-10-CM | POA: Diagnosis not present

## 2023-07-24 DIAGNOSIS — E669 Obesity, unspecified: Secondary | ICD-10-CM

## 2023-07-24 DIAGNOSIS — E1169 Type 2 diabetes mellitus with other specified complication: Secondary | ICD-10-CM

## 2023-07-24 DIAGNOSIS — F3289 Other specified depressive episodes: Secondary | ICD-10-CM

## 2023-07-24 MED ORDER — BUPROPION HCL ER (SR) 150 MG PO TB12
150.0000 mg | ORAL_TABLET | Freq: Every day | ORAL | 0 refills | Status: DC
Start: 2023-07-24 — End: 2023-08-21

## 2023-07-24 MED ORDER — TIRZEPATIDE 5 MG/0.5ML ~~LOC~~ SOAJ
5.0000 mg | SUBCUTANEOUS | 0 refills | Status: DC
Start: 2023-07-24 — End: 2023-08-21

## 2023-07-24 MED ORDER — CHOLECALCIFEROL 1.25 MG (50000 UT) PO TABS
50000.0000 [IU] | ORAL_TABLET | ORAL | 0 refills | Status: DC
Start: 2023-07-24 — End: 2023-11-26

## 2023-07-24 NOTE — Progress Notes (Signed)
.smr  Office: [phone]  /  Fax: [phone]  WEIGHT SUMMARY AND BIOMETRICS  Anthropometric Measurements Height: 5\' 4"  (1.626 m) Weight: 160 lb (72.6 kg) BMI (Calculated): 27.45 Weight at Last Visit: 160 lb Weight Lost Since Last Visit: 0 Weight Gained Since Last Visit: 0 Starting Weight: 203 lb Total Weight Loss (lbs): 43 lb (19.5 kg) Peak Weight: 240 lb   Body Composition  Body Fat %: 35 % Fat Mass (lbs): 56.2 lbs Muscle Mass (lbs): 99.2 lbs Total Body Water (lbs): 66.8 lbs Visceral Fat Rating : 9   Other Clinical Data Fasting: yes Labs: yes Today's Visit #: 28 Starting Date: 06/22/21    Chief Complaint: OBESITY  History of Present Illness   The patient, a Gaffer with obesity, hypertension, type two diabetes, and vitamin D deficiency, presents for a routine follow-up. She reports no significant changes in her health status since the last visit a month ago. She has maintained her weight and is adhering to the category three eating plan approximately 65% of the time. She is not currently engaging in formal exercise but reports increased physical activity, such as walking more and not using a cart in the store.  The patient is currently not experiencing any problems with her medications. She is on Mounjaro at 5mg , which occasionally causes mild stomach upset. She manages this with ginger ale and powdered ginger, which seems to help. She also notes that she may experience more queasiness if she goes too long without eating.  The patient's stress level is generally good, with the exception of her current coursework. She is nearing the end of a challenging computer class, which has been causing some stress. Despite this, she reports good sleep quality, attributing this to the exhaustion from her class. She expresses a desire to lose five more pounds and plans to continue increasing her physical activity.          PHYSICAL EXAM:  Blood pressure 135/72, pulse  63, temperature 98.6 F (37 C), height 5\' 4"  (1.626 m), weight 160 lb (72.6 kg), SpO2 94%. Body mass index is 27.46 kg/m.  DIAGNOSTIC DATA REVIEWED:  BMET    Component Value Date/Time   NA 138 11/29/2022 0000   K 4.1 11/29/2022 0000   CL 106 11/29/2022 0000   CO2 23 (A) 11/29/2022 0000   GLUCOSE 97 12/27/2021 1538   GLUCOSE 244 (H) 12/05/2015 1135   BUN 23 (A) 11/29/2022 0000   CREATININE 0.8 11/29/2022 0000   CREATININE 1.00 12/27/2021 1538   CALCIUM 10.1 11/29/2022 0000   GFRNONAA >60 12/05/2015 1135   GFRAA >60 12/05/2015 1135   Lab Results  Component Value Date   HGBA1C 7.3 11/29/2022   HGBA1C 9.1 (H) 12/05/2015   No results found for: "INSULIN" Lab Results  Component Value Date   TSH 1.070 06/22/2021   CBC    Component Value Date/Time   WBC 8.2 11/29/2022 0000   WBC 10.1 01/04/2012 1355   RBC 4.70 11/29/2022 0000   HGB 14.1 11/29/2022 0000   HGB 16.2 (H) 06/22/2021 1020   HCT 43 11/29/2022 0000   HCT 47.9 (H) 06/22/2021 1020   PLT 186 11/29/2022 0000   PLT 183 06/22/2021 1020   MCV 89 06/22/2021 1020   MCH 29.9 06/22/2021 1020   MCH 30.2 01/04/2012 1355   MCHC 33.8 06/22/2021 1020   MCHC 34.5 01/04/2012 1355   RDW 14.8 06/22/2021 1020   Iron Studies No results found for: "IRON", "TIBC", "FERRITIN", "IRONPCTSAT" Lipid Panel  Component Value Date/Time   CHOL 129 06/22/2021 1020   TRIG 130 06/22/2021 1020   HDL 33 (L) 06/22/2021 1020   CHOLHDL 5.5 12/05/2015 1135   VLDL 35 12/05/2015 1135   LDLCALC 73 06/22/2021 1020   Hepatic Function Panel     Component Value Date/Time   PROT 7.0 06/22/2021 1020   ALBUMIN 4.5 11/29/2022 0000   ALBUMIN 4.6 06/22/2021 1020   AST 15 11/29/2022 0000   ALT 21 11/29/2022 0000   ALKPHOS 72 11/29/2022 0000   BILITOT 0.3 06/22/2021 1020   BILIDIR 0.1 11/29/2022 0000   IBILI NOT CALCULATED 12/05/2015 1135      Component Value Date/Time   TSH 1.070 06/22/2021 1020   Nutritional Lab Results  Component  Value Date   VD25OH 19 11/29/2022   VD25OH 15.4 (L) 06/22/2021     Assessment and Plan    Obesity and DM II Maintained weight over the past month. Adherence to category three eating plan is 65%. No current exercise regimen. Mild nausea with Mounjaro 5mg , possibly related to long intervals between meals. -Continue Mounjaro 5mg  daily. -follow Category 3 plan and make sure to not skip meals. -Encouraged to not skip meals and ensure adequate protein intake. -Encouraged to increase physical activity, even if it's just walking to the mailbox. -Goal to lose 5 pounds after completion of current semester. -Schedule follow-up appointment before Thanksgiving and in December.  Hypertension Blood pressure slightly elevated today, possibly due to stress or excitement. -Monitor blood pressure at home. -Continue current antihypertensive regimen. -continue diet and exercise to lower BP  Vitamin D Deficiency Labs to be drawn tomorrow at primary care provider's office. -Continue current Vitamin D supplementation. -Ensure Vitamin D level is included in labs drawn tomorrow.   EEB -continue working on decreasing EEB -refill Wellbutrin today        She was informed of the importance of frequent follow up visits to maximize her success with intensive lifestyle modifications for her multiple health conditions.    Quillian Quince, MD

## 2023-07-25 DIAGNOSIS — E1122 Type 2 diabetes mellitus with diabetic chronic kidney disease: Secondary | ICD-10-CM | POA: Diagnosis not present

## 2023-07-25 DIAGNOSIS — E559 Vitamin D deficiency, unspecified: Secondary | ICD-10-CM | POA: Diagnosis not present

## 2023-07-25 DIAGNOSIS — E782 Mixed hyperlipidemia: Secondary | ICD-10-CM | POA: Diagnosis not present

## 2023-07-25 DIAGNOSIS — I10 Essential (primary) hypertension: Secondary | ICD-10-CM | POA: Diagnosis not present

## 2023-07-26 DIAGNOSIS — E1165 Type 2 diabetes mellitus with hyperglycemia: Secondary | ICD-10-CM | POA: Diagnosis not present

## 2023-07-30 DIAGNOSIS — E1122 Type 2 diabetes mellitus with diabetic chronic kidney disease: Secondary | ICD-10-CM | POA: Diagnosis not present

## 2023-07-30 DIAGNOSIS — Z13820 Encounter for screening for osteoporosis: Secondary | ICD-10-CM | POA: Diagnosis not present

## 2023-07-30 DIAGNOSIS — Z1239 Encounter for other screening for malignant neoplasm of breast: Secondary | ICD-10-CM | POA: Diagnosis not present

## 2023-07-30 DIAGNOSIS — M25551 Pain in right hip: Secondary | ICD-10-CM | POA: Diagnosis not present

## 2023-07-30 DIAGNOSIS — F17290 Nicotine dependence, other tobacco product, uncomplicated: Secondary | ICD-10-CM | POA: Diagnosis not present

## 2023-07-30 DIAGNOSIS — Z1211 Encounter for screening for malignant neoplasm of colon: Secondary | ICD-10-CM | POA: Diagnosis not present

## 2023-07-30 DIAGNOSIS — Z122 Encounter for screening for malignant neoplasm of respiratory organs: Secondary | ICD-10-CM | POA: Diagnosis not present

## 2023-07-30 DIAGNOSIS — E1165 Type 2 diabetes mellitus with hyperglycemia: Secondary | ICD-10-CM | POA: Diagnosis not present

## 2023-07-30 DIAGNOSIS — E1142 Type 2 diabetes mellitus with diabetic polyneuropathy: Secondary | ICD-10-CM | POA: Diagnosis not present

## 2023-07-30 DIAGNOSIS — Z0001 Encounter for general adult medical examination with abnormal findings: Secondary | ICD-10-CM | POA: Diagnosis not present

## 2023-07-31 ENCOUNTER — Other Ambulatory Visit: Payer: Self-pay | Admitting: Internal Medicine

## 2023-07-31 DIAGNOSIS — Z13820 Encounter for screening for osteoporosis: Secondary | ICD-10-CM

## 2023-08-01 ENCOUNTER — Other Ambulatory Visit: Payer: Self-pay | Admitting: Internal Medicine

## 2023-08-01 DIAGNOSIS — Z1231 Encounter for screening mammogram for malignant neoplasm of breast: Secondary | ICD-10-CM

## 2023-08-21 ENCOUNTER — Encounter (INDEPENDENT_AMBULATORY_CARE_PROVIDER_SITE_OTHER): Payer: Self-pay | Admitting: Physician Assistant

## 2023-08-21 ENCOUNTER — Ambulatory Visit (INDEPENDENT_AMBULATORY_CARE_PROVIDER_SITE_OTHER): Payer: Medicare Other | Admitting: Physician Assistant

## 2023-08-21 VITALS — BP 124/70 | HR 79 | Temp 98.1°F | Ht 64.0 in | Wt 157.0 lb

## 2023-08-21 DIAGNOSIS — E669 Obesity, unspecified: Secondary | ICD-10-CM

## 2023-08-21 DIAGNOSIS — E559 Vitamin D deficiency, unspecified: Secondary | ICD-10-CM

## 2023-08-21 DIAGNOSIS — Z6826 Body mass index (BMI) 26.0-26.9, adult: Secondary | ICD-10-CM

## 2023-08-21 DIAGNOSIS — Z7985 Long-term (current) use of injectable non-insulin antidiabetic drugs: Secondary | ICD-10-CM | POA: Diagnosis not present

## 2023-08-21 DIAGNOSIS — E1169 Type 2 diabetes mellitus with other specified complication: Secondary | ICD-10-CM

## 2023-08-21 DIAGNOSIS — Z6827 Body mass index (BMI) 27.0-27.9, adult: Secondary | ICD-10-CM

## 2023-08-21 MED ORDER — TIRZEPATIDE 5 MG/0.5ML ~~LOC~~ SOAJ: 5.0000 mg | SUBCUTANEOUS | 0 refills | Status: DC

## 2023-08-21 NOTE — Progress Notes (Signed)
SUBJECTIVE:  Chief Complaint: Obesity  Interim History: Holly Daniels, a 67 year old female with a history of type 2 diabetes and obesity, presents for a follow-up visit regarding her obesity treatment plan. She has been on Mounjaro 5mg  weekly for diabetes management. She reports intermittent nausea, diarrhea, and constipation since starting Mounjaro, but these side effects are not severe enough to discontinue the medication and have been transient in nature.  The patient has made significant progress in her weight loss journey, losing a total of 46 pounds since starting treatment.  She is down a total of 83 pounds from her peak weight of 240 pounds .  She reports an increase in energy levels compared to a year ago. She also notes improved blood sugar control, with her A1c levels now below 7.  She has a continuous glucose monitor and notes no hypoglycemia and much improved overall fasting levels as well as overall better glycemic control.  Despite occasional dietary lapses, she reports that cravings and food-related distractions have markedly improved since starting Mounjaro.  The patient also has a history of vitamin D deficiency, but recent labs from her primary care doctor showed adequate levels, leading to the discontinuation of vitamin D supplementation.   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 Despite these responsibilities, she has been able to maintain her weight loss and diabetes management regimen.  Holly Daniels is here to discuss her progress with her obesity treatment plan. She is on the Category 3 Plan and states she is following her eating plan approximately 75 % of the time. She states she is not exercising 0 minutes 0 times per week.   OBJECTIVE: Visit Diagnoses: Problem List Items Addressed This Visit     Diabetes mellitus (HCC) - Primary   Relevant Medications    tirzepatide (MOUNJARO) 5 MG/0.5ML Pen   Generalized obesity   Relevant Medications   tirzepatide (MOUNJARO) 5 MG/0.5ML Pen   Depression   Vitamin D deficiency  Obesity Obesity follow-up. Patient has lost 46 pounds since starting Holly Daniels, current weight 157 pounds/ TBW loss of 22.7% !!  (down from peak weight of 240 pounds for total weight loss of 34.6% !!). Body fat percentage is 33%, meeting the goal of under 35%. Visceral adipose rating is 9, below the target of 12. Reports improved energy levels and overall well-being. Mild side effects from Holly Daniels include occasional nausea, diarrhea, and constipation, but manageable. - Continue/refill Mounjaro 5 mg weekly - Advise to eat before taking Mounjaro to reduce nausea - Encourage portion control and mindful eating during Thanksgiving - Recommend walking within 30 minutes of meals to manage blood sugar levels - Encourage continued physical activity, including walking and chair exercises  Type 2 Diabetes Mellitus Lab Results  Component Value Date   HGBA1C 7.3 11/29/2022   HGBA1C 7.0 (H) 06/22/2021   HGBA1C 9.1 (H) 12/05/2015   Lab Results  Component Value Date   LDLCALC 73 06/22/2021   CREATININE 0.8 11/29/2022    Type 2 diabetes well-controlled with Mounjaro. A1c is 6.6 per last visit with PCP. Blood sugar levels stable with occasional spikes when consuming high-sugar foods. Uses a continuous glucose monitor. Discussed dietary modifications and physical activity to manage blood sugar levels. - Continue/refill Mounjaro 5 mg weekly - Encourage monitoring blood sugar levels with continuous glucose monitor - Advise on dietary modifications to manage blood sugar levels - Encourage physical activity to help  manage blood sugar levels  General Health Maintenance Maintaining a healthy lifestyle with significant weight loss and improved energy levels. Engages in physical activity and mindful eating. No current need for additional vitamin D  supplementation. Discussed strategies for managing diet and physical activity during holidays and stress-reducing activities. - Encourage continued healthy lifestyle practices - Advise on strategies for managing diet and physical activity during holidays - Recommend stress-reducing activities such as walking or dancing videos  Vitamin D Deficiency (Resolved) Previous vitamin D deficiency now resolved. Recent labs indicate adequate vitamin D levels. Supplementation discontinued. - No further vitamin D supplementation required per PCP recommendation -Requested patient to have primary care doctor's office to fax recent lab results for follow-up  Follow-up - Ensure follow-up appointment with Holly Daniels in 4 weeks  Vitals Temp: 98.1 F (36.7 C) BP: 124/70 Pulse Rate: 79 SpO2: 99 %   Anthropometric Measurements Height: 5\' 4"  (1.626 m) Weight: 157 lb (71.2 kg) BMI (Calculated): 26.94 Weight at Last Visit: 160 lb Weight Lost Since Last Visit: 3 lb Weight Gained Since Last Visit: 0 Starting Weight: 203 lb Total Weight Loss (lbs): 46 lb (20.9 kg) Peak Weight: 240 lb   Body Composition  Body Fat %: 33 % Fat Mass (lbs): 51.8 lbs Muscle Mass (lbs): 100 lbs Total Body Water (lbs): 64.6 lbs Visceral Fat Rating : 9   Other Clinical Data Fasting: yes Labs: no Today's Visit #: 30 Starting Date: 06/22/21     ASSESSMENT AND PLAN:  Diet: Holly Daniels is currently in the action stage of change. As such, her goal is to continue with weight loss efforts. She has agreed to Category 3 Plan.  Exercise: Holly Daniels has been instructed to try a geriatric exercise plan and that some exercise is better than none for weight loss and overall health benefits.   Behavior Modification:  We discussed the following Behavioral Modification Strategies today: increasing lean protein intake, decreasing simple carbohydrates, increasing vegetables, increase H2O intake, increase high fiber foods, no skipping  meals, and holiday eating strategies. We discussed various medication options to help Holly Daniels with her weight loss efforts and we both agreed to continue Mounjaro 5 mg weekly for Type 2 diabetes management.  Return in about 4 weeks (around 09/18/2023).Marland Kitchen She was informed of the importance of frequent follow up visits to maximize her success with intensive lifestyle modifications for her multiple health conditions.  Attestation Statements:   Reviewed by clinician on day of visit: allergies, medications, problem list, medical history, surgical history, family history, social history, and previous encounter notes.   Time spent on visit including pre-visit chart review and post-visit care and charting was 31 minutes.    Kabao Leite, PA-C

## 2023-09-10 ENCOUNTER — Encounter (INDEPENDENT_AMBULATORY_CARE_PROVIDER_SITE_OTHER): Payer: Self-pay | Admitting: Family Medicine

## 2023-09-10 ENCOUNTER — Ambulatory Visit (INDEPENDENT_AMBULATORY_CARE_PROVIDER_SITE_OTHER): Payer: Medicare Other | Admitting: Family Medicine

## 2023-09-10 VITALS — BP 137/71 | HR 70 | Temp 98.0°F | Ht 64.0 in | Wt 160.0 lb

## 2023-09-10 DIAGNOSIS — E1169 Type 2 diabetes mellitus with other specified complication: Secondary | ICD-10-CM

## 2023-09-10 DIAGNOSIS — E669 Obesity, unspecified: Secondary | ICD-10-CM

## 2023-09-10 DIAGNOSIS — Z6834 Body mass index (BMI) 34.0-34.9, adult: Secondary | ICD-10-CM

## 2023-09-10 DIAGNOSIS — Z7985 Long-term (current) use of injectable non-insulin antidiabetic drugs: Secondary | ICD-10-CM

## 2023-09-10 DIAGNOSIS — Z6827 Body mass index (BMI) 27.0-27.9, adult: Secondary | ICD-10-CM | POA: Diagnosis not present

## 2023-09-10 DIAGNOSIS — F17219 Nicotine dependence, cigarettes, with unspecified nicotine-induced disorders: Secondary | ICD-10-CM

## 2023-09-10 DIAGNOSIS — F17210 Nicotine dependence, cigarettes, uncomplicated: Secondary | ICD-10-CM

## 2023-09-10 MED ORDER — TIRZEPATIDE 5 MG/0.5ML ~~LOC~~ SOAJ: 5.0000 mg | SUBCUTANEOUS | 0 refills | Status: DC

## 2023-09-10 NOTE — Progress Notes (Signed)
.smr  Office: [phone]  /  Fax: [phone]  WEIGHT SUMMARY AND BIOMETRICS  Anthropometric Measurements Height: 5\' 4"  (1.626 m) Weight: 160 lb (72.6 kg) BMI (Calculated): 27.45 Weight at Last Visit: 157 lb Weight Lost Since Last Visit: 0 Weight Gained Since Last Visit: 3 lb Starting Weight: 203 lb Total Weight Loss (lbs): 43 lb (19.5 kg) Peak Weight: 240 lb   Body Composition  Body Fat %: 34.1 % Fat Mass (lbs): 54.6 lbs Muscle Mass (lbs): 100.2 lbs Total Body Water (lbs): 66.8 lbs Visceral Fat Rating : 9   Other Clinical Data Fasting: no Labs: no Today's Visit #: 31 Starting Date: 06/22/21    Chief Complaint: OBESITY   History of Present Illness   The patient, diagnosed with type 2 diabetes and currently on Mounjaro 5 mg, presents for a routine follow-up. She reports adherence to a category 3 eating plan approximately 75% of the time and has been engaging in 30-minute exercise sessions three times a week, focusing on core balance. Despite these efforts, she has gained 3 pounds over the past month, which she attributes to the Thanksgiving holiday.  The patient reports no issues with the [facility], noting an absence of nausea and other side effects. She expresses uncertainty about the need to increase the dosage, but is open to discussing it.  In addition to diabetes, the patient has been dealing with smoking cessation. She was previously on Wellbutrin, which was effective in reducing her smoking habit. However, the medication was discontinued due to the onset of heart palpitations. The patient expresses disappointment about this setback in her smoking cessation journey, but is open to exploring other options, such as Chantix.  The patient also mentions a previous deficiency in Vitamin D, which has since been resolved and supplementation discontinued. She reports no other new or ongoing health concerns.          PHYSICAL EXAM:  Blood pressure 137/71, pulse 70,  temperature 98 F (36.7 C), height 5\' 4"  (1.626 m), weight 160 lb (72.6 kg), SpO2 97%. Body mass index is 27.46 kg/m.  DIAGNOSTIC DATA REVIEWED:  BMET    Component Value Date/Time   NA 138 11/29/2022 0000   K 4.1 11/29/2022 0000   CL 106 11/29/2022 0000   CO2 23 (A) 11/29/2022 0000   GLUCOSE 97 12/27/2021 1538   GLUCOSE 244 (H) 12/05/2015 1135   BUN 23 (A) 11/29/2022 0000   CREATININE 0.8 11/29/2022 0000   CREATININE 1.00 12/27/2021 1538   CALCIUM 10.1 11/29/2022 0000   GFRNONAA >60 12/05/2015 1135   GFRAA >60 12/05/2015 1135   Lab Results  Component Value Date   HGBA1C 7.3 11/29/2022   HGBA1C 9.1 (H) 12/05/2015   No results found for: "INSULIN" Lab Results  Component Value Date   TSH 1.070 06/22/2021   CBC    Component Value Date/Time   WBC 8.2 11/29/2022 0000   WBC 10.1 01/04/2012 1355   RBC 4.70 11/29/2022 0000   HGB 14.1 11/29/2022 0000   HGB 16.2 (H) 06/22/2021 1020   HCT 43 11/29/2022 0000   HCT 47.9 (H) 06/22/2021 1020   PLT 186 11/29/2022 0000   PLT 183 06/22/2021 1020   MCV 89 06/22/2021 1020   MCH 29.9 06/22/2021 1020   MCH 30.2 01/04/2012 1355   MCHC 33.8 06/22/2021 1020   MCHC 34.5 01/04/2012 1355   RDW 14.8 06/22/2021 1020   Iron Studies No results found for: "IRON", "TIBC", "FERRITIN", "IRONPCTSAT" Lipid Panel     Component  Value Date/Time   CHOL 129 06/22/2021 1020   TRIG 130 06/22/2021 1020   HDL 33 (L) 06/22/2021 1020   CHOLHDL 5.5 12/05/2015 1135   VLDL 35 12/05/2015 1135   LDLCALC 73 06/22/2021 1020   Hepatic Function Panel     Component Value Date/Time   PROT 7.0 06/22/2021 1020   ALBUMIN 4.5 11/29/2022 0000   ALBUMIN 4.6 06/22/2021 1020   AST 15 11/29/2022 0000   ALT 21 11/29/2022 0000   ALKPHOS 72 11/29/2022 0000   BILITOT 0.3 06/22/2021 1020   BILIDIR 0.1 11/29/2022 0000   IBILI NOT CALCULATED 12/05/2015 1135      Component Value Date/Time   TSH 1.070 06/22/2021 1020   Nutritional Lab Results  Component Value  Date   VD25OH 19 11/29/2022   VD25OH 15.4 (L) 06/22/2021     Assessment and Plan    Type 2 Diabetes Mellitus Currently on Mounjaro 5 mg. Gained 3 pounds over the last month, likely due to holiday eating. Following category 3 eating plan 75% of the time and exercising 30 minutes 3 times a week. No adverse effects from [facility] currently, though there was some nausea last month. Discussed potential dose increase if current regimen is insufficient, but will reassess after the holidays to avoid queasiness. Emphasized balanced diet with adequate protein and lower carbohydrates to maximize medication effectiveness. Highlighted progress compared to average holiday weight gain. - Continue Mounjaro 5 mg - Reassess medication efficacy and potential dose adjustment at next visit - Encourage adherence to diet and exercise regimen  Nicotine Dependence Previously on Wellbutrin for smoking cessation but discontinued due to heart palpitations. Reduced cigarette consumption from a carton a month to four packs a month. Discussed potential of Mounjaro aiding in reducing addictive behaviors and mentioned Chantix as an alternative, advising discussion with primary care provider. Explained Wellbutrin's effect on norepinephrine causing palpitations. - Discuss Chantix with primary care provider  General Health Maintenance Blood pressure well-controlled. Previously on Vitamin D supplementation but discontinued as no longer deficient. Encouraged continued exercise and mindful eating. - Continue current exercise regimen - Maintain balanced diet  Follow-up - Schedule follow-up appointments for January and February.        She was informed of the importance of frequent follow up visits to maximize her success with intensive lifestyle modifications for her multiple health conditions.    Quillian Quince, MD

## 2023-09-18 ENCOUNTER — Ambulatory Visit (INDEPENDENT_AMBULATORY_CARE_PROVIDER_SITE_OTHER): Payer: Medicare Other | Admitting: Family Medicine

## 2023-10-22 DIAGNOSIS — E1165 Type 2 diabetes mellitus with hyperglycemia: Secondary | ICD-10-CM | POA: Diagnosis not present

## 2023-10-29 ENCOUNTER — Ambulatory Visit (INDEPENDENT_AMBULATORY_CARE_PROVIDER_SITE_OTHER): Payer: Medicare Other | Admitting: Family Medicine

## 2023-10-29 DIAGNOSIS — E1165 Type 2 diabetes mellitus with hyperglycemia: Secondary | ICD-10-CM | POA: Diagnosis not present

## 2023-10-29 DIAGNOSIS — E1122 Type 2 diabetes mellitus with diabetic chronic kidney disease: Secondary | ICD-10-CM | POA: Diagnosis not present

## 2023-10-29 DIAGNOSIS — I10 Essential (primary) hypertension: Secondary | ICD-10-CM | POA: Diagnosis not present

## 2023-10-29 DIAGNOSIS — E1121 Type 2 diabetes mellitus with diabetic nephropathy: Secondary | ICD-10-CM | POA: Diagnosis not present

## 2023-10-31 ENCOUNTER — Encounter (INDEPENDENT_AMBULATORY_CARE_PROVIDER_SITE_OTHER): Payer: Self-pay | Admitting: Family Medicine

## 2023-10-31 ENCOUNTER — Ambulatory Visit (INDEPENDENT_AMBULATORY_CARE_PROVIDER_SITE_OTHER): Payer: Medicare Other | Admitting: Family Medicine

## 2023-10-31 VITALS — BP 145/86 | HR 76 | Temp 98.5°F | Ht 64.0 in | Wt 158.0 lb

## 2023-10-31 DIAGNOSIS — E119 Type 2 diabetes mellitus without complications: Secondary | ICD-10-CM

## 2023-10-31 DIAGNOSIS — Z7985 Long-term (current) use of injectable non-insulin antidiabetic drugs: Secondary | ICD-10-CM | POA: Diagnosis not present

## 2023-10-31 DIAGNOSIS — Z6827 Body mass index (BMI) 27.0-27.9, adult: Secondary | ICD-10-CM

## 2023-10-31 DIAGNOSIS — E669 Obesity, unspecified: Secondary | ICD-10-CM | POA: Diagnosis not present

## 2023-10-31 DIAGNOSIS — I10 Essential (primary) hypertension: Secondary | ICD-10-CM | POA: Diagnosis not present

## 2023-10-31 DIAGNOSIS — E66811 Obesity, class 1: Secondary | ICD-10-CM | POA: Insufficient documentation

## 2023-10-31 DIAGNOSIS — Z7984 Long term (current) use of oral hypoglycemic drugs: Secondary | ICD-10-CM

## 2023-10-31 DIAGNOSIS — E559 Vitamin D deficiency, unspecified: Secondary | ICD-10-CM

## 2023-10-31 DIAGNOSIS — E1169 Type 2 diabetes mellitus with other specified complication: Secondary | ICD-10-CM

## 2023-10-31 DIAGNOSIS — Z6834 Body mass index (BMI) 34.0-34.9, adult: Secondary | ICD-10-CM

## 2023-10-31 NOTE — Progress Notes (Signed)
.smr  Office: [phone]  /  Fax: [phone]  WEIGHT SUMMARY AND BIOMETRICS  Anthropometric Measurements Height: 5\' 4"  (1.626 m) Weight: 158 lb (71.7 kg) BMI (Calculated): 27.11 Weight at Last Visit: 160 lb Weight Lost Since Last Visit: 2 lb Weight Gained Since Last Visit: 0 Starting Weight: 203 lb Total Weight Loss (lbs): 45 lb (20.4 kg) Peak Weight: 240 lb   Body Composition  Body Fat %: 34.4 % Fat Mass (lbs): 54.6 lbs Muscle Mass (lbs): 98.8 lbs Total Body Water (lbs): 66.8 lbs Visceral Fat Rating : 9   Other Clinical Data Fasting: no Labs: no Today's Visit #: 32 Starting Date: 06/22/21    Chief Complaint: OBESITY   History of Present Illness   The patient, with obesity, type 2 diabetes, and vitamin D deficiency, presents to discuss her health management.  She is actively addressing her obesity through dietary changes and weight loss efforts, having lost two pounds over the past seven weeks. She adheres to the category three plan about sixty percent of the time and is not currently engaging in exercise. She is motivated by a friendly competition with her best friend and is pleased with her progress, noting her weight is now in the 150s.  Her type 2 diabetes is managed with Synjardy and Mounjaro. Her A1c has slightly increased from 6.6 to 6.8. She reports no significant issues with blood sugar control, with a fasting blood sugar of 119 this morning. She experiences occasional gastrointestinal side effects from [facility], including nausea, constipation, diarrhea, and gas, but finds them manageable. Greggory Keen has helped reduce her excessive hunger, which she humorously described as feeling like a 'hungry, hungry hippo' before starting the medication.  Her blood pressure was elevated today, but she is on telmisartan and amlodipine for management. She noted better readings at home, with a recent measurement of 117/72, and suspects today's elevation might be due to  consuming high-sodium 'fat back meat'.  She is on prescription vitamin D, but her last level was not at goal. She is not currently on vitamin D supplementation following a recent visit with another provider.  She follows a dietary plan that includes a good amount of protein and carbohydrates, which she feels is effective as she is losing weight.          PHYSICAL EXAM:  Blood pressure (!) 145/86, pulse 76, temperature 98.5 F (36.9 C), height 5\' 4"  (1.626 m), weight 158 lb (71.7 kg), SpO2 94%. Body mass index is 27.12 kg/m.  DIAGNOSTIC DATA REVIEWED:  BMET    Component Value Date/Time   NA 138 11/29/2022 0000   K 4.1 11/29/2022 0000   CL 106 11/29/2022 0000   CO2 23 (A) 11/29/2022 0000   GLUCOSE 97 12/27/2021 1538   GLUCOSE 244 (H) 12/05/2015 1135   BUN 23 (A) 11/29/2022 0000   CREATININE 0.8 11/29/2022 0000   CREATININE 1.00 12/27/2021 1538   CALCIUM 10.1 11/29/2022 0000   GFRNONAA >60 12/05/2015 1135   GFRAA >60 12/05/2015 1135   Lab Results  Component Value Date   HGBA1C 7.3 11/29/2022   HGBA1C 9.1 (H) 12/05/2015   No results found for: "INSULIN" Lab Results  Component Value Date   TSH 1.070 06/22/2021   CBC    Component Value Date/Time   WBC 8.2 11/29/2022 0000   WBC 10.1 01/04/2012 1355   RBC 4.70 11/29/2022 0000   HGB 14.1 11/29/2022 0000   HGB 16.2 (H) 06/22/2021 1020   HCT 43 11/29/2022 0000   HCT  47.9 (H) 06/22/2021 1020   PLT 186 11/29/2022 0000   PLT 183 06/22/2021 1020   MCV 89 06/22/2021 1020   MCH 29.9 06/22/2021 1020   MCH 30.2 01/04/2012 1355   MCHC 33.8 06/22/2021 1020   MCHC 34.5 01/04/2012 1355   RDW 14.8 06/22/2021 1020   Iron Studies No results found for: "IRON", "TIBC", "FERRITIN", "IRONPCTSAT" Lipid Panel     Component Value Date/Time   CHOL 129 06/22/2021 1020   TRIG 130 06/22/2021 1020   HDL 33 (L) 06/22/2021 1020   CHOLHDL 5.5 12/05/2015 1135   VLDL 35 12/05/2015 1135   LDLCALC 73 06/22/2021 1020   Hepatic Function  Panel     Component Value Date/Time   PROT 7.0 06/22/2021 1020   ALBUMIN 4.5 11/29/2022 0000   ALBUMIN 4.6 06/22/2021 1020   AST 15 11/29/2022 0000   ALT 21 11/29/2022 0000   ALKPHOS 72 11/29/2022 0000   BILITOT 0.3 06/22/2021 1020   BILIDIR 0.1 11/29/2022 0000   IBILI NOT CALCULATED 12/05/2015 1135      Component Value Date/Time   TSH 1.070 06/22/2021 1020   Nutritional Lab Results  Component Value Date   VD25OH 19 11/29/2022   VD25OH 15.4 (L) 06/22/2021     Assessment and Plan    Hypertension Elevated blood pressure readings today at 153/69 and 145/86. On telmisartan and amlodipine. Recent high sodium intake may have contributed. Home readings generally better, with a recent reading of 117/72. - Monitor blood pressure at home - Continue telmisartan and amlodipine - Reassess blood pressure at next visit  Type 2 Diabetes Mellitus Managed with Kirk Ruths and Mounjaro. A1c increased from 6.6 to 6.8. Reports good blood sugar control with no recent hypoglycemic episodes. Occasional gastrointestinal side effects from [facility], including nausea, constipation, diarrhea, and gas, but manageable. Mounjaro helps with excessive hunger. - Continue Synjardy and Mounjaro - Monitor blood sugar levels regularly - Reassess A1c at next visit  Obesity Recent weight loss of 2 pounds over 7 weeks. Following diet plan 60% of the time, not currently exercising. Prefers current diet plan due to its balance of protein and carbohydrates and is satisfied with weight loss results. - Continue current diet plan - Encourage increased adherence to diet plan - Initiate exercise regimen  Vitamin D Deficiency Vitamin D deficiency with current prescription. Last vitamin D level was suboptimal. Recent blood work but vitamin D was not re-prescribed by another doctor. - Order vitamin D level - Consider re-prescribing vitamin D based on lab results  Follow-up - Reassess blood pressure and diabetes  management at next visit - Follow up on vitamin D levels and re-prescription if necessary.         I have personally spent 46 minutes total time today in preparation, patient care, and documentation for this visit, including the following: review of clinical lab tests; review of medical tests/procedures/services.    She was informed of the importance of frequent follow up visits to maximize her success with intensive lifestyle modifications for her multiple health conditions.    Quillian Quince, MD

## 2023-11-02 LAB — TSH: TSH: 1.44 u[IU]/mL (ref 0.450–4.500)

## 2023-11-02 LAB — CMP14+EGFR
ALT: 43 [IU]/L — ABNORMAL HIGH (ref 0–32)
AST: 23 [IU]/L (ref 0–40)
Albumin: 4.3 g/dL (ref 3.9–4.9)
Alkaline Phosphatase: 116 [IU]/L (ref 44–121)
BUN/Creatinine Ratio: 17 (ref 12–28)
BUN: 14 mg/dL (ref 8–27)
Bilirubin Total: 0.3 mg/dL (ref 0.0–1.2)
CO2: 20 mmol/L (ref 20–29)
Calcium: 9.7 mg/dL (ref 8.7–10.3)
Chloride: 106 mmol/L (ref 96–106)
Creatinine, Ser: 0.82 mg/dL (ref 0.57–1.00)
Globulin, Total: 2.3 g/dL (ref 1.5–4.5)
Glucose: 119 mg/dL — ABNORMAL HIGH (ref 70–99)
Potassium: 4.2 mmol/L (ref 3.5–5.2)
Sodium: 143 mmol/L (ref 134–144)
Total Protein: 6.6 g/dL (ref 6.0–8.5)
eGFR: 78 mL/min/{1.73_m2} (ref >59)

## 2023-11-02 LAB — VITAMIN D 25 HYDROXY (VIT D DEFICIENCY, FRACTURES): Vit D, 25-Hydroxy: 34.1 ng/mL (ref 30.0–100.0)

## 2023-11-02 LAB — HEMOGLOBIN A1C
Est. average glucose Bld gHb Est-mCnc: 148 mg/dL
Hgb A1c MFr Bld: 6.8 % — ABNORMAL HIGH (ref 4.8–5.6)

## 2023-11-02 LAB — LIPID PANEL WITH LDL/HDL RATIO
Cholesterol, Total: 118 mg/dL (ref 100–199)
HDL: 35 mg/dL — ABNORMAL LOW (ref >39)
LDL Chol Calc (NIH): 62 mg/dL (ref 0–99)
LDL/HDL Ratio: 1.8 {ratio} (ref 0.0–3.2)
Triglycerides: 117 mg/dL (ref 0–149)
VLDL Cholesterol Cal: 21 mg/dL (ref 5–40)

## 2023-11-02 LAB — INSULIN, RANDOM: INSULIN: 28 u[IU]/mL — ABNORMAL HIGH (ref 2.6–24.9)

## 2023-11-02 LAB — VITAMIN B12: Vitamin B-12: 321 pg/mL (ref 232–1245)

## 2023-11-21 DIAGNOSIS — E1165 Type 2 diabetes mellitus with hyperglycemia: Secondary | ICD-10-CM | POA: Diagnosis not present

## 2023-11-26 ENCOUNTER — Ambulatory Visit (INDEPENDENT_AMBULATORY_CARE_PROVIDER_SITE_OTHER): Payer: Medicare Other | Admitting: Family Medicine

## 2023-11-26 ENCOUNTER — Encounter (INDEPENDENT_AMBULATORY_CARE_PROVIDER_SITE_OTHER): Payer: Self-pay | Admitting: Family Medicine

## 2023-11-26 VITALS — BP 133/74 | HR 78 | Temp 98.1°F | Ht 64.0 in | Wt 157.0 lb

## 2023-11-26 DIAGNOSIS — Z6826 Body mass index (BMI) 26.0-26.9, adult: Secondary | ICD-10-CM

## 2023-11-26 DIAGNOSIS — E1169 Type 2 diabetes mellitus with other specified complication: Secondary | ICD-10-CM

## 2023-11-26 DIAGNOSIS — Z6834 Body mass index (BMI) 34.0-34.9, adult: Secondary | ICD-10-CM

## 2023-11-26 DIAGNOSIS — E559 Vitamin D deficiency, unspecified: Secondary | ICD-10-CM | POA: Diagnosis not present

## 2023-11-26 DIAGNOSIS — I10 Essential (primary) hypertension: Secondary | ICD-10-CM

## 2023-11-26 DIAGNOSIS — Z6827 Body mass index (BMI) 27.0-27.9, adult: Secondary | ICD-10-CM

## 2023-11-26 DIAGNOSIS — E119 Type 2 diabetes mellitus without complications: Secondary | ICD-10-CM | POA: Diagnosis not present

## 2023-11-26 DIAGNOSIS — Z7985 Long-term (current) use of injectable non-insulin antidiabetic drugs: Secondary | ICD-10-CM | POA: Diagnosis not present

## 2023-11-26 DIAGNOSIS — E669 Obesity, unspecified: Secondary | ICD-10-CM | POA: Diagnosis not present

## 2023-11-26 MED ORDER — TIRZEPATIDE 5 MG/0.5ML ~~LOC~~ SOAJ: 5.0000 mg | SUBCUTANEOUS | 0 refills | Status: DC

## 2023-11-26 MED ORDER — CHOLECALCIFEROL 1.25 MG (50000 UT) PO TABS: 50000.0000 [IU] | ORAL_TABLET | ORAL | 0 refills | Status: DC

## 2023-11-26 NOTE — Progress Notes (Signed)
 .smr  Office: [phone]  /  Fax: [phone]  WEIGHT SUMMARY AND BIOMETRICS  Anthropometric Measurements Height: 5\' 4"  (1.626 m) Weight: 157 lb (71.2 kg) BMI (Calculated): 26.94 Weight at Last Visit: 158 lb Weight Lost Since Last Visit: 1 lb Weight Gained Since Last Visit: 0 Starting Weight: 203 lb Total Weight Loss (lbs): 46 lb (20.9 kg) Peak Weight: 240 lb   Body Composition  Body Fat %: 34 % Fat Mass (lbs): 53.6 lbs Muscle Mass (lbs): 98.8 lbs Total Body Water (lbs): 67.8 lbs Visceral Fat Rating : 9   Other Clinical Data Fasting: no Labs: no Today's Visit #: 33 Starting Date: 06/22/21    Chief Complaint: OBESITY    History of Present Illness   Holly Daniels is a 68 year old female who presents for obesity treatment and progress evaluation.  She has lost one pound in the last month since her last visit. She follows her category three eating plan about fifty percent of the time and exercises every other day, including walking and leg lifts for a few minutes. She has noticed improved energy levels and mobility, transitioning from using a scooter to a rollator and now walking.  Her most recent hemoglobin A1c was 6.8, down from 7.3, indicating improved glycemic control. She has been taking Mounjaro 5 mg, but a new deductible at the beginning of the year has made this medication very expensive, increasing from $47 to $467 out of pocket.  She has a history of vitamin D deficiency and was previously on prescription vitamin D. Her primary care physician advised her to stop taking it after her last level was 34, which is better but not at the goal of 50 to 60.  Her blood pressure was higher than usual, possibly due to stress from a bank issue earlier in the day.          PHYSICAL EXAM:  Blood pressure 133/74, pulse 78, temperature 98.1 F (36.7 C), height 5\' 4"  (1.626 m), weight 157 lb (71.2 kg), SpO2 96%. Body mass index is 26.95 kg/m.  DIAGNOSTIC DATA  REVIEWED:  BMET    Component Value Date/Time   NA 143 10/31/2023 1200   K 4.2 10/31/2023 1200   CL 106 10/31/2023 1200   CO2 20 10/31/2023 1200   GLUCOSE 119 (H) 10/31/2023 1200   GLUCOSE 244 (H) 12/05/2015 1135   BUN 14 10/31/2023 1200   CREATININE 0.82 10/31/2023 1200   CALCIUM 9.7 10/31/2023 1200   GFRNONAA >60 12/05/2015 1135   GFRAA >60 12/05/2015 1135   Lab Results  Component Value Date   HGBA1C 6.8 (H) 10/31/2023   HGBA1C 9.1 (H) 12/05/2015   Lab Results  Component Value Date   INSULIN 28.0 (H) 10/31/2023   Lab Results  Component Value Date   TSH 1.440 10/31/2023   CBC    Component Value Date/Time   WBC 8.2 11/29/2022 0000   WBC 10.1 01/04/2012 1355   RBC 4.70 11/29/2022 0000   HGB 14.1 11/29/2022 0000   HGB 16.2 (H) 06/22/2021 1020   HCT 43 11/29/2022 0000   HCT 47.9 (H) 06/22/2021 1020   PLT 186 11/29/2022 0000   PLT 183 06/22/2021 1020   MCV 89 06/22/2021 1020   MCH 29.9 06/22/2021 1020   MCH 30.2 01/04/2012 1355   MCHC 33.8 06/22/2021 1020   MCHC 34.5 01/04/2012 1355   RDW 14.8 06/22/2021 1020   Iron Studies No results found for: "IRON", "TIBC", "FERRITIN", "IRONPCTSAT" Lipid Panel  Component Value Date/Time   CHOL 118 10/31/2023 1200   TRIG 117 10/31/2023 1200   HDL 35 (L) 10/31/2023 1200   CHOLHDL 5.5 12/05/2015 1135   VLDL 35 12/05/2015 1135   LDLCALC 62 10/31/2023 1200   Hepatic Function Panel     Component Value Date/Time   PROT 6.6 10/31/2023 1200   ALBUMIN 4.3 10/31/2023 1200   AST 23 10/31/2023 1200   ALT 43 (H) 10/31/2023 1200   ALKPHOS 116 10/31/2023 1200   BILITOT 0.3 10/31/2023 1200   BILIDIR 0.1 11/29/2022 0000   IBILI NOT CALCULATED 12/05/2015 1135      Component Value Date/Time   TSH 1.440 10/31/2023 1200   Nutritional Lab Results  Component Value Date   VD25OH 34.1 10/31/2023   VD25OH 19 11/29/2022   VD25OH 15.4 (L) 06/22/2021     Assessment and Plan    Obesity Patient has lost one pound in the last  month, adheres to her category three eating plan about 50% of the time, and exercises every other day. She is currently on Mounjaro 5 mg but finds it expensive due to a new deductible. Discussed the high cost of Mounjaro and the option of Zepbound, which is more expensive. - Verify insurance deductible - Continue Mounjaro 5 mg - Check for potential coupons or alternative payment options for Mounjaro  Type 2 Diabetes Mellitus Patient's hemoglobin A1c has improved from 7.3 to 6.8. She is managing her diabetes with Mounjaro 5 mg. - Continue Mounjaro 5 mg - Monitor hemoglobin A1c levels  Hypertension Patient reports elevated blood pressure, possibly due to stress. Initial reading was 147/63, second reading was 133/74. - Monitor blood pressure regularly -Continue weight loss efforts  Vitamin D Deficiency Patient's vitamin D level is 34, below the goal of 50-60. Previously on prescription vitamin D, which was discontinued by her PCP. Discussed the importance of maintaining adequate vitamin D levels for calcium utilization and bone health. - Restart prescription vitamin D - Send in a refill for vitamin D - Consider adjusting dose to maintain levels in the 50-60 range  Follow-up - Schedule follow-up appointment next month - Schedule April appointment.        She was informed of the importance of frequent follow up visits to maximize her success with intensive lifestyle modifications for her multiple health conditions.    Quillian Quince, MD

## 2023-12-02 ENCOUNTER — Other Ambulatory Visit: Payer: Self-pay | Admitting: Internal Medicine

## 2023-12-02 DIAGNOSIS — Z122 Encounter for screening for malignant neoplasm of respiratory organs: Secondary | ICD-10-CM

## 2023-12-24 ENCOUNTER — Encounter (INDEPENDENT_AMBULATORY_CARE_PROVIDER_SITE_OTHER): Payer: Self-pay | Admitting: Family Medicine

## 2023-12-24 ENCOUNTER — Ambulatory Visit (INDEPENDENT_AMBULATORY_CARE_PROVIDER_SITE_OTHER): Payer: Medicare Other | Admitting: Family Medicine

## 2023-12-24 VITALS — BP 123/72 | HR 80 | Temp 97.9°F | Ht 64.0 in | Wt 156.0 lb

## 2023-12-24 DIAGNOSIS — Z6826 Body mass index (BMI) 26.0-26.9, adult: Secondary | ICD-10-CM

## 2023-12-24 DIAGNOSIS — E7849 Other hyperlipidemia: Secondary | ICD-10-CM

## 2023-12-24 DIAGNOSIS — I10 Essential (primary) hypertension: Secondary | ICD-10-CM

## 2023-12-24 DIAGNOSIS — Z7985 Long-term (current) use of injectable non-insulin antidiabetic drugs: Secondary | ICD-10-CM | POA: Diagnosis not present

## 2023-12-24 DIAGNOSIS — E669 Obesity, unspecified: Secondary | ICD-10-CM

## 2023-12-24 DIAGNOSIS — E119 Type 2 diabetes mellitus without complications: Secondary | ICD-10-CM | POA: Diagnosis not present

## 2023-12-24 DIAGNOSIS — E559 Vitamin D deficiency, unspecified: Secondary | ICD-10-CM | POA: Diagnosis not present

## 2023-12-24 DIAGNOSIS — E785 Hyperlipidemia, unspecified: Secondary | ICD-10-CM

## 2023-12-24 DIAGNOSIS — E1169 Type 2 diabetes mellitus with other specified complication: Secondary | ICD-10-CM

## 2023-12-24 MED ORDER — TIRZEPATIDE 5 MG/0.5ML ~~LOC~~ SOAJ: 5.0000 mg | SUBCUTANEOUS | 0 refills | Status: DC

## 2023-12-24 NOTE — Progress Notes (Signed)
 Office: [phone]  /  Fax: [phone]  WEIGHT SUMMARY AND BIOMETRICS  Anthropometric Measurements Height: 5\' 4"  (1.626 m) Weight: 156 lb (70.8 kg) BMI (Calculated): 26.76 Weight at Last Visit: 157 lb Weight Lost Since Last Visit: 1 lb Weight Gained Since Last Visit: 0 Starting Weight: 203 lb Total Weight Loss (lbs): 47 lb (21.3 kg) Peak Weight: 240 lb   Body Composition  Body Fat %: 34.6 % Fat Mass (lbs): 54 lbs Muscle Mass (lbs): 96.8 lbs Total Body Water (lbs): 66.2 lbs Visceral Fat Rating : 9   Other Clinical Data Fasting: No Labs: No Today's Visit #: 34 Starting Date: 06/22/21    Chief Complaint: OBESITY     History of Present Illness Holly Daniels is a 68 year old female who presents for obesity treatment plan assessment and progress evaluation.  She follows her category three obesity treatment plan approximately 75% of the time and has lost one pound in the last month. She is not currently engaging in any exercise. Her lifestyle includes significant sedentary time due to academic commitments, as she is completing her last semester with five classes. This has led to increased snacking, particularly on carbohydrates, to stay awake during late-night study sessions.  She has hyperlipidemia and is currently on Zetia and Crestor to manage her cholesterol levels. She is also working on decreasing her cholesterol through dietary changes. No significant gastrointestinal issues are reported with her current medication regimen, which includes Mounjaro, taken weekly along with her vitamin D supplement.  Her blood pressure is well-controlled with telmisartan and amlodipine.  Her DM is well controlled      PHYSICAL EXAM:  Blood pressure 123/72, pulse 80, temperature 97.9 F (36.6 C), height 5\' 4"  (1.626 m), weight 156 lb (70.8 kg), SpO2 97%. Body mass index is 26.78 kg/m.  DIAGNOSTIC DATA REVIEWED:  BMET    Component Value Date/Time   NA 143 10/31/2023  1200   K 4.2 10/31/2023 1200   CL 106 10/31/2023 1200   CO2 20 10/31/2023 1200   GLUCOSE 119 (H) 10/31/2023 1200   GLUCOSE 244 (H) 12/05/2015 1135   BUN 14 10/31/2023 1200   CREATININE 0.82 10/31/2023 1200   CALCIUM 9.7 10/31/2023 1200   GFRNONAA >60 12/05/2015 1135   GFRAA >60 12/05/2015 1135   Lab Results  Component Value Date   HGBA1C 6.8 (H) 10/31/2023   HGBA1C 9.1 (H) 12/05/2015   Lab Results  Component Value Date   INSULIN 28.0 (H) 10/31/2023   Lab Results  Component Value Date   TSH 1.440 10/31/2023   CBC    Component Value Date/Time   WBC 8.2 11/29/2022 0000   WBC 10.1 01/04/2012 1355   RBC 4.70 11/29/2022 0000   HGB 14.1 11/29/2022 0000   HGB 16.2 (H) 06/22/2021 1020   HCT 43 11/29/2022 0000   HCT 47.9 (H) 06/22/2021 1020   PLT 186 11/29/2022 0000   PLT 183 06/22/2021 1020   MCV 89 06/22/2021 1020   MCH 29.9 06/22/2021 1020   MCH 30.2 01/04/2012 1355   MCHC 33.8 06/22/2021 1020   MCHC 34.5 01/04/2012 1355   RDW 14.8 06/22/2021 1020   Iron Studies No results found for: "IRON", "TIBC", "FERRITIN", "IRONPCTSAT" Lipid Panel     Component Value Date/Time   CHOL 118 10/31/2023 1200   TRIG 117 10/31/2023 1200   HDL 35 (L) 10/31/2023 1200   CHOLHDL 5.5 12/05/2015 1135   VLDL 35 12/05/2015 1135   LDLCALC 62 10/31/2023 1200  Hepatic Function Panel     Component Value Date/Time   PROT 6.6 10/31/2023 1200   ALBUMIN 4.3 10/31/2023 1200   AST 23 10/31/2023 1200   ALT 43 (H) 10/31/2023 1200   ALKPHOS 116 10/31/2023 1200   BILITOT 0.3 10/31/2023 1200   BILIDIR 0.1 11/29/2022 0000   IBILI NOT CALCULATED 12/05/2015 1135      Component Value Date/Time   TSH 1.440 10/31/2023 1200   Nutritional Lab Results  Component Value Date   VD25OH 34.1 10/31/2023   VD25OH 19 11/29/2022   VD25OH 15.4 (L) 06/22/2021     Assessment and Plan Assessment & Plan Obesity Following category three obesity treatment plan approximately 75% of the time. Lost one pound  in the last month. Not currently exercising and reports increased snacking, particularly carbohydrates, due to late-night studying. Emphasized the importance of incorporating physical activity, suggesting short breaks every hour to move around and increase heart rate, which can help reset thinking and manage stress. - Encourage physical activity by taking short breaks every hour to move around and increase heart rate. - Continue current obesity treatment plan.  Type 2 Diabetes Mellitus On Mounjaro for diabetes management. Reports no significant gastrointestinal issues with the current dose. Advised against increasing the dose due to potential gastrointestinal side effects, especially given current dietary habits. - Continue Mounjaro at current dose. - Send prescription for [facility] to [facility] on [address].  Hyperlipidemia On Zetia and Crestor for hyperlipidemia and working on decreasing cholesterol through diet. Weight loss generally correlates with improved cholesterol levels. - Continue Zetia and Crestor. - Encourage dietary modifications to reduce cholesterol.  Hypertension Blood pressure is well-controlled at 123/72 mmHg. Currently on telmisartan and amlodipine. - Continue telmisartan and amlodipine.  Vitamin D deficiency Takes vitamin D weekly along with her Mounjaro dose. No issues reported with this regimen. - Continue weekly vitamin D supplementation.  She was informed of the importance of frequent follow up visits to maximize her success with intensive lifestyle modifications for her multiple health conditions.    Quillian Quince, MD

## 2024-01-21 ENCOUNTER — Encounter (INDEPENDENT_AMBULATORY_CARE_PROVIDER_SITE_OTHER): Payer: Self-pay | Admitting: Family Medicine

## 2024-01-21 ENCOUNTER — Ambulatory Visit (INDEPENDENT_AMBULATORY_CARE_PROVIDER_SITE_OTHER): Payer: Medicare Other | Admitting: Family Medicine

## 2024-01-28 DIAGNOSIS — E1165 Type 2 diabetes mellitus with hyperglycemia: Secondary | ICD-10-CM | POA: Diagnosis not present

## 2024-02-05 DIAGNOSIS — E1165 Type 2 diabetes mellitus with hyperglycemia: Secondary | ICD-10-CM | POA: Diagnosis not present

## 2024-02-05 DIAGNOSIS — E1142 Type 2 diabetes mellitus with diabetic polyneuropathy: Secondary | ICD-10-CM | POA: Diagnosis not present

## 2024-02-05 DIAGNOSIS — E782 Mixed hyperlipidemia: Secondary | ICD-10-CM | POA: Diagnosis not present

## 2024-02-05 DIAGNOSIS — E1122 Type 2 diabetes mellitus with diabetic chronic kidney disease: Secondary | ICD-10-CM | POA: Diagnosis not present

## 2024-02-05 DIAGNOSIS — I10 Essential (primary) hypertension: Secondary | ICD-10-CM | POA: Diagnosis not present

## 2024-02-05 DIAGNOSIS — M5440 Lumbago with sciatica, unspecified side: Secondary | ICD-10-CM | POA: Diagnosis not present

## 2024-02-05 DIAGNOSIS — F17290 Nicotine dependence, other tobacco product, uncomplicated: Secondary | ICD-10-CM | POA: Diagnosis not present

## 2024-02-05 DIAGNOSIS — G4733 Obstructive sleep apnea (adult) (pediatric): Secondary | ICD-10-CM | POA: Diagnosis not present

## 2024-02-05 DIAGNOSIS — G8929 Other chronic pain: Secondary | ICD-10-CM | POA: Diagnosis not present

## 2024-02-05 DIAGNOSIS — E1121 Type 2 diabetes mellitus with diabetic nephropathy: Secondary | ICD-10-CM | POA: Diagnosis not present

## 2024-02-18 ENCOUNTER — Ambulatory Visit (INDEPENDENT_AMBULATORY_CARE_PROVIDER_SITE_OTHER): Admitting: Family Medicine

## 2024-02-18 ENCOUNTER — Encounter (INDEPENDENT_AMBULATORY_CARE_PROVIDER_SITE_OTHER): Payer: Self-pay | Admitting: Family Medicine

## 2024-02-18 VITALS — BP 114/61 | HR 67 | Temp 98.5°F | Ht 64.0 in | Wt 154.0 lb

## 2024-02-18 DIAGNOSIS — Z7984 Long term (current) use of oral hypoglycemic drugs: Secondary | ICD-10-CM

## 2024-02-18 DIAGNOSIS — E669 Obesity, unspecified: Secondary | ICD-10-CM | POA: Diagnosis not present

## 2024-02-18 DIAGNOSIS — E119 Type 2 diabetes mellitus without complications: Secondary | ICD-10-CM | POA: Diagnosis not present

## 2024-02-18 DIAGNOSIS — Z7985 Long-term (current) use of injectable non-insulin antidiabetic drugs: Secondary | ICD-10-CM

## 2024-02-18 DIAGNOSIS — E1169 Type 2 diabetes mellitus with other specified complication: Secondary | ICD-10-CM

## 2024-02-18 DIAGNOSIS — R0982 Postnasal drip: Secondary | ICD-10-CM

## 2024-02-18 DIAGNOSIS — Z794 Long term (current) use of insulin: Secondary | ICD-10-CM

## 2024-02-18 DIAGNOSIS — E559 Vitamin D deficiency, unspecified: Secondary | ICD-10-CM | POA: Diagnosis not present

## 2024-02-18 DIAGNOSIS — E66811 Obesity, class 1: Secondary | ICD-10-CM

## 2024-02-18 DIAGNOSIS — Z6826 Body mass index (BMI) 26.0-26.9, adult: Secondary | ICD-10-CM | POA: Diagnosis not present

## 2024-02-18 MED ORDER — TIRZEPATIDE 5 MG/0.5ML ~~LOC~~ SOAJ: 5.0000 mg | SUBCUTANEOUS | 0 refills | Status: DC

## 2024-02-18 MED ORDER — CHOLECALCIFEROL 1.25 MG (50000 UT) PO TABS: 50000.0000 [IU] | ORAL_TABLET | ORAL | 0 refills | Status: DC

## 2024-02-18 NOTE — Progress Notes (Signed)
 Office: [phone]  /  Fax: [phone]  WEIGHT SUMMARY AND BIOMETRICS  Anthropometric Measurements Height: 5\' 4"  (1.626 m) Weight: 154 lb (69.9 kg) BMI (Calculated): 26.42 Weight at Last Visit: 156 lb Weight Lost Since Last Visit: 2 lb Weight Gained Since Last Visit: 0 Starting Weight: 203 lb Total Weight Loss (lbs): 49 lb (22.2 kg) Peak Weight: 240 lb   Body Composition  Body Fat %: 32.7 % Fat Mass (lbs): 50.6 lbs Muscle Mass (lbs): 98.8 lbs Total Body Water (lbs): 66.8 lbs Visceral Fat Rating : 9   Other Clinical Data Fasting: no Labs: no Today's Visit #: 35 Starting Date: 06/22/21    Chief Complaint: OBESITY    History of Present Illness Holly Daniels is a 68 year old female with obesity and type two diabetes who presents for obesity treatment plan assessment and progress evaluation.  She follows her category three eating plan fifty percent of the time and has not been exercising. She has lost two pounds in the last two months since her last visit.  She has a history of type two diabetes and is currently on Synjardy 12.01/999 mg. She was previously on Mounjaro  without issues. Due to weight loss, she had to change the location of her Dexcom sensor as there is no longer enough subcutaneous fat in the previous location.  She recently experienced an episode of acute back pain while gardening, necessitating assistance from her husband. She has a history of similar back issues and undergoes nerve ablation procedures approximately every six months for pain management. The procedure involves needles that cauterize the affected nerves, providing temporary relief.  Her husband struggles with walking and uses a scooter for mobility. He recently failed a cognitive test and is scheduled for an MRI to investigate potential brain damage or dementia. She is concerned about his condition and the challenges of caregiving.      PHYSICAL EXAM:  Blood pressure 114/61, pulse 67,  temperature 98.5 F (36.9 C), height 5\' 4"  (1.626 m), weight 154 lb (69.9 kg), SpO2 98%. Body mass index is 26.43 kg/m.  DIAGNOSTIC DATA REVIEWED:  BMET    Component Value Date/Time   NA 143 10/31/2023 1200   K 4.2 10/31/2023 1200   CL 106 10/31/2023 1200   CO2 20 10/31/2023 1200   GLUCOSE 119 (H) 10/31/2023 1200   GLUCOSE 244 (H) 12/05/2015 1135   BUN 14 10/31/2023 1200   CREATININE 0.82 10/31/2023 1200   CALCIUM 9.7 10/31/2023 1200   GFRNONAA >60 12/05/2015 1135   GFRAA >60 12/05/2015 1135   Lab Results  Component Value Date   HGBA1C 6.8 (H) 10/31/2023   HGBA1C 9.1 (H) 12/05/2015   Lab Results  Component Value Date   INSULIN  28.0 (H) 10/31/2023   Lab Results  Component Value Date   TSH 1.440 10/31/2023   CBC    Component Value Date/Time   WBC 8.2 11/29/2022 0000   WBC 10.1 01/04/2012 1355   RBC 4.70 11/29/2022 0000   HGB 14.1 11/29/2022 0000   HGB 16.2 (H) 06/22/2021 1020   HCT 43 11/29/2022 0000   HCT 47.9 (H) 06/22/2021 1020   PLT 186 11/29/2022 0000   PLT 183 06/22/2021 1020   MCV 89 06/22/2021 1020   MCH 29.9 06/22/2021 1020   MCH 30.2 01/04/2012 1355   MCHC 33.8 06/22/2021 1020   MCHC 34.5 01/04/2012 1355   RDW 14.8 06/22/2021 1020   Iron Studies No results found for: "IRON", "TIBC", "FERRITIN", "IRONPCTSAT" Lipid Panel  Component Value Date/Time   CHOL 118 10/31/2023 1200   TRIG 117 10/31/2023 1200   HDL 35 (L) 10/31/2023 1200   CHOLHDL 5.5 12/05/2015 1135   VLDL 35 12/05/2015 1135   LDLCALC 62 10/31/2023 1200   Hepatic Function Panel     Component Value Date/Time   PROT 6.6 10/31/2023 1200   ALBUMIN 4.3 10/31/2023 1200   AST 23 10/31/2023 1200   ALT 43 (H) 10/31/2023 1200   ALKPHOS 116 10/31/2023 1200   BILITOT 0.3 10/31/2023 1200   BILIDIR 0.1 11/29/2022 0000   IBILI NOT CALCULATED 12/05/2015 1135      Component Value Date/Time   TSH 1.440 10/31/2023 1200   Nutritional Lab Results  Component Value Date   VD25OH 34.1  10/31/2023   VD25OH 19 11/29/2022   VD25OH 15.4 (L) 06/22/2021     Assessment and Plan Assessment & Plan Type 2 diabetes mellitus Management is ongoing with Mounjaro  and Synjardy 12.01/999 mg. No issues reported with Mounjaro . Recent labs reviewed; new labs due in July. - Continue Mounjaro  - Continue Synjardy 12.01/999 mg - Order labs in July  Obesity Management is ongoing. She adheres to her category three eating plan 50% of the time and has lost two pounds in the last two months. She plans to increase physical activity by walking. Discussed the importance of regular physical activity and dietary adherence for weight management. - Continue category three eating plan - Increase physical activity, specifically walking  Vitamin D  deficiency Management is ongoing with vitamin D  supplementation. - Prescribe vitamin D  supplementation    She was informed of the importance of frequent follow up visits to maximize her success with intensive lifestyle modifications for her multiple health conditions.    Jasmine Mesi, MD

## 2024-02-19 DIAGNOSIS — E119 Type 2 diabetes mellitus without complications: Secondary | ICD-10-CM | POA: Diagnosis not present

## 2024-03-19 ENCOUNTER — Telehealth (INDEPENDENT_AMBULATORY_CARE_PROVIDER_SITE_OTHER): Payer: Self-pay | Admitting: Family Medicine

## 2024-03-19 NOTE — Telephone Encounter (Signed)
 Holly Daniels with [facility] called stating the pt may have diabetes and is not currently on a statin based off of guidelines. They would like the provider to reevaluate and consider prescribing a statin if appropriate, send a new prescription if so, if the pt does not tolerate it then send over notes and codes and a nurse will reach out. [employer] would like to close this out before the end of the year if possible. You can reach Kimball with [facility] at [phone].

## 2024-03-20 DIAGNOSIS — E1165 Type 2 diabetes mellitus with hyperglycemia: Secondary | ICD-10-CM | POA: Diagnosis not present

## 2024-03-20 DIAGNOSIS — E119 Type 2 diabetes mellitus without complications: Secondary | ICD-10-CM | POA: Diagnosis not present

## 2024-04-16 ENCOUNTER — Ambulatory Visit (INDEPENDENT_AMBULATORY_CARE_PROVIDER_SITE_OTHER): Admitting: Family Medicine

## 2024-04-16 ENCOUNTER — Encounter (INDEPENDENT_AMBULATORY_CARE_PROVIDER_SITE_OTHER): Payer: Self-pay | Admitting: Family Medicine

## 2024-04-16 ENCOUNTER — Other Ambulatory Visit (INDEPENDENT_AMBULATORY_CARE_PROVIDER_SITE_OTHER): Payer: Self-pay | Admitting: Family Medicine

## 2024-04-16 VITALS — BP 126/76 | HR 71 | Temp 98.8°F | Ht 64.0 in | Wt 151.0 lb

## 2024-04-16 DIAGNOSIS — Z7985 Long-term (current) use of injectable non-insulin antidiabetic drugs: Secondary | ICD-10-CM | POA: Diagnosis not present

## 2024-04-16 DIAGNOSIS — Z6826 Body mass index (BMI) 26.0-26.9, adult: Secondary | ICD-10-CM

## 2024-04-16 DIAGNOSIS — E559 Vitamin D deficiency, unspecified: Secondary | ICD-10-CM

## 2024-04-16 DIAGNOSIS — R5383 Other fatigue: Secondary | ICD-10-CM | POA: Diagnosis not present

## 2024-04-16 DIAGNOSIS — J3089 Other allergic rhinitis: Secondary | ICD-10-CM

## 2024-04-16 DIAGNOSIS — E1169 Type 2 diabetes mellitus with other specified complication: Secondary | ICD-10-CM

## 2024-04-16 DIAGNOSIS — E669 Obesity, unspecified: Secondary | ICD-10-CM

## 2024-04-16 DIAGNOSIS — E119 Type 2 diabetes mellitus without complications: Secondary | ICD-10-CM | POA: Diagnosis not present

## 2024-04-16 DIAGNOSIS — Z6825 Body mass index (BMI) 25.0-25.9, adult: Secondary | ICD-10-CM

## 2024-04-16 DIAGNOSIS — E66811 Obesity, class 1: Secondary | ICD-10-CM

## 2024-04-16 MED ORDER — CHOLECALCIFEROL 1.25 MG (50000 UT) PO TABS: 50000.0000 [IU] | ORAL_TABLET | ORAL | 0 refills | Status: DC

## 2024-04-16 MED ORDER — ESCITALOPRAM OXALATE 10 MG PO TABS: 10.0000 mg | ORAL_TABLET | Freq: Every day | ORAL | 0 refills | Status: DC

## 2024-04-16 MED ORDER — TIRZEPATIDE 5 MG/0.5ML ~~LOC~~ SOAJ: 5.0000 mg | SUBCUTANEOUS | 0 refills | Status: DC

## 2024-04-16 MED ORDER — FLUTICASONE PROPIONATE 50 MCG/ACT NA SUSP: 2.0000 | Freq: Every day | NASAL | 0 refills | Status: AC

## 2024-04-16 NOTE — Progress Notes (Signed)
 Office: [phone]  /  Fax: [phone]  WEIGHT SUMMARY AND BIOMETRICS  Anthropometric Measurements Height: 5' 4 (1.626 m) Weight: 151 lb (68.5 kg) BMI (Calculated): 25.91 Weight at Last Visit: 154 lb Weight Lost Since Last Visit: 3 lb Weight Gained Since Last Visit: 0 lb Starting Weight: 203 lb Total Weight Loss (lbs): 52 lb (23.6 kg) Peak Weight: 240 lb   Body Composition  Body Fat %: 32 % Fat Mass (lbs): 48.4 lbs Muscle Mass (lbs): 97.8 lbs Total Body Water (lbs): 66.8 lbs Visceral Fat Rating : 8   Other Clinical Data Fasting: no Labs: no Today's Visit #: 36 Starting Date: 06/22/21    Chief Complaint: OBESITY   Discussed the use of AI scribe software for clinical note transcription with the patient, who gave verbal consent to proceed.  History of Present Illness Holly Daniels is a 68 year old female who presents to discuss her obesity treatment plan.  She has been adhering to the category three eating plan about fifty percent of the time and has incorporated walking for exercise, approximately ten to fifteen minutes, three times a week. She has lost three pounds in the last two months.  She experiences sinus issues, including postnasal drip, itchiness, and a mild sore throat. She feels queasy after eating, which she associates with swallowing mucus. She is currently taking loratadine  and Singulair , but finds them ineffective for her symptoms.  Her current medications include loratadine  and Singulair  for sinus issues. She also takes vitamin D , with her last level checked in January being 34, and the goal being around 50. She is due for a lab check for her vitamin D  levels.  She is the primary caregiver for her husband and reports feeling exhausted from her caregiving responsibilities.      PHYSICAL EXAM:  Blood pressure 126/76, pulse 71, temperature 98.8 F (37.1 C), height 5' 4 (1.626 m), weight 151 lb (68.5 kg), SpO2 100%. Body mass index is 25.92  kg/m.  DIAGNOSTIC DATA REVIEWED:  BMET    Component Value Date/Time   NA 143 10/31/2023 1200   K 4.2 10/31/2023 1200   CL 106 10/31/2023 1200   CO2 20 10/31/2023 1200   GLUCOSE 119 (H) 10/31/2023 1200   GLUCOSE 244 (H) 12/05/2015 1135   BUN 14 10/31/2023 1200   CREATININE 0.82 10/31/2023 1200   CALCIUM 9.7 10/31/2023 1200   GFRNONAA >60 12/05/2015 1135   GFRAA >60 12/05/2015 1135   Lab Results  Component Value Date   HGBA1C 6.8 (H) 10/31/2023   HGBA1C 9.1 (H) 12/05/2015   Lab Results  Component Value Date   INSULIN  28.0 (H) 10/31/2023   Lab Results  Component Value Date   TSH 1.440 10/31/2023   CBC    Component Value Date/Time   WBC 8.2 11/29/2022 0000   WBC 10.1 01/04/2012 1355   RBC 4.70 11/29/2022 0000   HGB 14.1 11/29/2022 0000   HGB 16.2 (H) 06/22/2021 1020   HCT 43 11/29/2022 0000   HCT 47.9 (H) 06/22/2021 1020   PLT 186 11/29/2022 0000   PLT 183 06/22/2021 1020   MCV 89 06/22/2021 1020   MCH 29.9 06/22/2021 1020   MCH 30.2 01/04/2012 1355   MCHC 33.8 06/22/2021 1020   MCHC 34.5 01/04/2012 1355   RDW 14.8 06/22/2021 1020   Iron Studies No results found for: IRON, TIBC, FERRITIN, IRONPCTSAT Lipid Panel     Component Value Date/Time   CHOL 118 10/31/2023 1200   TRIG 117 10/31/2023 1200  HDL 35 (L) 10/31/2023 1200   CHOLHDL 5.5 12/05/2015 1135   VLDL 35 12/05/2015 1135   LDLCALC 62 10/31/2023 1200   Hepatic Function Panel     Component Value Date/Time   PROT 6.6 10/31/2023 1200   ALBUMIN 4.3 10/31/2023 1200   AST 23 10/31/2023 1200   ALT 43 (H) 10/31/2023 1200   ALKPHOS 116 10/31/2023 1200   BILITOT 0.3 10/31/2023 1200   BILIDIR 0.1 11/29/2022 0000   IBILI NOT CALCULATED 12/05/2015 1135      Component Value Date/Time   TSH 1.440 10/31/2023 1200   Nutritional Lab Results  Component Value Date   VD25OH 34.1 10/31/2023   VD25OH 19 11/29/2022   VD25OH 15.4 (L) 06/22/2021     Assessment and Plan Assessment &  Plan Allergic Rhinitis Sinus issues with postnasal drip, queasiness, and mild sore throat persist. Current medications loratadine  and Singulair  are insufficient. - Prescribe Flonase  and instruct on proper use targeting nasal turbinates to reduce inflammation.  Obesity and DM II Following category three eating plan 50% of the time and walking 10-15 minutes three times a week. Lost three pounds in the last two months. Goal is to maintain weight loss and prevent malnutrition. - Continue category three eating plan. - Encourage walking for exercise 10-15 minutes three times a week. - Refill Mounjaro .  Vitamin D  Deficiency Last vitamin D  level was 34 in January; goal is around 50. Weight loss may have increased levels. Plan to check levels and adjust supplementation. If levels are close to 100, stop supplementation; if around 80, reduce frequency to every other week. - Order vitamin D  level. - Refill vitamin D  supplementation.  Caregiver Fatigue Experiencing high stress and caregiver fatigue due to husband's dementia. Discussed stress impact on health, including cortisol effects. Consider escitalopram  for stress and anxiety management. Explained escitalopram  is non-addictive, may cause dry mouth and reduced libido, and can improve emotional reserve and sleep quality. - Prescribe escitalopram . - Discuss potential side effects of escitalopram , including dry mouth and reduced libido. - Encourage discussion with husband's doctor about increasing medication for his irritability.  Follow-up Appointments scheduled for August and September. - Follow up in four weeks to assess progress and adjust treatment plans.      She was informed of the importance of frequent follow up visits to maximize her success with intensive lifestyle modifications for her multiple health conditions.    Louann Penton, MD

## 2024-04-16 NOTE — Telephone Encounter (Signed)
 Spoke with patient at her appt today. She is still taking her Crestor.

## 2024-05-14 ENCOUNTER — Ambulatory Visit (INDEPENDENT_AMBULATORY_CARE_PROVIDER_SITE_OTHER): Admitting: Family Medicine

## 2024-05-14 ENCOUNTER — Encounter (INDEPENDENT_AMBULATORY_CARE_PROVIDER_SITE_OTHER): Payer: Self-pay | Admitting: Family Medicine

## 2024-05-14 VITALS — BP 129/73 | HR 72 | Temp 98.3°F | Ht 64.0 in | Wt 152.0 lb

## 2024-05-14 DIAGNOSIS — E1121 Type 2 diabetes mellitus with diabetic nephropathy: Secondary | ICD-10-CM | POA: Diagnosis not present

## 2024-05-14 DIAGNOSIS — Z794 Long term (current) use of insulin: Secondary | ICD-10-CM

## 2024-05-14 DIAGNOSIS — R5383 Other fatigue: Secondary | ICD-10-CM

## 2024-05-14 DIAGNOSIS — Z6826 Body mass index (BMI) 26.0-26.9, adult: Secondary | ICD-10-CM

## 2024-05-14 DIAGNOSIS — E669 Obesity, unspecified: Secondary | ICD-10-CM

## 2024-05-14 DIAGNOSIS — E1165 Type 2 diabetes mellitus with hyperglycemia: Secondary | ICD-10-CM | POA: Diagnosis not present

## 2024-05-14 DIAGNOSIS — E559 Vitamin D deficiency, unspecified: Secondary | ICD-10-CM | POA: Diagnosis not present

## 2024-05-14 DIAGNOSIS — E1122 Type 2 diabetes mellitus with diabetic chronic kidney disease: Secondary | ICD-10-CM | POA: Diagnosis not present

## 2024-05-14 DIAGNOSIS — I10 Essential (primary) hypertension: Secondary | ICD-10-CM | POA: Diagnosis not present

## 2024-05-14 DIAGNOSIS — E782 Mixed hyperlipidemia: Secondary | ICD-10-CM | POA: Diagnosis not present

## 2024-05-14 DIAGNOSIS — E1169 Type 2 diabetes mellitus with other specified complication: Secondary | ICD-10-CM

## 2024-05-14 MED ORDER — TELMISARTAN 20 MG PO TABS: 20.0000 mg | ORAL_TABLET | Freq: Every day | ORAL | 0 refills | Status: DC

## 2024-05-14 MED ORDER — ESCITALOPRAM OXALATE 10 MG PO TABS: 10.0000 mg | ORAL_TABLET | Freq: Every day | ORAL | 0 refills | Status: DC

## 2024-05-14 MED ORDER — TIRZEPATIDE 5 MG/0.5ML ~~LOC~~ SOAJ
5.0000 mg | SUBCUTANEOUS | 0 refills | Status: DC
Start: 2024-05-14 — End: 2024-06-11

## 2024-05-14 MED ORDER — CHOLECALCIFEROL 1.25 MG (50000 UT) PO TABS: 50000.0000 [IU] | ORAL_TABLET | ORAL | 0 refills | Status: DC

## 2024-05-14 NOTE — Progress Notes (Signed)
 Office: [phone]  /  Fax: [phone]  WEIGHT SUMMARY AND BIOMETRICS  Anthropometric Measurements Height: 5' 4 (1.626 m) Weight: 152 lb (68.9 kg) BMI (Calculated): 26.08 Weight at Last Visit: 151 lb Weight Lost Since Last Visit: 0 lb Weight Gained Since Last Visit: 1 lb Starting Weight: 203 lb Total Weight Loss (lbs): 51 lb (23.1 kg) Peak Weight: 240 lb   Body Composition  Body Fat %: 33.7 % Fat Mass (lbs): 51.4 lbs Muscle Mass (lbs): 96 lbs Total Body Water (lbs): 67.6 lbs Visceral Fat Rating : 9   Other Clinical Data Fasting: no Labs: no Today's Visit #: 37 Starting Date: 06/22/21    Chief Complaint: OBESITY    History of Present Illness Holly Daniels is a 68 year old female who presents for obesity treatment and progress assessment.  She has been following a category three eating plan about fifty percent of the time and exercises two and a half days a week, primarily through walking. Despite these efforts, she has gained one pound in the last month. Her hunger is well-controlled, and she is not experiencing cravings, except for bologna, which she has not purchased due to its cost. She has increased her water intake and experiences frequent voiding.  She is currently out of her blood pressure medication and requires a refill. She does not have a refill appointment with her primary care provider until the 27th of the month.  She is awaiting lab results for her vitamin D levels, which were drawn today. She is not currently taking vitamin D supplements as she has run out.  She is planning her daughter's fiftieth birthday gala, which has been a significant focus for her, involving extensive planning and financial commitment. Her husband requires care, which impacts her ability to go to the gym.      PHYSICAL EXAM:  Blood pressure 129/73, pulse 72, temperature 98.3 F (36.8 C), height 5' 4 (1.626 m), weight 152 lb (68.9 kg), SpO2 100%. Body mass index is  26.09 kg/m.  DIAGNOSTIC DATA REVIEWED:  BMET    Component Value Date/Time   NA 143 10/31/2023 1200   K 4.2 10/31/2023 1200   CL 106 10/31/2023 1200   CO2 20 10/31/2023 1200   GLUCOSE 119 (H) 10/31/2023 1200   GLUCOSE 244 (H) 12/05/2015 1135   BUN 14 10/31/2023 1200   CREATININE 0.82 10/31/2023 1200   CALCIUM 9.7 10/31/2023 1200   GFRNONAA >60 12/05/2015 1135   GFRAA >60 12/05/2015 1135   Lab Results  Component Value Date   HGBA1C 6.8 (H) 10/31/2023   HGBA1C 9.1 (H) 12/05/2015   Lab Results  Component Value Date   INSULIN 28.0 (H) 10/31/2023   Lab Results  Component Value Date   TSH 1.440 10/31/2023   CBC    Component Value Date/Time   WBC 8.2 11/29/2022 0000   WBC 10.1 01/04/2012 1355   RBC 4.70 11/29/2022 0000   HGB 14.1 11/29/2022 0000   HGB 16.2 (H) 06/22/2021 1020   HCT 43 11/29/2022 0000   HCT 47.9 (H) 06/22/2021 1020   PLT 186 11/29/2022 0000   PLT 183 06/22/2021 1020   MCV 89 06/22/2021 1020   MCH 29.9 06/22/2021 1020   MCH 30.2 01/04/2012 1355   MCHC 33.8 06/22/2021 1020   MCHC 34.5 01/04/2012 1355   RDW 14.8 06/22/2021 1020   Iron Studies No results found for: IRON, TIBC, FERRITIN, IRONPCTSAT Lipid Panel     Component Value Date/Time   CHOL 118 10/31/2023  1200   TRIG 117 10/31/2023 1200   HDL 35 (L) 10/31/2023 1200   CHOLHDL 5.5 12/05/2015 1135   VLDL 35 12/05/2015 1135   LDLCALC 62 10/31/2023 1200   Hepatic Function Panel     Component Value Date/Time   PROT 6.6 10/31/2023 1200   ALBUMIN 4.3 10/31/2023 1200   AST 23 10/31/2023 1200   ALT 43 (H) 10/31/2023 1200   ALKPHOS 116 10/31/2023 1200   BILITOT 0.3 10/31/2023 1200   BILIDIR 0.1 11/29/2022 0000   IBILI NOT CALCULATED 12/05/2015 1135      Component Value Date/Time   TSH 1.440 10/31/2023 1200   Nutritional Lab Results  Component Value Date   VD25OH 34.1 10/31/2023   VD25OH 19 11/29/2022   VD25OH 15.4 (L) 06/22/2021     Assessment and Plan Assessment &  Plan Obesity Gained one pound since the last visit, likely due to fluid retention rather than fat gain. Following the category three eating plan about 50% of the time and exercises by walking two and a half days a week. Muscle mass is decreasing, likely due to insufficient muscle challenge as weight is lost. - Increase muscle challenge by incorporating uphill walking, steps, or using ankle weights starting with one pound and gradually increasing to two pounds. - Continue following the category three eating plan and aim to increase adherence.  Essential hypertension Blood pressure is currently 129/73 mmHg. Managing blood pressure through diet, exercise, and medication. Requires a refill of blood pressure medication as she is out and does not have a refill available before her next primary care appointment. - Refill blood pressure medication to prevent uncontrolled hypertension. - Continue diet, exercise and weight loss as discussed today as an important part of the treatment plan   Vitamin D deficiency (pending lab confirmation) Vitamin D levels are pending lab results. Previous labs were not received, and she is currently not taking vitamin D supplements due to lack of supply. The target vitamin D level is between 50-60 ng/mL. If levels are below this range, supplementation will continue; if levels are above 80 ng/mL, supplementation frequency will be adjusted. - Refill vitamin D supplement prescription. - Review lab results once available to determine appropriate vitamin D supplementation regimen.    She was informed of the importance of frequent follow up visits to maximize her success with intensive lifestyle modifications for her multiple health conditions.    Louann Penton, MD

## 2024-05-19 DIAGNOSIS — E119 Type 2 diabetes mellitus without complications: Secondary | ICD-10-CM | POA: Diagnosis not present

## 2024-05-22 DIAGNOSIS — M5440 Lumbago with sciatica, unspecified side: Secondary | ICD-10-CM | POA: Diagnosis not present

## 2024-05-22 DIAGNOSIS — E782 Mixed hyperlipidemia: Secondary | ICD-10-CM | POA: Diagnosis not present

## 2024-05-22 DIAGNOSIS — F17290 Nicotine dependence, other tobacco product, uncomplicated: Secondary | ICD-10-CM | POA: Diagnosis not present

## 2024-05-22 DIAGNOSIS — E1165 Type 2 diabetes mellitus with hyperglycemia: Secondary | ICD-10-CM | POA: Diagnosis not present

## 2024-05-22 DIAGNOSIS — G8929 Other chronic pain: Secondary | ICD-10-CM | POA: Diagnosis not present

## 2024-05-22 DIAGNOSIS — G4733 Obstructive sleep apnea (adult) (pediatric): Secondary | ICD-10-CM | POA: Diagnosis not present

## 2024-05-22 DIAGNOSIS — E1122 Type 2 diabetes mellitus with diabetic chronic kidney disease: Secondary | ICD-10-CM | POA: Diagnosis not present

## 2024-05-22 DIAGNOSIS — E1142 Type 2 diabetes mellitus with diabetic polyneuropathy: Secondary | ICD-10-CM | POA: Diagnosis not present

## 2024-05-22 DIAGNOSIS — I10 Essential (primary) hypertension: Secondary | ICD-10-CM | POA: Diagnosis not present

## 2024-05-22 DIAGNOSIS — E1121 Type 2 diabetes mellitus with diabetic nephropathy: Secondary | ICD-10-CM | POA: Diagnosis not present

## 2024-06-09 DIAGNOSIS — J208 Acute bronchitis due to other specified organisms: Secondary | ICD-10-CM | POA: Diagnosis not present

## 2024-06-10 DIAGNOSIS — J208 Acute bronchitis due to other specified organisms: Secondary | ICD-10-CM | POA: Diagnosis not present

## 2024-06-11 ENCOUNTER — Encounter (INDEPENDENT_AMBULATORY_CARE_PROVIDER_SITE_OTHER): Payer: Self-pay | Admitting: Family Medicine

## 2024-06-11 ENCOUNTER — Ambulatory Visit (INDEPENDENT_AMBULATORY_CARE_PROVIDER_SITE_OTHER): Admitting: Family Medicine

## 2024-06-11 VITALS — BP 108/67 | HR 80 | Temp 98.2°F | Ht 64.0 in | Wt 149.0 lb

## 2024-06-11 DIAGNOSIS — Z6825 Body mass index (BMI) 25.0-25.9, adult: Secondary | ICD-10-CM

## 2024-06-11 DIAGNOSIS — Z6834 Body mass index (BMI) 34.0-34.9, adult: Secondary | ICD-10-CM

## 2024-06-11 DIAGNOSIS — E119 Type 2 diabetes mellitus without complications: Secondary | ICD-10-CM

## 2024-06-11 DIAGNOSIS — I10 Essential (primary) hypertension: Secondary | ICD-10-CM

## 2024-06-11 DIAGNOSIS — R0982 Postnasal drip: Secondary | ICD-10-CM

## 2024-06-11 DIAGNOSIS — Z7985 Long-term (current) use of injectable non-insulin antidiabetic drugs: Secondary | ICD-10-CM | POA: Diagnosis not present

## 2024-06-11 DIAGNOSIS — R5383 Other fatigue: Secondary | ICD-10-CM

## 2024-06-11 DIAGNOSIS — Z8639 Personal history of other endocrine, nutritional and metabolic disease: Secondary | ICD-10-CM | POA: Diagnosis not present

## 2024-06-11 DIAGNOSIS — E559 Vitamin D deficiency, unspecified: Secondary | ICD-10-CM

## 2024-06-11 DIAGNOSIS — J3089 Other allergic rhinitis: Secondary | ICD-10-CM

## 2024-06-11 DIAGNOSIS — E1169 Type 2 diabetes mellitus with other specified complication: Secondary | ICD-10-CM

## 2024-06-11 MED ORDER — TIRZEPATIDE 5 MG/0.5ML ~~LOC~~ SOAJ: 5.0000 mg | SUBCUTANEOUS | 0 refills | Status: DC

## 2024-06-11 MED ORDER — ESCITALOPRAM OXALATE 10 MG PO TABS: 10.0000 mg | ORAL_TABLET | Freq: Every day | ORAL | 0 refills | Status: DC

## 2024-06-11 MED ORDER — CHOLECALCIFEROL 1.25 MG (50000 UT) PO TABS: 50000.0000 [IU] | ORAL_TABLET | ORAL | 0 refills | Status: DC

## 2024-06-11 MED ORDER — TELMISARTAN 20 MG PO TABS: 20.0000 mg | ORAL_TABLET | Freq: Every day | ORAL | 0 refills | Status: DC

## 2024-06-11 NOTE — Progress Notes (Signed)
 Office: [phone]  /  Fax: [phone]  WEIGHT SUMMARY AND BIOMETRICS  Anthropometric Measurements Height: 5' 4 (1.626 m) Weight: 149 lb (67.6 kg) BMI (Calculated): 25.56 Weight at Last Visit: 152 lb Weight Lost Since Last Visit: 3 lb Weight Gained Since Last Visit: 0 Starting Weight: 203 lb Total Weight Loss (lbs): 54 lb (24.5 kg) Peak Weight: 240 lb   Body Composition  Body Fat %: 34.4 % Fat Mass (lbs): 51.4 lbs Muscle Mass (lbs): 93 lbs Total Body Water (lbs): 65 lbs Visceral Fat Rating : 9   Other Clinical Data Fasting: no Labs: no Today's Visit #: 38 Starting Date: 06/22/21    Chief Complaint: OBESITY  History of Present Illness Holly Daniels is a 68 year old female with obesity who presents for obesity treatment.  She has been following the category three eating plan about fifty percent of the time. Her physical activity includes strengthening exercises such as leg lifts and ankle weights, performed for five to seven minutes on two days each week. Over the past month, she has achieved a weight loss of three pounds since her last visit.  She recently experienced a sinus infection and was prescribed azithromycin (Z-Pak) four days ago. She has a history of COVID, RSV, and the flu, and currently has a lingering cough. For the cough, she takes Daypro and Opal.  No issues with blood sugar are reported, including no episodes of low blood sugars, or symptoms such as feeling hot, weak, or shaky.  She is actively preparing for her daughter's fiftieth birthday party, which will feature heavy hors d'oeuvres, salad, iced tea, lemonade, a small cake, and cupcakes.      PHYSICAL EXAM:  Blood pressure 108/67, pulse 80, temperature 98.2 F (36.8 C), height 5' 4 (1.626 m), weight 149 lb (67.6 kg), SpO2 98%. Body mass index is 25.58 kg/m.  DIAGNOSTIC DATA REVIEWED:  BMET    Component Value Date/Time   NA 143 10/31/2023 1200   K 4.2 10/31/2023 1200   CL 106  10/31/2023 1200   CO2 20 10/31/2023 1200   GLUCOSE 119 (H) 10/31/2023 1200   GLUCOSE 244 (H) 12/05/2015 1135   BUN 14 10/31/2023 1200   CREATININE 0.82 10/31/2023 1200   CALCIUM 9.7 10/31/2023 1200   GFRNONAA >60 12/05/2015 1135   GFRAA >60 12/05/2015 1135   Lab Results  Component Value Date   HGBA1C 6.8 (H) 10/31/2023   HGBA1C 9.1 (H) 12/05/2015   Lab Results  Component Value Date   INSULIN  28.0 (H) 10/31/2023   Lab Results  Component Value Date   TSH 1.440 10/31/2023   CBC    Component Value Date/Time   WBC 8.2 11/29/2022 0000   WBC 10.1 01/04/2012 1355   RBC 4.70 11/29/2022 0000   HGB 14.1 11/29/2022 0000   HGB 16.2 (H) 06/22/2021 1020   HCT 43 11/29/2022 0000   HCT 47.9 (H) 06/22/2021 1020   PLT 186 11/29/2022 0000   PLT 183 06/22/2021 1020   MCV 89 06/22/2021 1020   MCH 29.9 06/22/2021 1020   MCH 30.2 01/04/2012 1355   MCHC 33.8 06/22/2021 1020   MCHC 34.5 01/04/2012 1355   RDW 14.8 06/22/2021 1020   Iron Studies No results found for: IRON, TIBC, FERRITIN, IRONPCTSAT Lipid Panel     Component Value Date/Time   CHOL 118 10/31/2023 1200   TRIG 117 10/31/2023 1200   HDL 35 (L) 10/31/2023 1200   CHOLHDL 5.5 12/05/2015 1135   VLDL 35 12/05/2015 1135  LDLCALC 62 10/31/2023 1200   Hepatic Function Panel     Component Value Date/Time   PROT 6.6 10/31/2023 1200   ALBUMIN 4.3 10/31/2023 1200   AST 23 10/31/2023 1200   ALT 43 (H) 10/31/2023 1200   ALKPHOS 116 10/31/2023 1200   BILITOT 0.3 10/31/2023 1200   BILIDIR 0.1 11/29/2022 0000   IBILI NOT CALCULATED 12/05/2015 1135      Component Value Date/Time   TSH 1.440 10/31/2023 1200   Nutritional Lab Results  Component Value Date   VD25OH 34.1 10/31/2023   VD25OH 19 11/29/2022   VD25OH 15.4 (L) 06/22/2021     Assessment and Plan Assessment & Plan Obesity in remission Obesity is in remission with a BMI of 25.6. She has been following the category three eating plan about 50% of the time  and has lost three pounds in the last month. She engages in strengthening exercises with leg lifts and ankle weights for 5-7 minutes, two days a week. - Continue category three eating plan - Increase strengthening exercises as tolerated  Type 2 diabetes mellitus without complications Type 2 diabetes is well-managed. She reports no issues with blood glucose levels and denies symptoms of hypoglycemia such as feeling hot, weak, or shaky. -RF Mounjaro  -- Continue diet, exercise and weight loss as discussed today as an important part of the treatment plan   Essential hypertension, controlled Hypertension is controlled with a blood pressure reading of 18/67. She is working on diet, exercise, and weight loss to maintain control. - Continue diet, exercise and weight loss as discussed today as an important part of the treatment plan   Vitamin D  deficiency Vitamin D  deficiency stable, refill needed -RF Vit D and follow    Shaylie was informed of the importance of frequent follow up visits to maximize her success with intensive lifestyle modifications for her obesity and obesity related health conditions as recommended by USPSTF and CMS guidelines   Louann Penton, MD

## 2024-06-29 DIAGNOSIS — J0110 Acute frontal sinusitis, unspecified: Secondary | ICD-10-CM | POA: Diagnosis not present

## 2024-06-29 DIAGNOSIS — H5712 Ocular pain, left eye: Secondary | ICD-10-CM | POA: Diagnosis not present

## 2024-07-02 DIAGNOSIS — H00025 Hordeolum internum left lower eyelid: Secondary | ICD-10-CM | POA: Diagnosis not present

## 2024-07-02 DIAGNOSIS — H05112 Granuloma of left orbit: Secondary | ICD-10-CM | POA: Diagnosis not present

## 2024-07-06 DIAGNOSIS — H05112 Granuloma of left orbit: Secondary | ICD-10-CM | POA: Diagnosis not present

## 2024-07-06 DIAGNOSIS — H00025 Hordeolum internum left lower eyelid: Secondary | ICD-10-CM | POA: Diagnosis not present

## 2024-07-13 ENCOUNTER — Encounter (INDEPENDENT_AMBULATORY_CARE_PROVIDER_SITE_OTHER): Payer: Self-pay | Admitting: Family Medicine

## 2024-07-13 ENCOUNTER — Ambulatory Visit (INDEPENDENT_AMBULATORY_CARE_PROVIDER_SITE_OTHER): Admitting: Family Medicine

## 2024-07-13 VITALS — BP 105/65 | HR 75 | Temp 98.8°F | Ht 64.0 in | Wt 148.0 lb

## 2024-07-13 DIAGNOSIS — E785 Hyperlipidemia, unspecified: Secondary | ICD-10-CM

## 2024-07-13 DIAGNOSIS — E559 Vitamin D deficiency, unspecified: Secondary | ICD-10-CM | POA: Diagnosis not present

## 2024-07-13 DIAGNOSIS — H0510 Unspecified chronic inflammatory disorders of orbit: Secondary | ICD-10-CM | POA: Insufficient documentation

## 2024-07-13 DIAGNOSIS — Z7985 Long-term (current) use of injectable non-insulin antidiabetic drugs: Secondary | ICD-10-CM | POA: Diagnosis not present

## 2024-07-13 DIAGNOSIS — E538 Deficiency of other specified B group vitamins: Secondary | ICD-10-CM | POA: Diagnosis not present

## 2024-07-13 DIAGNOSIS — E669 Obesity, unspecified: Secondary | ICD-10-CM | POA: Diagnosis not present

## 2024-07-13 DIAGNOSIS — E1169 Type 2 diabetes mellitus with other specified complication: Secondary | ICD-10-CM

## 2024-07-13 DIAGNOSIS — E119 Type 2 diabetes mellitus without complications: Secondary | ICD-10-CM

## 2024-07-13 DIAGNOSIS — Z7984 Long term (current) use of oral hypoglycemic drugs: Secondary | ICD-10-CM | POA: Diagnosis not present

## 2024-07-13 DIAGNOSIS — E7849 Other hyperlipidemia: Secondary | ICD-10-CM

## 2024-07-13 DIAGNOSIS — Z6825 Body mass index (BMI) 25.0-25.9, adult: Secondary | ICD-10-CM

## 2024-07-13 NOTE — Progress Notes (Signed)
 Office: [phone]  /  Fax: [phone]  WEIGHT SUMMARY AND BIOMETRICS  Anthropometric Measurements Height: 5' 4 (1.626 m) Weight: 148 lb (67.1 kg) BMI (Calculated): 25.39 Weight at Last Visit: 149lb Weight Lost Since Last Visit: 1lb Weight Gained Since Last Visit: 0lb Starting Weight: 203lb Total Weight Loss (lbs): 55 lb (24.9 kg) Peak Weight: 240lb   Body Composition  Body Fat %: 33.3 % Fat Mass (lbs): 49.4 lbs Muscle Mass (lbs): 94.2 lbs Total Body Water (lbs): 64.8 lbs Visceral Fat Rating : 9   Other Clinical Data Fasting: No Labs: no Today's Visit #: 39 Starting Date: 06/22/21    Chief Complaint: OBESITY    History of Present Illness Holly Daniels is a 68 year old female who presents for a follow-up on her obesity treatment plan.  She is adhering to the category three obesity treatment plan about forty percent of the time. She is not exercising regularly but is making dietary changes by incorporating more vegetables and meeting her protein goals. She has lost one pound in the last month since her previous visit.  She recently celebrated her birthday, during which she indulged in cake but made healthier choices such as drinking water, avoiding eating in bed, controlling portions, and stopping when full. She has been engaging in dancing as a form of physical activity.  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 Treatment included eye drops, ointment, and ibuprofen  for inflammation, as she cannot take steroids. She was told by her provider that there was some sinusitis with the eye pain, and she was diagnosed with conjunctivitis. She reports some residual pain and a film across the eye but notes improvement. She has an upcoming follow-up appointment.  Her current medications include Mounjaro  and Synjardy, which contains metformin. She is unsure about her vitamin D  supplementation  pending blood test results. She has experienced gastrointestinal issues with metformin alone, leading to diarrhea.  Her sleep quality is good, though she stays up late watching TV by choice. She wakes up naturally unless she has an appointment. She feels great overall, better than she has in years, aside from the recent eye issue.      PHYSICAL EXAM:  Blood pressure 105/65, pulse 75, temperature 98.8 F (37.1 C), height 5' 4 (1.626 m), weight 148 lb (67.1 kg), SpO2 100%. Body mass index is 25.4 kg/m.  DIAGNOSTIC DATA REVIEWED:  BMET    Component Value Date/Time   NA 143 10/31/2023 1200   K 4.2 10/31/2023 1200   CL 106 10/31/2023 1200   CO2 20 10/31/2023 1200   GLUCOSE 119 (H) 10/31/2023 1200   GLUCOSE 244 (H) 12/05/2015 1135   BUN 14 10/31/2023 1200   CREATININE 0.82 10/31/2023 1200   CALCIUM 9.7 10/31/2023 1200   GFRNONAA >60 12/05/2015 1135   GFRAA >60 12/05/2015 1135   Lab Results  Component Value Date   HGBA1C 6.8 (H) 10/31/2023   HGBA1C 9.1 (H) 12/05/2015   Lab Results  Component Value Date   INSULIN  28.0 (H) 10/31/2023   Lab Results  Component Value Date   TSH 1.440 10/31/2023   CBC    Component Value Date/Time   WBC 8.2 11/29/2022 0000   WBC 10.1 01/04/2012 1355   RBC 4.70 11/29/2022 0000   HGB 14.1 11/29/2022 0000   HGB 16.2 (H) 06/22/2021 1020   HCT 43 11/29/2022 0000   HCT 47.9 (H) 06/22/2021 1020   PLT 186 11/29/2022 0000  PLT 183 06/22/2021 1020   MCV 89 06/22/2021 1020   MCH 29.9 06/22/2021 1020   MCH 30.2 01/04/2012 1355   MCHC 33.8 06/22/2021 1020   MCHC 34.5 01/04/2012 1355   RDW 14.8 06/22/2021 1020   Iron Studies No results found for: IRON, TIBC, FERRITIN, IRONPCTSAT Lipid Panel     Component Value Date/Time   CHOL 118 10/31/2023 1200   TRIG 117 10/31/2023 1200   HDL 35 (L) 10/31/2023 1200   CHOLHDL 5.5 12/05/2015 1135   VLDL 35 12/05/2015 1135   LDLCALC 62 10/31/2023 1200   Hepatic Function Panel     Component  Value Date/Time   PROT 6.6 10/31/2023 1200   ALBUMIN 4.3 10/31/2023 1200   AST 23 10/31/2023 1200   ALT 43 (H) 10/31/2023 1200   ALKPHOS 116 10/31/2023 1200   BILITOT 0.3 10/31/2023 1200   BILIDIR 0.1 11/29/2022 0000   IBILI NOT CALCULATED 12/05/2015 1135      Component Value Date/Time   TSH 1.440 10/31/2023 1200   Nutritional Lab Results  Component Value Date   VD25OH 34.1 10/31/2023   VD25OH 19 11/29/2022   VD25OH 15.4 (L) 06/22/2021     Assessment and Plan Assessment & Plan Obesity Obesity management with category three plan, adherence at 40%. Weight loss of 1 pound in the last month. No current exercise routine, but efforts to increase vegetable intake and meet protein goals. Engaged in dancing as a form of physical activity. 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 Symptoms included severe eye pain, redness, and conjunctivitis. Treated with eye drops, ointment, and ibuprofen . Follow-up with ophthalmologist scheduled for tomorrow. Unknown etiology, possibly idiopathic. She has taken measures to prevent recurrence by replacing eye makeup and brushes. - Continue current eye drops and ointment until follow-up - Pt to inquire about risk of recurrence at follow-up at her app[ointment tomorrow  Type 2 diabetes mellitus Type 2 diabetes managed with Mounjaro , Synjardy (metformin and empagliflozin). Previous issues with metformin causing diarrhea. Monitoring glucose and A1c levels. - Order fasting labs including A1c, glucose, and insulin  - Continue current diabetes medications  Vitamin D  deficiency, monitoring Vitamin D  levels previously monitored. Awaiting new blood test  results to assess current levels. - Order vitamin D  level as part of fasting labs  Vitamin B12 deficiency, monitoring Previous B12 levels lower than desired, possibly due to metformin affecting absorption. She is on a B12-rich diet. Monitoring to determine if supplementation is needed. - Order B12 level as part of fasting labs - Consider B12 supplementation if levels remain low  Hyperlipidemia, monitoring Hyperlipidemia previously well-controlled. Monitoring to ensure continued management. - Order lipid panel as part of fasting labs   Donnah was informed of the importance of frequent follow up visits to maximize her success with intensive lifestyle modifications for her obesity and obesity related health conditions as recommended by USPSTF and CMS guidelines   Louann Penton, MD

## 2024-07-14 ENCOUNTER — Other Ambulatory Visit (INDEPENDENT_AMBULATORY_CARE_PROVIDER_SITE_OTHER): Payer: Self-pay | Admitting: Family Medicine

## 2024-07-14 DIAGNOSIS — R5383 Other fatigue: Secondary | ICD-10-CM

## 2024-07-14 DIAGNOSIS — H04123 Dry eye syndrome of bilateral lacrimal glands: Secondary | ICD-10-CM | POA: Diagnosis not present

## 2024-07-14 DIAGNOSIS — H1045 Other chronic allergic conjunctivitis: Secondary | ICD-10-CM | POA: Diagnosis not present

## 2024-07-14 DIAGNOSIS — H05112 Granuloma of left orbit: Secondary | ICD-10-CM | POA: Diagnosis not present

## 2024-08-10 ENCOUNTER — Ambulatory Visit (INDEPENDENT_AMBULATORY_CARE_PROVIDER_SITE_OTHER): Payer: Self-pay | Admitting: Family Medicine

## 2024-08-10 ENCOUNTER — Other Ambulatory Visit (INDEPENDENT_AMBULATORY_CARE_PROVIDER_SITE_OTHER): Payer: Self-pay | Admitting: Family Medicine

## 2024-08-10 DIAGNOSIS — E1169 Type 2 diabetes mellitus with other specified complication: Secondary | ICD-10-CM

## 2024-09-07 ENCOUNTER — Ambulatory Visit (INDEPENDENT_AMBULATORY_CARE_PROVIDER_SITE_OTHER): Payer: Self-pay | Admitting: Family Medicine

## 2024-09-14 ENCOUNTER — Encounter (INDEPENDENT_AMBULATORY_CARE_PROVIDER_SITE_OTHER): Admitting: Ophthalmology

## 2024-09-16 ENCOUNTER — Other Ambulatory Visit (INDEPENDENT_AMBULATORY_CARE_PROVIDER_SITE_OTHER): Payer: Self-pay | Admitting: Family Medicine

## 2024-09-16 DIAGNOSIS — E1169 Type 2 diabetes mellitus with other specified complication: Secondary | ICD-10-CM

## 2024-10-20 ENCOUNTER — Ambulatory Visit (INDEPENDENT_AMBULATORY_CARE_PROVIDER_SITE_OTHER): Admitting: Family Medicine

## 2024-10-20 ENCOUNTER — Encounter (INDEPENDENT_AMBULATORY_CARE_PROVIDER_SITE_OTHER): Payer: Self-pay | Admitting: Family Medicine

## 2024-10-20 VITALS — BP 130/72 | HR 75 | Temp 98.3°F | Ht 64.0 in | Wt 147.0 lb

## 2024-10-20 DIAGNOSIS — Z7985 Long-term (current) use of injectable non-insulin antidiabetic drugs: Secondary | ICD-10-CM | POA: Diagnosis not present

## 2024-10-20 DIAGNOSIS — I10 Essential (primary) hypertension: Secondary | ICD-10-CM | POA: Diagnosis not present

## 2024-10-20 DIAGNOSIS — E559 Vitamin D deficiency, unspecified: Secondary | ICD-10-CM

## 2024-10-20 DIAGNOSIS — R5383 Other fatigue: Secondary | ICD-10-CM | POA: Diagnosis not present

## 2024-10-20 DIAGNOSIS — E669 Obesity, unspecified: Secondary | ICD-10-CM

## 2024-10-20 DIAGNOSIS — Z6825 Body mass index (BMI) 25.0-25.9, adult: Secondary | ICD-10-CM | POA: Diagnosis not present

## 2024-10-20 DIAGNOSIS — E1169 Type 2 diabetes mellitus with other specified complication: Secondary | ICD-10-CM | POA: Diagnosis not present

## 2024-10-20 DIAGNOSIS — Z794 Long term (current) use of insulin: Secondary | ICD-10-CM | POA: Diagnosis not present

## 2024-10-20 MED ORDER — TELMISARTAN 20 MG PO TABS: 20.0000 mg | ORAL_TABLET | Freq: Every day | ORAL | 0 refills | Status: AC

## 2024-10-20 MED ORDER — CHOLECALCIFEROL 1.25 MG (50000 UT) PO TABS: 50000.0000 [IU] | ORAL_TABLET | ORAL | 0 refills | Status: AC

## 2024-10-20 MED ORDER — TIRZEPATIDE 5 MG/0.5ML ~~LOC~~ SOAJ: 5.0000 mg | SUBCUTANEOUS | 0 refills | Status: AC

## 2024-10-20 MED ORDER — ESCITALOPRAM OXALATE 10 MG PO TABS: 10.0000 mg | ORAL_TABLET | Freq: Every day | ORAL | 0 refills | Status: AC

## 2024-10-20 NOTE — Progress Notes (Signed)
 "  Office: [phone]  /  Fax: [phone]  WEIGHT SUMMARY AND BIOMETRICS  Anthropometric Measurements Height: 5' 4 (1.626 m) Weight: 147 lb (66.7 kg) BMI (Calculated): 25.22 Weight at Last Visit: 148 lb Weight Lost Since Last Visit: 1 lb Weight Gained Since Last Visit: 0 Starting Weight: 203 lb Total Weight Loss (lbs): 56 lb (25.4 kg) Peak Weight: 240 lb   Body Composition  Body Fat %: 32.5 % Fat Mass (lbs): 48 lbs Muscle Mass (lbs): 94.4 lbs Total Body Water (lbs): 65.2 lbs Visceral Fat Rating : 8   Other Clinical Data Fasting: no Labs: no Today's Visit #: 40 Starting Date: 06/22/21    Chief Complaint: OBESITY   History of Present Illness Holly Daniels is a 69 year old female with obesity who presents for a follow-up on her obesity treatment and progress.  She is adhering to the category three eating plan only 40% of the time and is not engaging in any exercise. She is attempting to increase her hydration and intake of fruits and vegetables but is not consuming the recommended amount of protein. She skips meals and does not achieve the recommended seven to nine hours of sleep per night. Since her last visit three months ago, she has lost one pound.  She is experiencing significant stress and exhaustion due to her husband's recent illness. Her husband contracted the flu in December, leading to a prolonged recovery and hospitalization. He is now home but requires significant care, impacting her physical health and resulting in a rotator cuff injury from lifting him. She describes the pain as 'excruciatingly painful' and manages daily activities with difficulty, including driving with one arm.  Her husband's condition has led to changes in her living situation. She is considering selling her house to move to a smaller, more manageable space due to the challenges of caring for him on the second floor. She has requested a hospital bed to assist with his care but has not yet  received it. She is also exploring options for additional family support and potential relocation to be closer to family.  She reports emotional eating, particularly consuming junk food and Oreo cookies, as a coping mechanism for stress. Despite this, she has not gained weight but acknowledges the lack of proper nutrition. She is taking her medications, including Lexapro , Micardis , and Mounjaro , consistently but is concerned about her overall health and ability to maintain her current lifestyle.  She has not had recent labs due to illness. She is open to exploring meal planning and preparation strategies to improve her nutrition amidst her current challenges.      PHYSICAL EXAM:  Blood pressure 130/72, pulse 75, temperature 98.3 F (36.8 C), height 5' 4 (1.626 m), weight 147 lb (66.7 kg), SpO2 96%. Body mass index is 25.23 kg/m.  DIAGNOSTIC DATA REVIEWED BY MYSELF TODAY:  BMET    Component Value Date/Time   NA 143 10/31/2023 1200   K 4.2 10/31/2023 1200   CL 106 10/31/2023 1200   CO2 20 10/31/2023 1200   GLUCOSE 119 (H) 10/31/2023 1200   GLUCOSE 244 (H) 12/05/2015 1135   BUN 14 10/31/2023 1200   CREATININE 0.82 10/31/2023 1200   CALCIUM 9.7 10/31/2023 1200   GFRNONAA >60 12/05/2015 1135   GFRAA >60 12/05/2015 1135   Lab Results  Component Value Date   HGBA1C 6.8 (H) 10/31/2023   HGBA1C 9.1 (H) 12/05/2015   Lab Results  Component Value Date   INSULIN  28.0 (H) 10/31/2023   Lab  Results  Component Value Date   TSH 1.440 10/31/2023   CBC    Component Value Date/Time   WBC 8.2 11/29/2022 0000   WBC 10.1 01/04/2012 1355   RBC 4.70 11/29/2022 0000   HGB 14.1 11/29/2022 0000   HGB 16.2 (H) 06/22/2021 1020   HCT 43 11/29/2022 0000   HCT 47.9 (H) 06/22/2021 1020   PLT 186 11/29/2022 0000   PLT 183 06/22/2021 1020   MCV 89 06/22/2021 1020   MCH 29.9 06/22/2021 1020   MCH 30.2 01/04/2012 1355   MCHC 33.8 06/22/2021 1020   MCHC 34.5 01/04/2012 1355   RDW 14.8  06/22/2021 1020   Iron Studies No results found for: IRON, TIBC, FERRITIN, IRONPCTSAT Lipid Panel     Component Value Date/Time   CHOL 118 10/31/2023 1200   TRIG 117 10/31/2023 1200   HDL 35 (L) 10/31/2023 1200   CHOLHDL 5.5 12/05/2015 1135   VLDL 35 12/05/2015 1135   LDLCALC 62 10/31/2023 1200   Hepatic Function Panel     Component Value Date/Time   PROT 6.6 10/31/2023 1200   ALBUMIN 4.3 10/31/2023 1200   AST 23 10/31/2023 1200   ALT 43 (H) 10/31/2023 1200   ALKPHOS 116 10/31/2023 1200   BILITOT 0.3 10/31/2023 1200   BILIDIR 0.1 11/29/2022 0000   IBILI NOT CALCULATED 12/05/2015 1135      Component Value Date/Time   TSH 1.440 10/31/2023 1200   Nutritional Lab Results  Component Value Date   VD25OH 34.1 10/31/2023   VD25OH 19 11/29/2022   VD25OH 15.4 (L) 06/22/2021     Assessment and Plan Assessment & Plan Obesity Management is ongoing with a focus on dietary changes. She is following the category three eating plan 40% of the time, not exercising, and not meeting protein intake recommendations, potentially affecting her resting metabolic rate. She has lost one pound since the last visit three months ago. Emotional eating and skipping meals are contributing factors. - Encouraged adherence to the category three eating plan. - Advised increasing protein intake to support metabolic rate. - Discussed meal planning and preparation strategies to improve dietary habits. - Encouraged reduction of emotional eating behaviors.  Type 2 diabetes mellitus with other specified complication Type 2 diabetes management is ongoing. She is taking Mounjaro  as prescribed. - Refilled Mounjaro  prescription. - Ordered labs to monitor diabetes management. - Continue diet, exercise and weight loss as discussed today as an important part of the treatment plan   Hypertension Well controlled with a blood pressure reading of 130/72 mmHg. She is taking Micardis  as prescribed. -  Refilled Micardis  prescription. - Ordered labs to check renal function. - Continue diet, exercise and weight loss as discussed today as an important part of the treatment plan   Vitamin D  deficiency Management requires follow-up. She has not had recent labs due to illness. - Refilled vitamin D  prescription. - Ordered labs to assess vitamin D  levels.  Caregiver fatigue Significant caregiver fatigue due to caregiving responsibilities for her husband, who has experienced a severe illness and requires extensive care. She is experiencing physical strain and emotional stress, impacting her ability to manage her own health. - Support and encouragement offered - Importance of self care stressed      Patients who are on anti-obesity medications are counseled on the importance of maintaining healthy lifestyle habits, including balanced nutrition, regular physical activity, and behavioral modifications,  Medication is an adjunct to, not a replacement for, lifestyle changes and that the long-term success and  weight maintenance depend on continued adherence to these strategies.   Loriana was informed of the importance of frequent follow up visits to maximize her success with intensive lifestyle modifications for her obesity and obesity related health conditions as recommended by USPSTF and CMS guidelines  Louann Penton, MD   "

## 2024-10-20 NOTE — Addendum Note (Signed)
 Addended by: LAFE BAKER CROME on: 10/20/2024 04:07 PM   Modules accepted: Level of Service

## 2024-10-21 LAB — CMP14+EGFR
ALT: 47 IU/L — ABNORMAL HIGH (ref 0–32)
AST: 32 IU/L (ref 0–40)
Albumin: 4.5 g/dL (ref 3.9–4.9)
Alkaline Phosphatase: 107 IU/L (ref 49–135)
BUN/Creatinine Ratio: 24 (ref 12–28)
BUN: 20 mg/dL (ref 8–27)
Bilirubin Total: 0.3 mg/dL (ref 0.0–1.2)
CO2: 21 mmol/L (ref 20–29)
Calcium: 9.6 mg/dL (ref 8.7–10.3)
Chloride: 104 mmol/L (ref 96–106)
Creatinine, Ser: 0.83 mg/dL (ref 0.57–1.00)
Globulin, Total: 2.3 g/dL (ref 1.5–4.5)
Glucose: 118 mg/dL — ABNORMAL HIGH (ref 70–99)
Potassium: 4.1 mmol/L (ref 3.5–5.2)
Sodium: 141 mmol/L (ref 134–144)
Total Protein: 6.8 g/dL (ref 6.0–8.5)
eGFR: 76 mL/min/1.73

## 2024-10-21 LAB — TSH: TSH: 0.666 u[IU]/mL (ref 0.450–4.500)

## 2024-10-21 LAB — LIPID PANEL WITH LDL/HDL RATIO
Cholesterol, Total: 120 mg/dL (ref 100–199)
HDL: 38 mg/dL — ABNORMAL LOW
LDL Chol Calc (NIH): 64 mg/dL (ref 0–99)
LDL/HDL Ratio: 1.7 ratio (ref 0.0–3.2)
Triglycerides: 92 mg/dL (ref 0–149)
VLDL Cholesterol Cal: 18 mg/dL (ref 5–40)

## 2024-10-21 LAB — VITAMIN B12: Vitamin B-12: 364 pg/mL (ref 232–1245)

## 2024-10-21 LAB — INSULIN, RANDOM: INSULIN: 38.2 u[IU]/mL — ABNORMAL HIGH (ref 2.6–24.9)

## 2024-10-21 LAB — VITAMIN D 25 HYDROXY (VIT D DEFICIENCY, FRACTURES): Vit D, 25-Hydroxy: 33.8 ng/mL (ref 30.0–100.0)

## 2024-10-21 LAB — HEMOGLOBIN A1C
Est. average glucose Bld gHb Est-mCnc: 134 mg/dL
Hgb A1c MFr Bld: 6.3 % — ABNORMAL HIGH (ref 4.8–5.6)

## 2024-10-21 NOTE — Progress Notes (Signed)
 " [facility] - Clinic Note  10/28/2024   CHIEF COMPLAINT Patient presents for Retina Evaluation  HISTORY OF PRESENT ILLNESS: Holly Daniels is a 69 y.o. female who presents to the clinic today for:  HPI     Retina Evaluation   In both eyes.  This started 1 month ago.  Duration of 1 month.        Comments   1 month retina follow up mac tel/EMD OD pt is reporting no vision changes noticed she denies any flashes or floaters pt is diabetic  120 range A1C 6.4 3 months       Last edited by Resa Delon ORN, COT on 10/28/2024  1:21 PM.     Patient states she is seeing well.   Referring physician: Frazier, Chad, OD [address] [place],  [place] [address]  HISTORICAL INFORMATION:  Selected notes from the MEDICAL RECORD NUMBER Referred by Dr. Vivian LEE:  Ocular Hx- Mac Tel 2 vs. ERM PMH- Diabetes, HBP   CURRENT MEDICATIONS: Current Outpatient Medications (Ophthalmic Drugs)  Medication Sig   ketorolac  (ACULAR ) 0.4 % SOLN Place 1 drop into the left eye 4 (four) times daily.   No current facility-administered medications for this visit. (Ophthalmic Drugs)   Current Outpatient Medications (Other)  Medication Sig   amLODipine (NORVASC) 5 MG tablet Take 5 mg by mouth every morning.   aspirin  EC (ASPIRIN  LOW DOSE) 81 MG tablet TAKE 1 TABLET(81 MG) BY MOUTH DAILY. SWALLOW WHOLE   Cholecalciferol  1.25 MG (50000 UT) TABS Take 50,000 Units by mouth once a week.   cyclobenzaprine (FEXMID) 7.5 MG tablet Take 7.5 mg by mouth once.   escitalopram  (LEXAPRO ) 10 MG tablet Take 1 tablet (10 mg total) by mouth daily.   ezetimibe (ZETIA) 10 MG tablet Take 10 mg by mouth daily.   Finerenone (KERENDIA) 10 MG TABS Kerendia 10 mg tablet  Take 1 tablet every day by oral route for 30 days.   fluticasone  (FLONASE ) 50 MCG/ACT nasal spray Place 2 sprays into both nostrils daily.   ibuprofen  (ADVIL ) 800 MG tablet Take 800 mg by mouth 4 (four) times daily.   Loratadine   10 MG CAPS Take 1 capsule (10 mg total) by mouth daily at 6 (six) AM.   montelukast  (SINGULAIR ) 10 MG tablet Take 1 tablet (10 mg total) by mouth at bedtime.   nitroGLYCERIN  (NITROSTAT ) 0.4 MG SL tablet Place 0.4 mg under the tongue every 5 (five) minutes as needed. For chest pains, never needed but has for emergency purpose   oxymetazoline (AFRIN) 0.05 % nasal spray Place 2 sprays into the nose 2 (two) times daily as needed. For congestion   pantoprazole  (PROTONIX ) 40 MG tablet Take 40 mg by mouth daily.   rosuvastatin (CRESTOR) 40 MG tablet Take 40 mg by mouth daily.   SYNJARDY XR 12.01-999 MG TB24 Take by mouth.   telmisartan  (MICARDIS ) 20 MG tablet Take 1 tablet (20 mg total) by mouth daily.   tirzepatide  (MOUNJARO ) 5 MG/0.5ML Pen Inject 5 mg into the skin once a week.   No current facility-administered medications for this visit. (Other)   REVIEW OF SYSTEMS: ROS   Positive for: Endocrine, Cardiovascular, Eyes Last edited by Resa Delon ORN, COT on 10/28/2024  1:21 PM.     ALLERGIES Allergies[1] PAST MEDICAL HISTORY Past Medical History:  Diagnosis Date   Anxiety    Back pain    Diabetes mellitus    oral meds   Fibromyalgia  GERD (gastroesophageal reflux disease)    Headache(784.0)    sinus   High cholesterol    History of C-section    x2   Hypertension    Joint pain    Neuropathy    OSA (obstructive sleep apnea)    Sciatica    Sleep apnea    Past Surgical History:  Procedure Laterality Date   CESAREAN SECTION     x2   CHOLECYSTECTOMY     CHONDROPLASTY Left 09/29/2015   Procedure: CHONDROPLASTY;  Surgeon: Dempsey Sensor, MD;  Location: [facility];  Service: Orthopedics;  Laterality: Left;   HYSTEROSCOPY WITH D & C  01/14/2012   Procedure: DILATATION AND CURETTAGE /HYSTEROSCOPY;  Surgeon: Jon CINDERELLA Rummer, MD;  Location: [facility];  Service: Gynecology;  Laterality: N/A;   KNEE ARTHROSCOPY WITH MEDIAL MENISECTOMY Left 09/29/2015   Procedure: KNEE  ARTHROSCOPY WITH MEDIAL MENISECTOMY AND CHONDROPLASTY;  Surgeon: Dempsey Sensor, MD;  Location: [facility];  Service: Orthopedics;  Laterality: Left;   vulvar cyst     FAMILY HISTORY Family History  Problem Relation Age of Onset   Alcoholism Mother    Cancer Father        colon cancer   Colon cancer Father    Cancer Sister        brain and breast cancer   Breast cancer Sister    Cancer Sister        breast cancer   Breast cancer Sister    SOCIAL HISTORY Social History[2]     OPHTHALMIC EXAM:  Base Eye Exam     Visual Acuity (Snellen - Linear)       Right Left   Dist cc 20/20 20/20 -1    Correction: Glasses         Tonometry (Tonopen, 1:26 PM)       Right Left   Pressure 16 18         Pupils       Pupils Dark Light Shape React APD   Right PERRL 3 2 Round Brisk None   Left PERRL 3 2 Round Brisk None         Visual Fields       Left Right    Full Full         Extraocular Movement       Right Left    Full, Ortho Full, Ortho         Neuro/Psych     Oriented x3: Yes   Mood/Affect: Normal         Dilation     Both eyes: 2.5% Phenylephrine @ 1:26 PM           Slit Lamp and Fundus Exam     External Exam       Right Left   External Normal Normal         Slit Lamp Exam       Right Left   Lids/Lashes Normal Normal   Conjunctiva/Sclera White and quiet White and quiet   Cornea Clear Clear   Anterior Chamber Deep and quiet Deep and quiet   Iris Round and reactive Round and reactive   Lens Clear Clear   Anterior Vitreous Normal Normal         Fundus Exam       Right Left   Disc Normal Normal   Macula Normal Normal   Vessels Normal Normal   Periphery Normal Normal  Refraction     Wearing Rx       Sphere Cylinder Axis Add   Right -1.25 +1.50 169 +2.50   Left -1.75 +1.75 175 +2.50           IMAGING AND PROCEDURES  Imaging and Procedures for 10/28/2024  OCT, Retina - OU - Both  Eyes       Right Eye Quality was good. Central Foveal Thickness: 260. Progression has no prior data. Findings include normal foveal contour, no SRF, intraretinal hyper-reflective material (Focal IRF/IRHM/edema temporal mac).   Left Eye Quality was good. Central Foveal Thickness: 256. Progression has no prior data. Findings include normal foveal contour, no IRF, no SRF (No DME).   Notes *Images captured and stored on drive  Diagnosis / Impression:  OD: Focal IRF/IRHM/edema temporal mac--+ DME OS: no DME Clinical management:  See below  Abbreviations: NFP - Normal foveal profile. CME - cystoid macular edema. PED - pigment epithelial detachment. IRF - intraretinal fluid. SRF - subretinal fluid. EZ - ellipsoid zone. ERM - epiretinal membrane. ORA - outer retinal atrophy. ORT - outer retinal tubulation. SRHM - subretinal hyper-reflective material. IRHM - intraretinal hyper-reflective material      Fluorescein Angiography Optos (Transit OD)       Right Eye Progression has no prior data.   Left Eye Progression has no prior data.   Notes *Images captured and stored on drive  Diagnosis / Impression:  OD: OS:  Clinical management:  See below  Abbreviations: NFP - Normal foveal profile. CME - cystoid macular edema. PED - pigment epithelial detachment. IRF - intraretinal fluid. SRF - subretinal fluid. EZ - ellipsoid zone. ERM - epiretinal membrane. ORA - outer retinal atrophy. ORT - outer retinal tubulation. SRHM - subretinal hyper-reflective material. IRHM - intraretinal hyper-reflective material           ASSESSMENT/PLAN:   ICD-10-CM   1. Moderate nonproliferative diabetic retinopathy of both eyes with macular edema associated with diabetes mellitus due to underlying condition (HCC)  E08.3313 OCT, Retina - OU - Both Eyes    Fluorescein Angiography Optos (Transit OD)    2. Diabetes mellitus treated with injections of non-insulin  medication (HCC)  E11.9    Z79.85     3.  Hypertension, essential  I10     4. Hypertensive retinopathy of both eyes  H35.033      1,2. moderate Non-proliferative diabetic retinopathy, both eyes  - A1C 6.3 (01.20.26) - BCVA 20/20 OU - The natural history, pathology, and characteristics of diabetic macular edema discussed with patient.  A generalized discussion of the major clinical trials concerning treatment of diabetic macular edema (ETDRS, DCT, SCORE, RISE / RIDE, and ongoing DRCR net studies) was completed.  This discussion included mention of the various approaches to treating diabetic macular edema (observation, laser photocoagulation, anti-VEGF injections with lucentis / Avastin / Eylea, steroid injections with Kenalog / Ozurdex , and intraocular surgery with vitrectomy).  The goal hemoglobin A1C of 6-7 was discussed, as well as importance of smoking cessation and hypertension control.  Need for ongoing treatment and monitoring were specifically discussed with reference to chronic nature of diabetic macular edema. - OCT shows OD: Focal IRF/IRHM/edema temporal mac--+DME, OS: no DME - FA shows OD: Scattered late leaking MA greatest temporal macula, mild peripheral non profusion, no NV, OS: Scattered late leaking MA greatest temporal macula, mild patches of peripheral non profusion, no NV - recommend focal laser OD - will schedule for another date - pt wishes to proceed -  RBA of procedure discussed, questions answered - informed consent obtained and signed - see procedure note - f/u in within 2-4 weeks for possible focal laser OD 945a or 2:45a  3,4. Hypertensive retinopathy OU - discussed importance of tight BP control - monitor   5. Mixed Cataract OU - The symptoms of cataract, surgical options, and treatments and risks were discussed with patient. - discussed diagnosis and progression - under the expert care of Dr. Vivian - monitor   Ophthalmic Meds Ordered this visit:  No orders of the defined types were placed in this  encounter.    No follow-ups on file.  There are no Patient Instructions on file for this visit.  Explained the diagnoses, plan, and follow up with the patient and they expressed understanding.  Patient expressed understanding of the importance of proper follow up care.   This document serves as a record of services personally performed by Redell JUDITHANN Hans, MD, PhD. It was created on their behalf by Almetta Pesa, an ophthalmic technician. The creation of this record is the provider's dictation and/or activities during the visit.    Electronically signed by: Almetta Pesa, OA, 10/28/24  2:20 PM  This document serves as a record of services personally performed by Redell JUDITHANN Hans, MD, PhD. It was created on their behalf by Wanda GEANNIE Keens, COT an ophthalmic technician. The creation of this record is the provider's dictation and/or activities during the visit.    Electronically signed by:  Wanda GEANNIE Keens, COT  10/28/24 2:20 PM   Redell JUDITHANN Hans, M.D., Ph.D. Diseases & Surgery of the Retina and Vitreous [facility] 10/28/2024  Abbreviations: M myopia (nearsighted); A astigmatism; H hyperopia (farsighted); P presbyopia; Mrx spectacle prescription;  CTL contact lenses; OD right eye; OS left eye; OU both eyes  XT exotropia; ET esotropia; PEK punctate epithelial keratitis; PEE punctate epithelial erosions; DES dry eye syndrome; MGD meibomian gland dysfunction; ATs artificial tears; PFAT's preservative free artificial tears; NSC nuclear sclerotic cataract; PSC posterior subcapsular cataract; ERM epi-retinal membrane; PVD posterior vitreous detachment; RD retinal detachment; DM diabetes mellitus; DR diabetic retinopathy; NPDR non-proliferative diabetic retinopathy; PDR proliferative diabetic retinopathy; CSME clinically significant macular edema; DME diabetic macular edema; dbh dot blot hemorrhages; CWS cotton wool spot; POAG primary open angle glaucoma; C/D  cup-to-disc ratio; HVF humphrey visual field; GVF goldmann visual field; OCT optical coherence tomography; IOP intraocular pressure; BRVO Branch retinal vein occlusion; CRVO central retinal vein occlusion; CRAO central retinal artery occlusion; BRAO branch retinal artery occlusion; RT retinal tear; SB scleral buckle; PPV pars plana vitrectomy; VH Vitreous hemorrhage; PRP panretinal laser photocoagulation; IVK intravitreal kenalog; VMT vitreomacular traction; MH Macular hole;  NVD neovascularization of the disc; NVE neovascularization elsewhere; AREDS age related eye disease study; ARMD age related macular degeneration; POAG primary open angle glaucoma; EBMD epithelial/anterior basement membrane dystrophy; ACIOL anterior chamber intraocular lens; IOL intraocular lens; PCIOL posterior chamber intraocular lens; Phaco/IOL phacoemulsification with intraocular lens placement; PRK photorefractive keratectomy; LASIK laser assisted in situ keratomileusis; HTN hypertension; DM diabetes mellitus; COPD chronic obstructive pulmonary disease      [1]  Allergies Allergen Reactions   Methylprednisolone Palpitations   Lisinopril     Other reaction(s): Angioedema   Prednisone Nausea And Vomiting    Heart racing, sweating   Topiramate     Other reaction(s): Other   Codeine Nausea And Vomiting   Lactose Intolerance (Gi) Other (See Comments)    Cause GI upset  [2]  Social History Tobacco  Use   Smoking status: Some Days    Current packs/day: 0.00    Types: Cigarettes   Smokeless tobacco: Never  Substance Use Topics   Alcohol use: No   Drug use: No   "

## 2024-10-28 ENCOUNTER — Ambulatory Visit (INDEPENDENT_AMBULATORY_CARE_PROVIDER_SITE_OTHER): Admitting: Ophthalmology

## 2024-10-28 ENCOUNTER — Encounter (INDEPENDENT_AMBULATORY_CARE_PROVIDER_SITE_OTHER): Payer: Self-pay | Admitting: Ophthalmology

## 2024-10-28 VITALS — BP 111/75 | HR 82

## 2024-10-28 DIAGNOSIS — E113313 Type 2 diabetes mellitus with moderate nonproliferative diabetic retinopathy with macular edema, bilateral: Secondary | ICD-10-CM | POA: Diagnosis not present

## 2024-10-28 DIAGNOSIS — H3581 Retinal edema: Secondary | ICD-10-CM

## 2024-10-28 DIAGNOSIS — Z7985 Long-term (current) use of injectable non-insulin antidiabetic drugs: Secondary | ICD-10-CM | POA: Diagnosis not present

## 2024-10-28 DIAGNOSIS — I10 Essential (primary) hypertension: Secondary | ICD-10-CM | POA: Diagnosis not present

## 2024-10-28 DIAGNOSIS — H35033 Hypertensive retinopathy, bilateral: Secondary | ICD-10-CM | POA: Diagnosis not present

## 2024-10-28 DIAGNOSIS — H25813 Combined forms of age-related cataract, bilateral: Secondary | ICD-10-CM

## 2024-10-28 DIAGNOSIS — E119 Type 2 diabetes mellitus without complications: Secondary | ICD-10-CM

## 2024-11-18 ENCOUNTER — Ambulatory Visit (INDEPENDENT_AMBULATORY_CARE_PROVIDER_SITE_OTHER): Admitting: Family Medicine

## 2024-11-23 ENCOUNTER — Encounter (INDEPENDENT_AMBULATORY_CARE_PROVIDER_SITE_OTHER): Admitting: Ophthalmology
